# Patient Record
Sex: Male | Born: 1945 | Race: White | Hispanic: No | Marital: Married | Smoking: Former smoker
Health system: Southern US, Community
[De-identification: ages and names within clinical notes are randomized; demographics above are authoritative.]

## PROBLEM LIST (undated history)

## (undated) DIAGNOSIS — C61 Malignant neoplasm of prostate: Secondary | ICD-10-CM

## (undated) DIAGNOSIS — R011 Cardiac murmur, unspecified: Secondary | ICD-10-CM

## (undated) DIAGNOSIS — K589 Irritable bowel syndrome without diarrhea: Secondary | ICD-10-CM

## (undated) DIAGNOSIS — N200 Calculus of kidney: Secondary | ICD-10-CM

## (undated) DIAGNOSIS — R413 Other amnesia: Secondary | ICD-10-CM

## (undated) DIAGNOSIS — G4730 Sleep apnea, unspecified: Secondary | ICD-10-CM

## (undated) DIAGNOSIS — M1990 Unspecified osteoarthritis, unspecified site: Secondary | ICD-10-CM

## (undated) DIAGNOSIS — K219 Gastro-esophageal reflux disease without esophagitis: Secondary | ICD-10-CM

## (undated) DIAGNOSIS — T7840XA Allergy, unspecified, initial encounter: Secondary | ICD-10-CM

## (undated) DIAGNOSIS — F32A Depression, unspecified: Secondary | ICD-10-CM

## (undated) DIAGNOSIS — E785 Hyperlipidemia, unspecified: Secondary | ICD-10-CM

## (undated) DIAGNOSIS — F329 Major depressive disorder, single episode, unspecified: Secondary | ICD-10-CM

## (undated) DIAGNOSIS — K635 Polyp of colon: Secondary | ICD-10-CM

## (undated) DIAGNOSIS — H269 Unspecified cataract: Secondary | ICD-10-CM

## (undated) HISTORY — DX: Malignant neoplasm of prostate: C61

## (undated) HISTORY — DX: Unspecified cataract: H26.9

## (undated) HISTORY — DX: Polyp of colon: K63.5

## (undated) HISTORY — DX: Allergy, unspecified, initial encounter: T78.40XA

## (undated) HISTORY — PX: CARPAL TUNNEL RELEASE: SHX101

## (undated) HISTORY — DX: Gastro-esophageal reflux disease without esophagitis: K21.9

## (undated) HISTORY — PX: CERVICAL FUSION: SHX112

## (undated) HISTORY — DX: Calculus of kidney: N20.0

## (undated) HISTORY — PX: TONSILLECTOMY: SUR1361

## (undated) HISTORY — DX: Sleep apnea, unspecified: G47.30

## (undated) HISTORY — DX: Cardiac murmur, unspecified: R01.1

## (undated) HISTORY — DX: Hyperlipidemia, unspecified: E78.5

## (undated) HISTORY — DX: Depression, unspecified: F32.A

## (undated) HISTORY — PX: COLONOSCOPY: SHX174

## (undated) HISTORY — PX: HERNIA REPAIR: SHX51

## (undated) HISTORY — DX: Irritable bowel syndrome, unspecified: K58.9

## (undated) HISTORY — DX: Other amnesia: R41.3

## (undated) HISTORY — DX: Unspecified osteoarthritis, unspecified site: M19.90

---
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
## 2000-03-13 ENCOUNTER — Encounter (INDEPENDENT_AMBULATORY_CARE_PROVIDER_SITE_OTHER): Payer: Self-pay | Admitting: Specialist

## 2000-03-13 ENCOUNTER — Other Ambulatory Visit: Admission: RE | Admit: 2000-03-13 | Discharge: 2000-03-13 | Payer: Self-pay | Admitting: Urology

## 2000-08-04 HISTORY — PX: COLONOSCOPY W/ POLYPECTOMY: SHX1380

## 2001-07-12 ENCOUNTER — Encounter: Admission: RE | Admit: 2001-07-12 | Discharge: 2001-07-12 | Payer: Self-pay | Admitting: Internal Medicine

## 2001-07-12 ENCOUNTER — Encounter: Payer: Self-pay | Admitting: Internal Medicine

## 2001-07-13 ENCOUNTER — Encounter: Admission: RE | Admit: 2001-07-13 | Discharge: 2001-07-13 | Payer: Self-pay | Admitting: Internal Medicine

## 2001-07-13 ENCOUNTER — Encounter: Payer: Self-pay | Admitting: Internal Medicine

## 2001-08-04 DIAGNOSIS — C61 Malignant neoplasm of prostate: Secondary | ICD-10-CM

## 2001-08-04 HISTORY — DX: Malignant neoplasm of prostate: C61

## 2001-08-04 HISTORY — PX: PROSTATECTOMY: SHX69

## 2001-09-06 ENCOUNTER — Ambulatory Visit (HOSPITAL_BASED_OUTPATIENT_CLINIC_OR_DEPARTMENT_OTHER): Admission: RE | Admit: 2001-09-06 | Discharge: 2001-09-06 | Payer: Self-pay | Admitting: Internal Medicine

## 2001-10-07 ENCOUNTER — Encounter: Payer: Self-pay | Admitting: Neurosurgery

## 2001-10-08 ENCOUNTER — Ambulatory Visit (HOSPITAL_COMMUNITY): Admission: RE | Admit: 2001-10-08 | Discharge: 2001-10-08 | Payer: Self-pay | Admitting: Neurosurgery

## 2001-11-03 ENCOUNTER — Encounter: Payer: Self-pay | Admitting: Neurosurgery

## 2001-11-03 ENCOUNTER — Observation Stay (HOSPITAL_COMMUNITY): Admission: RE | Admit: 2001-11-03 | Discharge: 2001-11-04 | Payer: Self-pay | Admitting: Neurosurgery

## 2001-12-06 ENCOUNTER — Encounter: Admission: RE | Admit: 2001-12-06 | Discharge: 2001-12-06 | Payer: Self-pay | Admitting: Urology

## 2001-12-06 ENCOUNTER — Encounter: Payer: Self-pay | Admitting: Urology

## 2001-12-10 ENCOUNTER — Ambulatory Visit: Admission: RE | Admit: 2001-12-10 | Discharge: 2002-03-10 | Payer: Self-pay | Admitting: Radiation Oncology

## 2002-01-18 ENCOUNTER — Inpatient Hospital Stay (HOSPITAL_COMMUNITY): Admission: RE | Admit: 2002-01-18 | Discharge: 2002-01-21 | Payer: Self-pay | Admitting: Urology

## 2002-01-18 ENCOUNTER — Encounter (INDEPENDENT_AMBULATORY_CARE_PROVIDER_SITE_OTHER): Payer: Self-pay

## 2002-03-10 ENCOUNTER — Encounter: Admission: RE | Admit: 2002-03-10 | Discharge: 2002-03-10 | Payer: Self-pay | Admitting: Urology

## 2002-03-10 ENCOUNTER — Encounter: Payer: Self-pay | Admitting: Urology

## 2002-04-08 ENCOUNTER — Ambulatory Visit: Admission: RE | Admit: 2002-04-08 | Discharge: 2002-06-14 | Payer: Self-pay | Admitting: Radiation Oncology

## 2003-08-15 ENCOUNTER — Ambulatory Visit (HOSPITAL_COMMUNITY): Admission: RE | Admit: 2003-08-15 | Discharge: 2003-08-15 | Payer: Self-pay | Admitting: Urology

## 2003-11-16 ENCOUNTER — Encounter: Admission: RE | Admit: 2003-11-16 | Discharge: 2003-11-16 | Payer: Self-pay | Admitting: Internal Medicine

## 2004-04-16 ENCOUNTER — Ambulatory Visit (HOSPITAL_COMMUNITY): Admission: RE | Admit: 2004-04-16 | Discharge: 2004-04-17 | Payer: Self-pay | Admitting: Orthopedic Surgery

## 2004-06-14 ENCOUNTER — Ambulatory Visit: Payer: Self-pay | Admitting: Internal Medicine

## 2004-10-10 ENCOUNTER — Ambulatory Visit: Payer: Self-pay | Admitting: Internal Medicine

## 2005-02-14 ENCOUNTER — Ambulatory Visit: Payer: Self-pay | Admitting: Internal Medicine

## 2005-05-12 ENCOUNTER — Ambulatory Visit: Payer: Self-pay | Admitting: Internal Medicine

## 2005-06-20 ENCOUNTER — Ambulatory Visit: Payer: Self-pay | Admitting: Internal Medicine

## 2005-07-08 ENCOUNTER — Ambulatory Visit: Payer: Self-pay | Admitting: Gastroenterology

## 2005-07-08 ENCOUNTER — Ambulatory Visit: Payer: Self-pay | Admitting: Internal Medicine

## 2005-11-06 ENCOUNTER — Ambulatory Visit: Payer: Self-pay | Admitting: Internal Medicine

## 2006-01-20 ENCOUNTER — Ambulatory Visit: Payer: Self-pay | Admitting: Internal Medicine

## 2006-01-28 ENCOUNTER — Ambulatory Visit: Payer: Self-pay | Admitting: Internal Medicine

## 2006-02-12 ENCOUNTER — Ambulatory Visit: Payer: Self-pay | Admitting: Internal Medicine

## 2006-03-13 ENCOUNTER — Ambulatory Visit: Payer: Self-pay | Admitting: Gastroenterology

## 2006-03-27 ENCOUNTER — Ambulatory Visit: Payer: Self-pay | Admitting: Gastroenterology

## 2006-04-10 ENCOUNTER — Ambulatory Visit: Payer: Self-pay | Admitting: Internal Medicine

## 2006-08-04 HISTORY — PX: COLONOSCOPY: SHX174

## 2006-09-23 DIAGNOSIS — D649 Anemia, unspecified: Secondary | ICD-10-CM | POA: Insufficient documentation

## 2006-12-04 ENCOUNTER — Ambulatory Visit: Payer: Self-pay | Admitting: Internal Medicine

## 2007-02-10 ENCOUNTER — Telehealth (INDEPENDENT_AMBULATORY_CARE_PROVIDER_SITE_OTHER): Payer: Self-pay | Admitting: *Deleted

## 2007-03-23 ENCOUNTER — Encounter: Payer: Self-pay | Admitting: Internal Medicine

## 2007-03-23 DIAGNOSIS — G43819 Other migraine, intractable, without status migrainosus: Secondary | ICD-10-CM

## 2007-04-06 ENCOUNTER — Telehealth (INDEPENDENT_AMBULATORY_CARE_PROVIDER_SITE_OTHER): Payer: Self-pay | Admitting: *Deleted

## 2007-04-26 ENCOUNTER — Ambulatory Visit: Payer: Self-pay | Admitting: Internal Medicine

## 2007-05-03 ENCOUNTER — Telehealth: Payer: Self-pay | Admitting: Internal Medicine

## 2007-05-10 ENCOUNTER — Telehealth (INDEPENDENT_AMBULATORY_CARE_PROVIDER_SITE_OTHER): Payer: Self-pay | Admitting: *Deleted

## 2007-05-18 ENCOUNTER — Ambulatory Visit: Payer: Self-pay | Admitting: Internal Medicine

## 2007-05-18 DIAGNOSIS — F411 Generalized anxiety disorder: Secondary | ICD-10-CM | POA: Insufficient documentation

## 2007-05-19 ENCOUNTER — Ambulatory Visit: Payer: Self-pay | Admitting: Internal Medicine

## 2007-05-21 ENCOUNTER — Encounter (INDEPENDENT_AMBULATORY_CARE_PROVIDER_SITE_OTHER): Payer: Self-pay | Admitting: *Deleted

## 2007-06-03 ENCOUNTER — Encounter: Payer: Self-pay | Admitting: Internal Medicine

## 2007-08-26 ENCOUNTER — Telehealth (INDEPENDENT_AMBULATORY_CARE_PROVIDER_SITE_OTHER): Payer: Self-pay | Admitting: *Deleted

## 2007-08-30 ENCOUNTER — Ambulatory Visit: Payer: Self-pay | Admitting: Internal Medicine

## 2007-08-31 ENCOUNTER — Encounter (INDEPENDENT_AMBULATORY_CARE_PROVIDER_SITE_OTHER): Payer: Self-pay | Admitting: *Deleted

## 2007-08-31 LAB — CONVERTED CEMR LAB
Albumin: 4.1 g/dL (ref 3.5–5.2)
Alkaline Phosphatase: 44 units/L (ref 39–117)
HDL: 40.7 mg/dL (ref >39.0)
Total Bilirubin: 0.6 mg/dL (ref 0.3–1.2)
Total CHOL/HDL Ratio: 4.2
VLDL: 23 mg/dL (ref 0–40)

## 2007-10-28 ENCOUNTER — Telehealth (INDEPENDENT_AMBULATORY_CARE_PROVIDER_SITE_OTHER): Payer: Self-pay | Admitting: *Deleted

## 2007-11-03 ENCOUNTER — Encounter: Payer: Self-pay | Admitting: Internal Medicine

## 2007-11-04 ENCOUNTER — Telehealth (INDEPENDENT_AMBULATORY_CARE_PROVIDER_SITE_OTHER): Payer: Self-pay | Admitting: *Deleted

## 2007-11-11 ENCOUNTER — Encounter (INDEPENDENT_AMBULATORY_CARE_PROVIDER_SITE_OTHER): Payer: Self-pay | Admitting: *Deleted

## 2007-11-11 ENCOUNTER — Ambulatory Visit: Payer: Self-pay | Admitting: Internal Medicine

## 2007-11-11 DIAGNOSIS — Z8546 Personal history of malignant neoplasm of prostate: Secondary | ICD-10-CM

## 2007-11-11 DIAGNOSIS — G4730 Sleep apnea, unspecified: Secondary | ICD-10-CM | POA: Insufficient documentation

## 2007-11-11 DIAGNOSIS — G5600 Carpal tunnel syndrome, unspecified upper limb: Secondary | ICD-10-CM | POA: Insufficient documentation

## 2007-11-11 DIAGNOSIS — E785 Hyperlipidemia, unspecified: Secondary | ICD-10-CM | POA: Insufficient documentation

## 2007-11-11 DIAGNOSIS — K219 Gastro-esophageal reflux disease without esophagitis: Secondary | ICD-10-CM

## 2007-12-29 ENCOUNTER — Encounter: Payer: Self-pay | Admitting: Internal Medicine

## 2008-01-06 ENCOUNTER — Ambulatory Visit: Payer: Self-pay | Admitting: Family Medicine

## 2008-01-06 ENCOUNTER — Telehealth (INDEPENDENT_AMBULATORY_CARE_PROVIDER_SITE_OTHER): Payer: Self-pay | Admitting: *Deleted

## 2008-01-07 ENCOUNTER — Telehealth (INDEPENDENT_AMBULATORY_CARE_PROVIDER_SITE_OTHER): Payer: Self-pay | Admitting: *Deleted

## 2008-01-31 ENCOUNTER — Telehealth (INDEPENDENT_AMBULATORY_CARE_PROVIDER_SITE_OTHER): Payer: Self-pay | Admitting: *Deleted

## 2008-02-07 ENCOUNTER — Encounter: Payer: Self-pay | Admitting: Internal Medicine

## 2008-02-08 ENCOUNTER — Ambulatory Visit: Payer: Self-pay | Admitting: Internal Medicine

## 2008-04-03 ENCOUNTER — Telehealth (INDEPENDENT_AMBULATORY_CARE_PROVIDER_SITE_OTHER): Payer: Self-pay | Admitting: *Deleted

## 2008-04-12 ENCOUNTER — Ambulatory Visit: Payer: Self-pay | Admitting: Internal Medicine

## 2008-05-18 ENCOUNTER — Telehealth (INDEPENDENT_AMBULATORY_CARE_PROVIDER_SITE_OTHER): Payer: Self-pay | Admitting: *Deleted

## 2008-07-10 ENCOUNTER — Telehealth (INDEPENDENT_AMBULATORY_CARE_PROVIDER_SITE_OTHER): Payer: Self-pay | Admitting: *Deleted

## 2008-08-14 ENCOUNTER — Telehealth (INDEPENDENT_AMBULATORY_CARE_PROVIDER_SITE_OTHER): Payer: Self-pay | Admitting: *Deleted

## 2008-08-18 ENCOUNTER — Telehealth (INDEPENDENT_AMBULATORY_CARE_PROVIDER_SITE_OTHER): Payer: Self-pay | Admitting: *Deleted

## 2008-09-27 ENCOUNTER — Telehealth (INDEPENDENT_AMBULATORY_CARE_PROVIDER_SITE_OTHER): Payer: Self-pay | Admitting: *Deleted

## 2008-11-13 ENCOUNTER — Telehealth (INDEPENDENT_AMBULATORY_CARE_PROVIDER_SITE_OTHER): Payer: Self-pay | Admitting: *Deleted

## 2008-11-23 ENCOUNTER — Ambulatory Visit: Payer: Self-pay | Admitting: Internal Medicine

## 2008-11-23 DIAGNOSIS — Z860100 Personal history of colon polyps, unspecified: Secondary | ICD-10-CM | POA: Insufficient documentation

## 2008-11-23 DIAGNOSIS — K439 Ventral hernia without obstruction or gangrene: Secondary | ICD-10-CM | POA: Insufficient documentation

## 2008-11-25 LAB — CONVERTED CEMR LAB
Basophils Relative: 1 % (ref 0.0–3.0)
Calcium: 9.8 mg/dL (ref 8.4–10.5)
Cholesterol: 169 mg/dL (ref 0–200)
Creatinine, Ser: 1.2 mg/dL (ref 0.4–1.5)
Eosinophils Relative: 3.6 % (ref 0.0–5.0)
HDL: 42.1 mg/dL (ref >39.00)
Hemoglobin: 14.4 g/dL (ref 13.0–17.0)
LDL Cholesterol: 103 mg/dL — ABNORMAL HIGH (ref 0–99)
Lymphs Abs: 1.7 10*3/uL (ref 0.7–4.0)
Monocytes Absolute: 0.4 10*3/uL (ref 0.1–1.0)
Monocytes Relative: 9 % (ref 3.0–12.0)
Neutro Abs: 2.5 10*3/uL (ref 1.4–7.7)
Neutrophils Relative %: 51.6 % (ref 43.0–77.0)
Platelets: 231 10*3/uL (ref 150.0–400.0)
Potassium: 5 meq/L (ref 3.5–5.1)
Sodium: 143 meq/L (ref 135–145)
Total Bilirubin: 0.5 mg/dL (ref 0.3–1.2)
Total CHOL/HDL Ratio: 4
Total Protein: 7.4 g/dL (ref 6.0–8.3)
Triglycerides: 118 mg/dL (ref 0.0–149.0)
VLDL: 23.6 mg/dL (ref 0.0–40.0)

## 2008-11-27 ENCOUNTER — Ambulatory Visit: Payer: Self-pay | Admitting: Internal Medicine

## 2008-11-27 ENCOUNTER — Encounter (INDEPENDENT_AMBULATORY_CARE_PROVIDER_SITE_OTHER): Payer: Self-pay | Admitting: *Deleted

## 2008-11-28 ENCOUNTER — Encounter (INDEPENDENT_AMBULATORY_CARE_PROVIDER_SITE_OTHER): Payer: Self-pay | Admitting: *Deleted

## 2009-05-09 ENCOUNTER — Encounter: Payer: Self-pay | Admitting: Internal Medicine

## 2009-06-04 ENCOUNTER — Telehealth (INDEPENDENT_AMBULATORY_CARE_PROVIDER_SITE_OTHER): Payer: Self-pay | Admitting: *Deleted

## 2009-06-18 ENCOUNTER — Telehealth (INDEPENDENT_AMBULATORY_CARE_PROVIDER_SITE_OTHER): Payer: Self-pay | Admitting: *Deleted

## 2009-06-20 ENCOUNTER — Ambulatory Visit: Payer: Self-pay | Admitting: Internal Medicine

## 2009-06-22 ENCOUNTER — Encounter (INDEPENDENT_AMBULATORY_CARE_PROVIDER_SITE_OTHER): Payer: Self-pay | Admitting: *Deleted

## 2009-09-03 ENCOUNTER — Telehealth (INDEPENDENT_AMBULATORY_CARE_PROVIDER_SITE_OTHER): Payer: Self-pay | Admitting: *Deleted

## 2009-10-29 ENCOUNTER — Telehealth (INDEPENDENT_AMBULATORY_CARE_PROVIDER_SITE_OTHER): Payer: Self-pay | Admitting: *Deleted

## 2010-01-25 ENCOUNTER — Ambulatory Visit: Payer: Self-pay | Admitting: Internal Medicine

## 2010-01-29 ENCOUNTER — Ambulatory Visit: Payer: Self-pay | Admitting: Internal Medicine

## 2010-02-11 ENCOUNTER — Telehealth (INDEPENDENT_AMBULATORY_CARE_PROVIDER_SITE_OTHER): Payer: Self-pay | Admitting: *Deleted

## 2010-03-11 ENCOUNTER — Encounter: Payer: Self-pay | Admitting: Internal Medicine

## 2010-06-10 ENCOUNTER — Encounter: Payer: Self-pay | Admitting: Internal Medicine

## 2010-08-25 ENCOUNTER — Encounter: Payer: Self-pay | Admitting: Neurosurgery

## 2010-09-01 LAB — CONVERTED CEMR LAB
AST: 25 units/L (ref 0–37)
BUN: 21 mg/dL (ref 6–23)
Basophils Relative: 0.8 % (ref 0.0–3.0)
Bilirubin, Direct: 0.1 mg/dL (ref 0.0–0.3)
Calcium: 9.5 mg/dL (ref 8.4–10.5)
Chloride: 107 meq/L (ref 96–112)
Creatinine, Ser: 1.1 mg/dL (ref 0.4–1.5)
Eosinophils Absolute: 0.1 10*3/uL (ref 0.0–0.7)
Eosinophils Relative: 2.2 % (ref 0.0–5.0)
Glucose, Bld: 97 mg/dL (ref 70–99)
HDL goal, serum: 40 mg/dL
Hemoglobin: 13.9 g/dL (ref 13.0–17.0)
Lymphs Abs: 1.8 10*3/uL (ref 0.7–4.0)
Monocytes Absolute: 0.6 10*3/uL (ref 0.1–1.0)
Neutro Abs: 2.6 10*3/uL (ref 1.4–7.7)
Platelets: 210 10*3/uL (ref 150.0–400.0)
Potassium: 4.2 meq/L (ref 3.5–5.1)
RBC: 4.28 M/uL (ref 4.22–5.81)
Sodium: 143 meq/L (ref 135–145)
TSH: 0.73 microintl units/mL (ref 0.35–5.50)
Total Protein: 7.4 g/dL (ref 6.0–8.3)

## 2010-09-05 NOTE — Assessment & Plan Note (Signed)
Summary: cpx & lab/cbs   Vital Signs:  Patient profile:   65 year old male Height:      68 inches Weight:      178.8 pounds BMI:     27.28 Temp:     98.4 degrees F oral Pulse rate:   84 / minute Resp:     14 per minute BP sitting:   110 / 70  (left arm) Cuff size:   large  Vitals Entered By: Shonna Chock (January 25, 2010 2:13 PM)  CC: Lipid Management Comments REVIEWED MED LIST, PATIENT AGREED DOSE AND INSTRUCTION CORRECT    Primary Care Provider:  Laury Villegas  CC:  Lipid Management.  History of Present Illness: Luis Villegas is here for a physical; he is asymptomatic . His PSA was 0.72 last week by Luis Villegas.  Lipid Management History:      Positive NCEP/ATP III risk factors include male age 68 years old or older and family history for ischemic heart disease (females less than 66 years old).  Negative NCEP/ATP III risk factors include non-diabetic, non-tobacco-user status, non-hypertensive, no ASHD (atherosclerotic heart disease), no prior stroke/TIA, no peripheral vascular disease, and no history of aortic aneurysm.     Allergies (verified): No Known Drug Allergies  Past History:  Past Medical History: Anemia-NOS Hyperlipidemia :[place] Study  LDL goal = < 130 GERD Sleep apnea , PMH of , stable off CPAP Prostate cancer, hx of, Luis Villegas Colonic polyps, hx of  Past Surgical History: Colonoscopy & Endo both negative  2008, Luis Villegas Colon polypectomy 2002 Prostatectomy 2003 , Luis Villegas Tonsillectomy Carpal tunnel release, bilaterally , Luis Villegas Cervical fusion  Family History: Father: d bladder cancer  Mother: breast cancer, DM, neuropathy, valvular heart disease, MI pre 13 Siblings: bro: colon polyps,prostate cancer,valvular heart disease; bro: prostate cancer  Social History: Former Smoker: quit  1995 Alcohol use-no Regular exercise-yes: walking 30 min  2-3 X/week Married Retired  Review of Systems General:  Denies chills, fatigue, fever, sleep  disorder, sweats, and weight loss. Eyes:  Denies blurring, double vision, and vision loss-both eyes. ENT:  Denies difficulty swallowing; Intermittent hoasrseness. Tinnitus L ear. CV:  Denies chest pain or discomfort, difficulty breathing at night, difficulty breathing while lying down, leg cramps with exertion, lightheadness, near fainting, palpitations, shortness of breath with exertion, swelling of feet, and swelling of hands. Resp:  Complains of excessive snoring; denies cough, hypersomnolence, morning headaches, shortness of breath, sputum productive, and wheezing. GI:  Complains of indigestion; denies abdominal pain, bloody stools, and dark tarry stools; Meds help, overall improved. GU:  Denies discharge, dysuria, and hematuria; Intermittent incontinence; nocturia X 3. MS:  Complains of joint pain; denies joint redness, joint swelling, low back pain, mid back pain, and thoracic pain; Ankle discomfort  with  walking.  Physical Exam  General:  well-nourished, alert,appropriate and cooperative throughout examination Head:  Normocephalic and atraumatic without obvious abnormalities. No apparent alopecia  Eyes:  No corneal or conjunctival inflammation noted. Perrla. Funduscopic exam benign, without hemorrhages, exudates or papilledema.  Ears:  External ear exam shows no significant lesions or deformities.  Otoscopic examination reveals clear canals, tympanic membranes are intact bilaterally without bulging, retraction, inflammation or discharge. Hearing is grossly normal bilaterally. Nose:  External nasal examination shows no deformity or inflammation. Nasal mucosa are pink and moist without lesions or exudates. Mouth:  Oral mucosa and oropharynx without lesions or exudates.  Teeth in good repair. Neck:  No deformities, masses, or tenderness noted. Lungs:  Normal respiratory effort, chest expands symmetrically. Lungs are clear to auscultation, no crackles or wheezes. Heart:  Normal rate and  regular rhythm. S1 and S2 normal without gallop, murmur, click, rub .S4 Abdomen:  Bowel sounds positive,abdomen soft and non-tender without masses, organomegaly or hernias noted. Genitalia:  Luis Villegas  Msk:  No deformity or scoliosis noted of thoracic or lumbar spine.   Pulses:  R and L carotid,radial,dorsalis pedis and posterior tibial pulses are full and equal bilaterally Extremities:  No clubbing, cyanosis, edema, or deformity noted with normal full range of motion of all joints.   Neurologic:  alert & oriented X3.   Skin:  Intact without suspicious lesions or rashes Cervical Nodes:  No lymphadenopathy noted Axillary Nodes:  No palpable lymphadenopathy Psych:  memory intact for recent and remote, normally interactive, and good eye contact.     Impression & Recommendations:  Problem # 1:  ROUTINE GENERAL MEDICAL EXAM@HEALTH  CARE FACL (ICD-V70.0)  Orders: EKG w/ Interpretation (93000)  Problem # 2:  HYPERLIPIDEMIA (ICD-272.4)  His updated medication list for this problem includes:    Vytorin 10-40 Mg Tabs (Ezetimibe-simvastatin) .Marland Kitchen... 1 by mouth once daily  Problem # 3:  COLONIC POLYPS, HX OF (ICD-V12.72) as per Luis Villegas  Problem # 4:  SLEEP APNEA (ICD-780.57) Stable off CPAP  Problem # 5:  GERD (ICD-530.81)  The following medications were removed from the medication list:    Kapidex 60 Mg Cpdr (Dexlansoprazole) .Marland Kitchen... 1 by mouth once daily His updated medication list for this problem includes:    Omeprazole 20 Mg Cpdr (Omeprazole) .Marland Kitchen... Take 1 pill 30 minutes before breakfast  Problem # 6:  PROSTATE CANCER, HX OF (ICD-V10.46) as per Luis Villegas  Complete Medication List: 1)  Topamax 200 Mg Tabs (Topiramate) .Marland Kitchen.. 1 by mouth qd 2)  Wellbutrin Xl 300 Mg Tb24 (Bupropion hcl) .Marland Kitchen.. 1 by mouth once daily 3)  Acyclovir 400 Mg Tabs (Acyclovir) .Marland Kitchen.. 1 by mouth once daily 4)  Imitrex 100 Mg Tabs (Sumatriptan succinate) .... As needed 5)  Vytorin 10-40 Mg Tabs (Ezetimibe-simvastatin)  .Marland Kitchen.. 1 by mouth once daily 6)  Flexeril 10 Mg Tabs (Cyclobenzaprine hcl) .Marland Kitchen.. 1 by mouth tid 7)  Omeprazole 20 Mg Cpdr (Omeprazole) .... Take 1 pill 30 minutes before breakfast 8)  Fish Oil 1000 Mg Caps (Omega-3 fatty acids) .Marland Kitchen.. 1 by mouth once daily  Other Orders: Zoster (Shingles) Vaccine Live [phone]) Admin 1st Vaccine (60454)  Lipid Assessment/Plan:      Based on NCEP/ATP III, the patient's risk factor category is "2 or more risk factors and a calculated 10 year CAD risk of < 20%".  The patient's lipid goals are as follows: Total cholesterol goal is 200; LDL cholesterol goal is 130; HDL cholesterol goal is 40; Triglyceride goal is 150.  His LDL cholesterol goal has been met.    Patient Instructions: 1)  Schedule fasting labs: 2)  BMP ; 3)  Hepatic Panel ; 4)  NMR Lipoprofile Lipid Panel ; 5)  TSH ; 6)  CBC w/ Diff. 7)  Take an  81 mg COATED Aspirin every day with b'fast. Prescriptions: VYTORIN 10-40 MG TABS (EZETIMIBE-SIMVASTATIN) 1 by mouth once daily  #90 x 3   Entered and Authorized by:   Marga Melnick MD   Signed by:   Marga Melnick MD on 01/25/2010   Method used:   Printed then faxed to ...       [facility] [phone]* (retail)  [address]       [place], [place]  [address]       Ph: [phone] or [phone]       Fax: [phone]   RxID:   [id number]    Immunizations Administered:  Zostavax # 1:    Vaccine Type: Zostavax    Site: Right Arm    Mfr: Merck    Dose: 0.9mL    Route: Normal    Given by: Shonna Chock    Exp. Date: 02/27/2011    Lot #: [id number]

## 2010-09-05 NOTE — Progress Notes (Signed)
Summary: vomiting, bodyaches  Phone Note Call from Patient   Caller: Patient Summary of Call: pt c/o vomiting, and bodyaches x1day. pt denies any fever, or chills.  pt states that he has not had anything to eat since noon yesterday but has been drinking plenty of fluid. pt advise to push fluid, keep clear liquid diet, try broth and avoid dairy product, greasy/fatty foods. OVINB, pt advise if unable to keep anything down needs to be seen in ED for possible dehydration...................Marland KitchenFelecia Deloach CMA  September 03, 2009 8:38 AM

## 2010-09-05 NOTE — Progress Notes (Signed)
Summary: Refill Request  Phone Note Refill Request Message from:  Fax from Pharmacy on February 11, 2010 3:54 PM  Refills Requested: Medication #1:  citalopram 20 mg take one tab by mouth one time daily [facility] [address] - fax [phone]   Initial call taken by: Okey Regal Spring,  February 11, 2010 3:56 PM  Follow-up for Phone Call        Spoke with patient's wife and she is not sure of the situation with the Citalopram, patient was out getting a hair cut and she will have him call when he gets in  We have Citalopram removed from list and its indicated that patient was weaning off med (need to clarify) Follow-up by: Shonna Chock,  February 12, 2010 9:20 AM  Additional Follow-up for Phone Call Additional follow up Details #1::        patient called back - he said he is still taking med every night - if dr hopper wants him to wean off he needs to know how .Marland KitchenOkey Regal Spring  February 12, 2010 10:33 AM     Additional Follow-up for Phone Call Additional follow up Details #2::    Dr.Hopper please advise, patient is takin Citalopram and Wellbutrin, should patient continue both???   Shonna Chock CMA  February 12, 2010 11:42 AM   Additional Follow-up for Phone Call Additional follow up Details #3:: Details for Additional Follow-up Action Taken: Citalopram 20 mg once daily can be continued if he feels it has been of benefit #90  I spoke with patient and he feels it is of benefit and would like rx sent it, done./Chrae [facility] CMA  February 13, 2010 8:30 AM     New/Updated Medications: CITALOPRAM HYDROBROMIDE 20 MG TABS (CITALOPRAM HYDROBROMIDE) 1 by mouth once daily Prescriptions: CITALOPRAM HYDROBROMIDE 20 MG TABS (CITALOPRAM HYDROBROMIDE) 1 by mouth once daily  #90 x 1   Entered by:   Shonna Chock CMA   Authorized by:   Marga Melnick MD   Signed by:   Shonna Chock CMA on 02/13/2010   Method used:   Electronically to        [facility] [address] (retail)       [address]  [place], [place]  [address]       Ph: [phone]       Fax: [phone]   RxID:   [id number]

## 2010-09-05 NOTE — Letter (Signed)
Summary: [facility]  [facility]   Imported By: Lanelle Bal 03/18/2010 12:44:13  _____________________________________________________________________  External Attachment:    Type:   Image     Comment:   External Document

## 2010-09-05 NOTE — Progress Notes (Signed)
Summary: Refill Request  Phone Note Refill Request Message from:  Pharmacy on [facility] Fax #: [phone]  Refills Requested: Medication #1:  VYTORIN 10-40 MG TABS 1 by mouth once daily   Dosage confirmed as above?Dosage Confirmed   Supply Requested: 3 months   Notes: 2 refills Next Appointment Scheduled: none Initial call taken by: Harold Barban,  October 29, 2009 1:32 PM    New/Updated Medications: VYTORIN 10-40 MG TABS (EZETIMIBE-SIMVASTATIN) 1 by mouth once daily, ADDITIONAL REFILL REQUIRE AN APPOINTMENT Prescriptions: VYTORIN 10-40 MG TABS (EZETIMIBE-SIMVASTATIN) 1 by mouth once daily, ADDITIONAL REFILL REQUIRE AN APPOINTMENT  #90 x 0   Entered by:   Shonna Chock   Authorized by:   Marga Melnick MD   Signed by:   Shonna Chock on 10/29/2009   Method used:   Faxed to ...       [facility]* (mail-order)             ,          Ph: [phone]       Fax: [phone]   RxID:   [id number]

## 2010-09-05 NOTE — Letter (Signed)
Summary: [facility]  [facility]   Imported By: Lanelle Bal 03/29/2010 09:04:25  _____________________________________________________________________  External Attachment:    Type:   Image     Comment:   External Document

## 2010-09-09 ENCOUNTER — Encounter: Payer: Self-pay | Admitting: Internal Medicine

## 2010-09-19 NOTE — Letter (Signed)
Summary: [facility]  [facility]   Imported By: Maryln Gottron 09/13/2010 09:54:07  _____________________________________________________________________  External Attachment:    Type:   Image     Comment:   External Document

## 2010-09-25 NOTE — Letter (Signed)
Summary: [facility]  [facility]   Imported By: Maryln Gottron 09/18/2010 15:16:43  _____________________________________________________________________  External Attachment:    Type:   Image     Comment:   External Document

## 2010-11-08 ENCOUNTER — Encounter: Payer: Self-pay | Admitting: Internal Medicine

## 2010-11-13 ENCOUNTER — Encounter: Payer: Self-pay | Admitting: Internal Medicine

## 2010-11-13 ENCOUNTER — Ambulatory Visit (INDEPENDENT_AMBULATORY_CARE_PROVIDER_SITE_OTHER): Payer: 59 | Admitting: Internal Medicine

## 2010-11-13 DIAGNOSIS — E785 Hyperlipidemia, unspecified: Secondary | ICD-10-CM

## 2010-11-13 DIAGNOSIS — R1013 Epigastric pain: Secondary | ICD-10-CM

## 2010-11-13 DIAGNOSIS — K219 Gastro-esophageal reflux disease without esophagitis: Secondary | ICD-10-CM

## 2010-11-13 DIAGNOSIS — K3189 Other diseases of stomach and duodenum: Secondary | ICD-10-CM

## 2010-11-13 DIAGNOSIS — R195 Other fecal abnormalities: Secondary | ICD-10-CM

## 2010-11-13 DIAGNOSIS — Z860100 Personal history of colon polyps, unspecified: Secondary | ICD-10-CM

## 2010-11-13 DIAGNOSIS — K921 Melena: Secondary | ICD-10-CM

## 2010-11-13 DIAGNOSIS — Z8546 Personal history of malignant neoplasm of prostate: Secondary | ICD-10-CM

## 2010-11-13 LAB — HEPATIC FUNCTION PANEL: Total Bilirubin: 0.9 mg/dL (ref 0.3–1.2)

## 2010-11-13 LAB — CBC WITH DIFFERENTIAL/PLATELET
Basophils Relative: 0.9 % (ref 0.0–3.0)
Eosinophils Absolute: 0.1 10*3/uL (ref 0.0–0.7)
Eosinophils Relative: 2.6 % (ref 0.0–5.0)
Lymphocytes Relative: 31.7 % (ref 12.0–46.0)
Lymphs Abs: 1.7 10*3/uL (ref 0.7–4.0)
Monocytes Absolute: 0.5 10*3/uL (ref 0.1–1.0)
Monocytes Relative: 10.1 % (ref 3.0–12.0)
Neutro Abs: 2.9 10*3/uL (ref 1.4–7.7)
Platelets: 244 10*3/uL (ref 150.0–400.0)
RDW: 14.4 % (ref 11.5–14.6)

## 2010-11-13 LAB — LIPID PANEL: VLDL: 19.2 mg/dL (ref 0.0–40.0)

## 2010-11-13 NOTE — Progress Notes (Signed)
  Subjective:    Patient ID: Luis Villegas, male    DOB: 1945-12-03, 65 y.o.   MRN: [id number]  HPI  Indigestion: Onset: 3 weeks ago   Severity: 5 or 6 on 10 scale Quality: cramping Duration: lasts hours @ times Better with: Pepto Bismol Worse with: greasy food Note : PPI taken daily  Symptoms: Nausea/Vomiting: yes, nausea w/o vomiting ; increased burping. No dysphagia Diarrhea: no  Constipation: no  Melena/BRBPR: yes, from Pepto Bismol  Hematemesis: no  Anorexia: no  Fever/Chills: no  Dysuria/hematuria/ pyuria: no  Wt loss: no  EtOH use: no  NSAIDs/ASA: yes, Aleve 5-6 in past 3 weeks    Past Surgeries: Endoscopy neg 2008; PMH of polypectomy X 1, neg last colonoscopy       Review of Systems: no exertional chest pain . He is worried about recurrent cancer. He is seeing Dr Earlene Plater @ [facility]     Objective:   Physical Exam General appearance is one of good health and nourishment. No lymphadenopathy about the head, neck, or axilla. See current vital signs Eye - Pupils Equal Round Reactive to light, Conjunctiva without redness or discharge. No icterus Oral exam: Dental hygiene is good; lips and gums are healthy appearing.There is no oropharyngeal erythema or exudate noted. Heart:  Normal rate and regular rhythm. S1 and S2 normal without gallop,  click, rub .Grade 1 basilar systolic murmur                                                                                                      Lungs:Chest clear to auscultation; no wheezes, rhonchi,rales ,or rubs present.No increased work of breathing. Bowel sounds are normal. Abdomen is soft and nontender with no organomegaly, hernias  or masses. Skin:  Intact without suspicious lesions or rashes . No jaundice.       Assessment & Plan:  #1 he describes dyspepsia despite a protein pump inhibitor daily; a major factor here appears to be stress. He is worried about recurrent cancer. Historically the symptoms did not suggest malignancy  but uncontrolled dyspepsia.  #2 he describes dark stool but he is on Pepto-Bismol.  #3 dyslipidemia; he is asking for monitor of early lipid status today.  Plan #1 he'll be asked to take the omeprazole 20 minutes before breakfast and evening meal. He'll also be asked to limit the triggers for dyspepsia. Stool cards will be collected along with a CBC and differential.  #2 lipids and hepatic panel will be collected.

## 2010-11-13 NOTE — Patient Instructions (Signed)
The triggers for dyspepsia include stress; the aspirin "family"; alcohol; peppermint; and caffeine (coffee, tea, cola, and chocolate). The aspirin family would include aspirin and the nonsteroidal agents such as ibuprofen &  Naproxen. Tylenol would not cause reflux.   Please complete the stool cards. Increase the omeprazole to one pill 20-30 minutes before breakfast and also before the evening meal.

## 2010-12-04 ENCOUNTER — Other Ambulatory Visit (INDEPENDENT_AMBULATORY_CARE_PROVIDER_SITE_OTHER): Payer: 59

## 2010-12-04 DIAGNOSIS — Z1211 Encounter for screening for malignant neoplasm of colon: Secondary | ICD-10-CM

## 2010-12-04 LAB — HEMOCCULT GUIAC POC 1CARD (OFFICE)
Card #2 Fecal Occult Blod, POC: NEGATIVE
Fecal Occult Blood, POC: NEGATIVE

## 2010-12-20 NOTE — Op Note (Signed)
[facility]  Patient:    Luis Villegas, Luis Villegas Visit Number: [id number] MRN: [id number]          Service Type: SUR Location: 3W [id number] 01 Attending Physician:  Tania Ade Dictated by:   Lucrezia Starch. Ovidio Hanger, M.D. Proc. Date: 01/18/02 Admit Date:  01/18/2002                             Operative Report  PREOPERATIVE DIAGNOSIS:  Adenocarcinoma of the prostate.  POSTOPERATIVE DIAGNOSIS:  Adenocarcinoma of the prostate.  OPERATION PERFORMED:  Radical retropubic prostatectomy.  SURGEON:  Lucrezia Starch. Ovidio Hanger, M.D.  ASSISTANT:  Excell Seltzer. Annabell Howells, M.D.  ANESTHESIA:  General endotracheal.  ESTIMATED BLOOD LOSS:  350 cc.  TUBES:  22 [place] Foley, large flat Blake drain.  COMPLICATIONS:  None.  INDICATIONS FOR PROCEDURE:  Luis Villegas is a very nice 65 year old white male who was found to have an elevated PSA of 7.29. He subsequently underwent transrectal ultrasound and biopsy of the prostate which revealed a Gleason score of 7 which was 3+4 adenocarcinoma in 20% of the biopsies on the right side of the prostate. He has considered all options carefully and after understanding the risks, benefits, and alternatives has elected to proceed with radical retropubic prostatectomy.  DESCRIPTION OF PROCEDURE:  The patient was placed in supine position, after appropriate general endotracheal anesthesia was performed was prepped and draped with Betadine in a sterile fashion. A lower midline vertical abdominal incision was made, sharp dissection was carried down through the subcutaneous tissue. The anterior rectus sheath was incised in the linear alba. The rectus muscle bellies were retracted laterally and the space of Retzius was entered. He was noted to be somewhat obese but bilateral pelvic lymphadenectomy was performed and no significant lymphadenopathy was noted. A Bookwalter retractor was placed and the external iliac hypogastric and obturator lymph  nodes were dissected, lymphatics were clipped with hemlock clips and the specimen submitted for permanent section. The obturator nerve was identified and protected bilaterally. Following this, the endopelvic fascia was perforated bilaterally, there was a large superficial dorsal vein in the penis that traversed the upper portion on the actual posterior pubic bone and this was avoided. No other superficial dorsal vein of the penis was noted. The puboprostatic ligaments were then sharply incised and the deep dorsal vein of the penis was circled with a Hoenfeltner retractor and ligated with #1 Vicryl suture. One ampule of indigo carmine was given IV. The back bleeders were oversewn with 2-0 chromic catgut figure-of-eight stitches and the dorsal vein complex was incised sharply with Bovie coagulation cautery approaching the pillars of the prostate. The apex of the prostate was noted to be densely adherent to Denonvilliers fascia in the rectum posteriorly and apically. Careful dissection was performed and no rectal injuries were noted although again there was dense tissue left and right side apically that had to be carefully teased away and there was thoughts of significant scarring versus tumor in these region. The apex of the prostate was then lifted up. The transversalis fascia was resected out posteriorly. The pedicles of the prostate were taken in serial packets with right angles and hemlock clips. The transversalis fascia was incised posteriorly, the seminal vesicles and the vas deferens were dissected out, clipped and cut and the bladder neck was approached. It was sharply incised with Bovie coagulation cautery. The ureteral orifices were identified and protected and the posterior portion of the  prostate was dissected free and submitted to pathology. Thorough irrigation was performed, good hemostasis was noted to be present. The mucosa was plicated to the serosa of the bladder with  interrupted 5-0 chromic catgut and a tennis racquet-type closure was performed with running 3-0 chromic catgut to maintain approximately a 5 mm bladder neck. A 22 French 10 cc balloon catheter was placed and the urethrovesical anastomosis was performed with 2-0 Vicryl sutures and UR-5 needles. Stitches were placed in the 5 oclock, 7 oclock, 3 oclock, 9 oclock, and 12 oclock positions and were mucosa to mucosa. A good urethra was noted to be present. A #1 Prolene was placed through the eyelet of the catheter exiting the bladder and sutured over a button and 15 cc were placed in the balloon and the anastomosis was completed. It was irrigated and noted to be water tight. A large flat Blake drain was placed through a left stab incision and sutured in place with a silk suture. Thorough irrigation was performed, good hemostasis noted to be present. The rectus muscle bellies were approximated in the midline with interrupted #0 chromic catgut and the fascia was closed with running #1 PDS suture. Thorough irrigation was performed in the subcutaneous tissue with antibiotic solution and the skin was closed with skin staples and the patient was taken to the recovery room stable. Dictated by:   Lucrezia Starch. Ovidio Hanger, M.D. Attending Physician:  Tania Ade DD:  01/18/02 TD:  01/19/02 Job: [id number]

## 2010-12-20 NOTE — Discharge Summary (Signed)
[facility]  Patient:    Luis Villegas, Luis Villegas Visit Number: [id number] MRN: [id number]          Service Type: SUR Location: 3W [id number] 01 Attending Physician:  Tania Ade Dictated by:   Lucrezia Starch. Ovidio Hanger, M.D. Admit Date:  01/18/2002 Discharge Date: 01/21/2002                             Discharge Summary  DIAGNOSIS:  Adenocarcinoma of the prostate.  OPERATIVE PROCEDURE:  Radical retropubic prostatectomy on 01/18/02.  HISTORY OF PRESENT ILLNESS:  The patient is a very nice 65 year old white male who presented with an elevated PSA of 7.29, and subsequently underwent transductal ultrasound and biopsy of the prostate which revealed a Gleason score 7 which was 3+4 adenocarcinoma 20% of the biopsy from the right side of the prostate.  His metastatic workup was negative.  He understood risks, methods, and alternatives, he elected to proceed with radical retropubic prostatectomy.  Past medical history, social history, family history, review of systems, please see history and physical for full details.  PHYSICAL EXAMINATION:  VITAL SIGNS:  Afebrile, vital signs stable.  GENERAL:  Well-developed, well-nourished, well-groomed.  HEENT:  Pupils are equal, round and reactive to light and accommodation.  Nose and throat are clear.  NECK:  Without masses, thyromegaly, or bruits.  CHEST:  Clear anterior and posterior without rales or rhonchi.  ABDOMEN:  Soft, nontender, no masses, organomegaly, or hernia.  EXTREMITIES:  Normal.  NEUROLOGIC:  Intact.  SKIN:  Normal.  GENITOURINARY:  Prostate is 20 g and slightly firm.  HOSPITAL COURSE:  The patient was admitted after undergoing proper preoperative evaluation, was subsequently taken to surgery on 01/18/02, and underwent a radical retropubic prostatectomy uneventfully.  Postoperatively, initially he did well.  His postoperative labs were stable.  By 01/19/02, he was tolerating liquids without  flatus, abdomen was soft, Blake output had decreased, hemoglobin was 10.5, hematocrit 30.6, white blood cell count was 7.5, and BMET was essentially normal.  He was ambulated.  By postoperative day #2, 01/20/02, he continued to do well.  Temperature max was 100.1 degrees Fahrenheit, abdomen was soft, he was tolerating clear liquids.  His pathology which was received at that time was discussed in detail.  It was noted to be a PT2 X PN0 PMX Gleason score 7, which was 3+4, and we discussed the margin status in detail.  His diet was advanced.  By 01/21/02, he continued to do well.  The drainage was slightly increased, so the drain was converted to a passive draining bag.  He was discharged home.  DISCHARGE MEDICATIONS: 1. Cipro. 2. Ditropan. 3. Tylox.  CONDITION ON DISCHARGE:  Improved.  FOLLOWUP:  The following week for staple removal and drain removal, and specific instructions were given. Dictated by:   Lucrezia Starch. Ovidio Hanger, M.D. Attending Physician:  Tania Ade DD:  02/08/02 TD:  02/10/02 Job: [id number]

## 2010-12-20 NOTE — H&P (Signed)
[facility]  Patient:    Luis Villegas, Luis Villegas Visit Number: [id number] MRN: [id number]          Service Type: SUR Location: 3W 0372 01 Attending Physician:  Tania Ade Dictated by:   Lucrezia Starch. Ovidio Hanger, M.D. Admit Date:  01/18/2002                           History and Physical  CHIEF COMPLAINT:  "I have prostate cancer."  HISTORY OF PRESENT ILLNESS:  The patient is a very nice 65 year old white male who presented with an elevated PSA of 7.29 and subsequently underwent transrectal ultrasound and biopsy of the prostate which revealed a Gleason score 7 which was 3 + 4 adenocarcinoma on 20% of the biopsy from the right side of the prostate.  His metastatic workup has been essentially negative. After understanding the risks, benefits, and alternatives, he has elected to proceed with radical retropubic prostatectomy.  ALLERGIES:  MORPHINE intolerance.  MEDICATIONS:  Zocor, Nexium, minocycline, tramadol, acyclovir for herpes, and Imitrex as needed.  PAST MEDICAL HISTORY: 1. Some gastroesophageal reflux. 2. Right shoulder and hip pain due to arthritic changes. 3. Of note, he does have sleep apnea that has not been terribly severe. 4. Migraine headaches.  PAST SURGICAL HISTORY: 1. Neck surgery in April 2003. 2. Right thumb surgery. 3. Hernia repair. 4. Surgery on both feet.  SOCIAL HISTORY:  Negative drinker.  He smoked two packs per day of cigarettes for 25 years and quit eight years ago.  FAMILY HISTORY:  He has two brothers with prostate cancer.  Otherwise, not pertinent.  REVIEW OF SYSTEMS:  No shortness of breath, dyspnea on exertion, chest pain, or GI complaints.  PHYSICAL EXAMINATION:  VITAL SIGNS:  Blood pressure 110/60, pulse 80, respirations 20, temperature 98.5 degrees Fahrenheit.  GENERAL:  Well-nourished, well-developed, well-groomed.  He is in no acute distress.  HEENT:  PERRLA.  Ears, nose, throat  clear.  NECK:  Without masses, thyromegaly, or bruits.  CHEST:  Clear anteriorly and posteriorly, without rales or rhonchi.  ABDOMEN:  Soft and nontender.  Without masses, organomegaly, or hernias.  EXTREMITIES:  Normal.  NEUROLOGIC:  Intact.  SKIN:  Normal.  GENITOURINARY:  Penis:  No ______ , axilla, or groin adenopathy.  Penis, meatus, scrotum, testicles, adnexa, anus, perineum are normal.  RECTAL:  ______ prostate is 25 g, slightly firm.  IMPRESSION:  Clinical stage T1C adenocarcinoma of the prostate.  PLAN:  Radical retropubic prostatectomy. Dictated by:   Lucrezia Starch. Ovidio Hanger, M.D. Attending Physician:  Tania Ade DD:  01/18/02 TD:  01/19/02 Job: [id number]

## 2010-12-20 NOTE — Op Note (Signed)
[place]  Patient:    Luis Villegas, Luis Villegas Visit Number: [id number] MRN: [id number]          Service Type: SUR Location: 3000 3018 01 Attending Physician:  Danella Penton Dictated by:   Tanya Nones. Jeral Fruit, M.D. Proc. Date: 11/03/01 Admit Date:  11/03/2001 Discharge Date: 11/04/2001                             Operative Report  PREOPERATIVE DIAGNOSIS:  C5-6, C6-7 spondylosis, herniated disk.  POSTOPERATIVE DIAGNOSIS:  C5-6, C6-7 spondylosis, herniated disk.  PROCEDURES:  Anterior C5-6, C6-7 diskectomy, microscope, decompression of spinal cord, foraminotomy, interbody fusion, plate, microscope.  SURGEON:  Tanya Nones. Jeral Fruit, M.D.  ASSISTANTMena Goes. Franky Macho, M.D.  CLINICAL HISTORY:  Mr. Careaga is a a gentleman who has been complaining of neck pain with radiation to the upper extremities, associated with weakness. The patient was scheduled for surgery several weeks ago, but he had an upper respiratory infection.  Today he is feeling better, and he is ready to go ahead with surgery.  The risks were explained in the history and physical.  DESCRIPTION OF PROCEDURE:  The patient was taken to the OR and after intubation, the left side of the neck was prepped with Betadine.  A transverse incision was made through the skin and platysma.  Dissection was carried out until we found the cervical spine.  X-ray showed that we were at the level of 5-6.  There were two anterior osteophytes at the level of 5-6, 6-7, which were removed with the Leksell.  Then the anterior ligament was opened at those two levels.  With the microscope we did a total gross diskectomy.  We brought the drill into the area to drill the foramina, which were quite narrow secondary to spondylosis.  This procedure was done bilaterally at the level of 5-6, 6-7. Then we opened the posterior ligament and a 5-6 we found herniated disk going bilaterally, mostly going to the right  side.  Incision was made, and foraminotomy was accomplished.  The same procedure was done at the level of 6-7, where we found quite a bit of spondylosis.  Then the end plates were drilled and a piece of bone graft, one of 7 mm at the level of 5-6 and the other one of 8 mm was inserted at the level of C6-7.  This was followed by a a plate using five screws.  A lateral C-spine showed good position of the graft and the plate.  From then on the area was irrigated.  Hemostasis was done with bipolar.  The wound was closed with Vicryl and a Steri-Strip. Dictated by:   Tanya Nones. Jeral Fruit, M.D. Attending Physician:  Danella Penton DD:  11/03/01 TD:  11/04/01 Job: [id number]

## 2010-12-20 NOTE — Op Note (Signed)
NAME:  Luis Villegas, Luis Villegas                      ACCOUNT NO.:  [id number]   MEDICAL RECORD NO.:  [id number]                   PATIENT TYPE:  OIB   LOCATION:  5015                                 FACILITY:  [facility]   PHYSICIAN:  Burnard Bunting, M.D.                 DATE OF BIRTH:  [birth year]   DATE OF PROCEDURE:  04/16/2004  DATE OF DISCHARGE:                                 OPERATIVE REPORT   PREOPERATIVE DIAGNOSIS:  Right shoulder superior labrum anterior and  posterior tear, biceps tendinitis and impingement.   POSTOPERATIVE DIAGNOSIS:  Right shoulder superior labrum anterior and  posterior tear, biceps tendinitis and impingement.   OPERATION PERFORMED:  Right shoulder diagnostic and operative arthroscopy  with biceps tendon release and debridement with subsequent subacromial  decompression and open biceps tenodesis.   SURGEON:  Burnard Bunting, M.D.   ASSISTANT:  Jerolyn Shin. Lavender, M.D.   ESTIMATED BLOOD LOSS:  50 mL.   DRAINS:  None.   OPERATIVE FINDINGS:  1.  Examination under anesthesia, range of motion external rotation 15      degrees abduction, 70 degrees forward flexion, 180, external rotation 90      degrees of abduction, 90 internal rotation 70.  2.  Diagnostic arthroscopy (1) Type 2 shoulder superior labrum anterior and      posterior tear with attrition and greater than 50% tearing of the biceps      tendon.  (2) Intact rotator cuff.  (3) Intact glenohumeral articular      surface.  (4)  Intact anterior and posterior inferior glenohumeral      ligaments.  (5) Subacromial bursitis.   ANESTHESIA:  General endotracheal.   DESCRIPTION OF PROCEDURE:  The patient was brought to the operating room  where general endotracheal anesthesia was induced.  Preoperative intravenous  antibiotics were administered.  The right arm and hand were prepped with  DuraPrep solution and draped in a sterile manner.  Topographic anatomy  shoulder was identified including the posterolateral  and anterior margin of  the acromion as well as coracoid process, 20 mL of saline and epinephrine  were injected into the subacromial space. Saline injection was then  performed to the joint.  The scope was then placed into the joint from the  posterior portal which was 2 cm medial and inferior to the posterolateral  margin of the acromion.  Diagnostic arthroscopy was performed, anterior  portal was created under direct visualization with spinal needle.  The  patient had a type 2 SLAP tear and attritional tearing of the biceps tendon  greater than 50%.  Biceps tendon was released with the ArthroCare wand.  The  superior labrum was extensively debrided.  The patient also had what  appeared to be some early synovitis in the rotator interval which was also  debrided.  Rotator cuff had partial tearing, less than 10% off the foot  print which was also debrided.  Following debridement assessment of the  articular surface was made which were intact.  The anterior inferior and  posterior inferior labrum was also intact.  At this time the scope was  placed into the subacromial space.  Lateral portal was created off the  anterolateral aspect of the acromion.  Subacromial bursectomy and  decompression was performed.  Extensive bursitis was visualized.  At this  time no rotator cuff tear was seen on the bursal surface.  At this time  instruments were removed from the portals, anterior and posterior portal  were closed using 2-0 nylon suture.  Collier Flowers was used to cover the operative  field.  A 3 cm incision was made off the anterolateral margin of the  acromion.  Skin was split.  Deltoid was split down a measured distance of 3  cm which was then marked with #1 Vicryl suture.  The transverse humeral  ligament was then incised with the Arthrex retractor in place.  Biceps  tendon was identified and was tagged in modified [place] fashion with a #2  FiberWire suture. Then using a 7 x 23 mm bioabsorbable  interference screw,  biceps tenodesis was performed.  Transverse humeral ligament was then closed  over the bicipital groove using 2-0 Vicryl suture.  Deltoid split was then  reapproximated using #1 Vicryl suture followed by interrupted inverted 2-0  Vicryl sutures and a running 3-0 pullout Prolene.  Copious irrigation was  performed prior to closure.  Steri-Strips and Xeroform were applied.  The  patient tolerated the procedure well.  Bulky dressing was applied with a  sling.  The patient tolerated the procedure well without immediate  complications.                                               Burnard Bunting, M.D.    GSD/MEDQ  D:  04/17/2004  T:  04/17/2004  Job:  [id number]

## 2010-12-20 NOTE — H&P (Signed)
[place]  Patient:    Luis Villegas, Luis Villegas Visit Number: [id number] MRN: [id number]          Service Type: SUR Location: [facility] 2899 26 Attending Physician:  Danella Penton Dictated by:   Tanya Nones. Jeral Fruit, M.D. Admit Date:  10/08/2001 Discharge Date: 10/08/2001                           History and Physical  HISTORY OF PRESENT ILLNESS:  Mr. Vogelsang is a gentleman who was seen by me initially at the end of last year complaining of neck and right upper extremity which was getting worse.  The patient had infiltration of the shoulder which did not improve, and because of continuation of the pain associated with numbness and tingling sensation in the hand, he had an MRI was was sent to [place] for evaluation.  He was scheduled for surgery about two weeks ago but he developed an upper respiratory disease and we had to cancel.  Now, he is afebrile and he is ready.  PAST MEDICAL HISTORY:  Inguinal hernia repair and skin cancer.  ALLERGIES:  He is not allergic to any medications.  SOCIAL HISTORY:  Negative.  FAMILY HISTORY:  His mother is 4 with diabetes and cardiovascular disease.  REVIEW OF SYSTEMS:  Positive for headaches, kidney stone, high cholesterol, and neck pain; also, a skin cancer.  MEDICATIONS:  He is taking Nexium, Zocor, Imitrex, and imipramine.  PHYSICAL EXAMINATION:  HEENT:  Normal.  NECK:  He is able to flex, but extension and lateralization produces pain downgoing to the shoulders.  LUNGS:  Clear.  HEART:  Heart sounds normal.  ABDOMEN:  Normal.  EXTREMITIES:  Normal pulses.  NEUROLOGIC:  Mental status normal.  Cranial nerves normal.  Strength is 5/5 except that I can break both biceps and both wrist extensors, as well as the triceps.  Deltoid is normal.  Lower extremities normal.  Reflexes 2+, no Babinski.  Coordination normal.  Sensory is normal though he complains of tingling sensation in the  hand.  LABORATORY DATA:  The MRI showed that indeed he has a herniated disk and spondylosis at the level 5-6 and 6-7 with foraminal compromise bilaterally.  CLINICAL IMPRESSION: 1. 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 2. Bursitis of the shoulder. 3. Migraine.  RECOMMENDATIONS:  The patient is being admitted for an anterior cervical diskectomy at the level 5-6 and 6-7 with bone graft and plate.  He knows about the risks, such as infection, CSF leak, worsening of the pain, paralysis, need of further surgery.  The patient declined more opinions. Dictated by:   Tanya Nones. Jeral Fruit, M.D. Attending Physician:  Danella Penton DD:  11/03/01 TD:  11/03/01 Job: [id number]

## 2011-01-08 ENCOUNTER — Other Ambulatory Visit: Payer: Self-pay | Admitting: Internal Medicine

## 2011-01-23 ENCOUNTER — Other Ambulatory Visit: Payer: Self-pay | Admitting: Internal Medicine

## 2011-02-03 ENCOUNTER — Other Ambulatory Visit: Payer: Self-pay | Admitting: Internal Medicine

## 2011-02-25 ENCOUNTER — Other Ambulatory Visit: Payer: Self-pay | Admitting: Internal Medicine

## 2011-02-25 MED ORDER — ACYCLOVIR 200 MG PO CAPS: ORAL_CAPSULE | ORAL | Status: DC

## 2011-02-25 NOTE — Telephone Encounter (Signed)
RX sent to pharmacy for 200mg  cap

## 2011-02-25 NOTE — Telephone Encounter (Signed)
Patient wants new rx sent to local pharmacy - [facility] [address] - he is now getting zovirax 400 mg & he cuts it in half - patient wants a 200 mg (capsule form not pill)

## 2011-04-09 ENCOUNTER — Other Ambulatory Visit: Payer: Self-pay | Admitting: Internal Medicine

## 2011-04-09 MED ORDER — EZETIMIBE-SIMVASTATIN 10-40 MG PO TABS: 1.0000 | ORAL_TABLET | Freq: Every day | ORAL | Status: DC

## 2011-04-09 NOTE — Telephone Encounter (Signed)
RX sent to pharmacy  

## 2011-07-21 ENCOUNTER — Other Ambulatory Visit: Payer: Self-pay | Admitting: Internal Medicine

## 2011-07-22 MED ORDER — OMEPRAZOLE 20 MG PO CPDR: DELAYED_RELEASE_CAPSULE | ORAL | Status: DC

## 2011-07-22 NOTE — Telephone Encounter (Signed)
Rx sent 

## 2011-08-18 ENCOUNTER — Other Ambulatory Visit: Payer: Self-pay | Admitting: Internal Medicine

## 2011-08-26 ENCOUNTER — Other Ambulatory Visit: Payer: Self-pay

## 2011-08-26 MED ORDER — BUPROPION HCL ER (XL) 300 MG PO TB24: ORAL_TABLET | ORAL | Status: DC

## 2011-09-04 ENCOUNTER — Telehealth: Payer: Self-pay | Admitting: Internal Medicine

## 2011-09-04 NOTE — Telephone Encounter (Signed)
I left message informing patient that he needs fasting labs in April with an OV with Dr. Alwyn Ren 2-3 later  BMET, Lipids, hepatic panel, CBC & dif, TSH (242.4, 995.20)

## 2011-09-10 ENCOUNTER — Telehealth: Payer: Self-pay | Admitting: *Deleted

## 2011-09-10 NOTE — Telephone Encounter (Signed)
Per Dr Alwyn Ren Pt given 6 weeks of med samples will f/u with labs then discuss at OV other med options. No need to go forth with PA now.

## 2011-09-10 NOTE — Telephone Encounter (Signed)
Pt insurance is requiring PA. Pt has not tried any other med beside vytorin. Please advise how you would like to proceed with matter.

## 2011-10-25 ENCOUNTER — Other Ambulatory Visit: Payer: Self-pay | Admitting: Internal Medicine

## 2011-10-27 NOTE — Telephone Encounter (Signed)
Med was filled in Jan #90/1 refill (which would indicated refills not due until June 2013). I called the pharmacy and they were confused (pharmacist I spoke with indicated she was filling in and could not figure out why more refills requested at this time) I instructed pharmacist i will f/u with patient    Spoke with patient and he states at time he takes 2 pills. Patient with pending appointment on 11/20/2011. Patient will futher discuss at pending appointment.

## 2011-10-31 ENCOUNTER — Other Ambulatory Visit: Payer: Self-pay | Admitting: Internal Medicine

## 2011-11-05 ENCOUNTER — Other Ambulatory Visit: Payer: Self-pay | Admitting: Internal Medicine

## 2011-11-05 NOTE — Telephone Encounter (Signed)
Refill for Omeprazole DR 20 MG Capsule Qty 30 Take 1-capsule 30 minutes before breakfast Last [facility]13[facility]2012  *CHART shows we sent prescription 12.18.12 with 5-refills Patient has lab appt 4.15.13 & follow up wt/Hopper 4.18

## 2011-11-06 MED ORDER — OMEPRAZOLE 20 MG PO CPDR: DELAYED_RELEASE_CAPSULE | ORAL | Status: DC

## 2011-11-06 NOTE — Telephone Encounter (Signed)
Rx sent 

## 2011-11-17 ENCOUNTER — Other Ambulatory Visit (INDEPENDENT_AMBULATORY_CARE_PROVIDER_SITE_OTHER): Payer: BC Managed Care – PPO

## 2011-11-17 DIAGNOSIS — T887XXA Unspecified adverse effect of drug or medicament, initial encounter: Secondary | ICD-10-CM

## 2011-11-17 DIAGNOSIS — E785 Hyperlipidemia, unspecified: Secondary | ICD-10-CM

## 2011-11-17 LAB — LIPID PANEL
Total CHOL/HDL Ratio: 4
VLDL: 33.2 mg/dL (ref 0.0–40.0)

## 2011-11-17 LAB — HEPATIC FUNCTION PANEL
ALT: 23 U/L (ref 0–53)
Bilirubin, Direct: 0 mg/dL (ref 0.0–0.3)
Total Bilirubin: 0.2 mg/dL — ABNORMAL LOW (ref 0.3–1.2)

## 2011-11-17 LAB — BASIC METABOLIC PANEL
BUN: 20 mg/dL (ref 6–23)
Calcium: 7.7 mg/dL — ABNORMAL LOW (ref 8.4–10.5)
GFR: 64.04 mL/min (ref >60.00)
Glucose, Bld: 99 mg/dL (ref 70–99)
Potassium: 3.5 mEq/L (ref 3.5–5.1)

## 2011-11-17 LAB — CBC WITH DIFFERENTIAL/PLATELET
Basophils Relative: 0.9 % (ref 0.0–3.0)
Eosinophils Absolute: 0.1 10*3/uL (ref 0.0–0.7)
HCT: 40 % (ref 39.0–52.0)
Lymphs Abs: 1.4 10*3/uL (ref 0.7–4.0)
MCHC: 33.2 g/dL (ref 30.0–36.0)
MCV: 94.7 fl (ref 78.0–100.0)
Monocytes Absolute: 0.4 10*3/uL (ref 0.1–1.0)
Neutro Abs: 2.8 10*3/uL (ref 1.4–7.7)
Neutrophils Relative %: 59 % (ref 43.0–77.0)
RBC: 4.23 Mil/uL (ref 4.22–5.81)

## 2011-11-20 ENCOUNTER — Encounter: Payer: Self-pay | Admitting: Internal Medicine

## 2011-11-20 ENCOUNTER — Ambulatory Visit (INDEPENDENT_AMBULATORY_CARE_PROVIDER_SITE_OTHER): Payer: BC Managed Care – PPO | Admitting: Internal Medicine

## 2011-11-20 VITALS — BP 130/78 | HR 90 | Wt 176.0 lb

## 2011-11-20 DIAGNOSIS — J309 Allergic rhinitis, unspecified: Secondary | ICD-10-CM

## 2011-11-20 DIAGNOSIS — E785 Hyperlipidemia, unspecified: Secondary | ICD-10-CM

## 2011-11-20 MED ORDER — FLUTICASONE PROPIONATE 50 MCG/ACT NA SUSP: 1.0000 | Freq: Two times a day (BID) | NASAL | Status: DC | PRN

## 2011-11-20 NOTE — Patient Instructions (Addendum)
.  Share results with all  MDs seen . The most common cause of elevated triglycerides (TG) is the ingestion of sugar from high fructose corn syrup sources. You should consume less than 40 grams  (preferably ZERO)  of sugar per day from foods and drinks with high fructose corn syrup as number 1, 2, 3, or #4 on the label. As TG go up, HDL or good cholesterol goes down. Also uric acid which causes gout will go up.

## 2011-11-20 NOTE — Progress Notes (Signed)
Subjective:    Patient ID: Luis Villegas, male    DOB: [birth year], 66 y.o.   MRN: [id number]  HPI  Family history and advanced cholesterol testing demonstrate significant increased risk of coronary artery event. Specifically his brother had a heart attack at 22 and his mother at 42. His father had a stroke at 93. With an LDL of 75 he still had elevated particle number of 1651 indicating over 16% increased risk of cardiovascular event. Based on the advanced cholesterol testing his minimal LDL goal is as far below 70 as possible.  Vytorin 10/40 LDLs have been 75 and 80 serially.  Options on his plan include generic Lipitor, lovastatin, pravastatin, and simvastatin. He has taken Lipitor in the past but this caused myalgias.  Triglycerides 166; dietary interventions will be recommended. The remainder of his extensive labs were normal. His TSH was 0.4 which is in his range of normal    Review of Systems   His calcium is low at 7.7; he states this is due to the trial drug he is taking through [facility].  He denies chest pain, palpitations, dyspnea, or claudication.  He does have occasional hip pain but denies myalgias.  He's had no change in color of his stool or urine.  With pollen exposure he has had significant nasal congestion despite using a Neti Pot and requests medication     Objective:   Physical Exam Gen.: Healthy and well-nourished in appearance. Alert, appropriate and cooperative throughout exam.  Eyes: No corneal or conjunctival inflammation noted.  Nose: External nasal exam reveals no deformity or inflammation. Nasal mucosa are pink and moist. No lesions or exudates noted.  Mouth: Oral mucosa and oropharynx reveal no lesions or exudates. Teeth in good repair. Neck: No deformities, masses, or tenderness noted.  Thyroid normal Lungs: Normal respiratory effort; chest expands symmetrically. Lungs are clear to auscultation without rales, wheezes, or increased work of  breathing. Heart: Normal rate and rhythm. Normal S1 and S2. No gallop, click, or rub. S 4 w/o  murmur. Abdomen: Bowel sounds normal; abdomen soft and nontender. No masses, organomegaly or hernias noted.                            Musculoskeletal/extremities:  No clubbing, cyanosis, edema, or deformity noted. Range of motion  normal .Tone & strength  normal.Joints normal. Nail health  good. Vascular: Carotid, radial artery, dorsalis pedis and  posterior tibial pulses are full and equal. No bruits present. Neurologic: Alert and oriented x3. Deep tendon reflexes symmetrical and normal.          Skin: Intact without suspicious lesions or rashes. Lymph: No cervical, axillary lymphadenopathy present. Psych: Mood and affect are normal. Normally interactive                                                                                         Assessment & Plan:  #1 strong family history of premature coronary artery disease/myocardial infarction  #2 advanced cholesterol testing indicating LDL goal is at least 70 or below  #3 history of myalgias while on Lipitor  #4  good response to the combined simvastatin and ezetimibe (Vytorin). The simvastatin dose would have to be increased to 80 mg which could be associated with adverse drug to drug interactions. Lovastatin pravastatin do not offer this strength required to get LDL to goal.  #5 hypocalcemia due to Sensipar  Plan: A copy of this will be sent to his insurance company requesting coverage for Vytorin 10/40 or simvastatin 40+ ezetimibe 10. If declined; I would request to speak to an preauthorizing  physician who is familiar with advanced cholesterol testing.  Final option would be simvastatin 40 with repeat fasting lipids after 8-10 weeks. If LDL is not at goal then Zetia 10 mg to be added to simvastatin 40 mgD

## 2011-12-08 ENCOUNTER — Other Ambulatory Visit: Payer: Self-pay | Admitting: *Deleted

## 2011-12-08 MED ORDER — BUPROPION HCL ER (XL) 300 MG PO TB24: ORAL_TABLET | ORAL | Status: DC

## 2011-12-08 MED ORDER — CITALOPRAM HYDROBROMIDE 20 MG PO TABS: ORAL_TABLET | ORAL | Status: DC

## 2011-12-22 ENCOUNTER — Other Ambulatory Visit: Payer: Self-pay | Admitting: Internal Medicine

## 2011-12-22 MED ORDER — ACYCLOVIR 200 MG PO CAPS: ORAL_CAPSULE | ORAL | Status: DC

## 2011-12-22 MED ORDER — BUPROPION HCL ER (XL) 300 MG PO TB24: ORAL_TABLET | ORAL | Status: DC

## 2011-12-22 NOTE — Telephone Encounter (Signed)
RX sent

## 2011-12-22 NOTE — Telephone Encounter (Signed)
Refills x 2  Last ov 4.18.13  1-cyclovir 200mg  capsule qty 30, last fill 2.16.13 Take one capsule by mouth every day   2-bupropion hcl xl 300mg  tablet, qty 30, last fill 1.2213 Take one tablet by mouth every day

## 2012-01-25 ENCOUNTER — Other Ambulatory Visit: Payer: Self-pay | Admitting: Internal Medicine

## 2012-01-26 NOTE — Telephone Encounter (Signed)
Dr.Hopper please advise based on provided information:  Co-administration of citalopram and SUMAtriptan may cause central nervous system toxicity, and rarely, serotonin syndrome. The FDA recommends that patients should be informed of the possibility of serotonin syndrome.

## 2012-01-26 NOTE — Telephone Encounter (Signed)
OK # 90 , BUT The serotonin reuptake inhibitor (citalopram) should be held if the tryptan  is taken for migraines . The tryptan  could increase the relative dose of the serotonin reuptake inhibitor as they would both be metabolized through the liver.

## 2012-01-27 NOTE — Telephone Encounter (Signed)
If patient takes Citalopram at his scheduled time ie 5 pm and develops a migraine at 7 pm what should patient do in ref to the Migraine?

## 2012-01-27 NOTE — Telephone Encounter (Signed)
Do not take these medicines within @ least 6 hours of each other

## 2012-01-28 NOTE — Telephone Encounter (Signed)
I called patient on work phone, number invalid x 3-5 years (patient not working at this number). Home phone not a working number. I will mail patient a letter

## 2012-02-09 ENCOUNTER — Telehealth: Payer: Self-pay | Admitting: *Deleted

## 2012-02-09 MED ORDER — EZETIMIBE-SIMVASTATIN 10-40 MG PO TABS: 1.0000 | ORAL_TABLET | Freq: Every day | ORAL | Status: DC

## 2012-02-09 NOTE — Telephone Encounter (Signed)
Pt reports that he is almost out of Vytorin 10-40 samples given in April 2013 [Rx denied by [employer]]; states that he will begin Medicare on 08.01.13. [facility] [phone]; done as requested/SLS

## 2012-06-17 ENCOUNTER — Other Ambulatory Visit: Payer: Self-pay | Admitting: Internal Medicine

## 2012-06-17 NOTE — Telephone Encounter (Signed)
Rx sent.    MW 

## 2012-11-16 ENCOUNTER — Other Ambulatory Visit: Payer: Self-pay | Admitting: Internal Medicine

## 2012-11-16 NOTE — Telephone Encounter (Signed)
Medication management due

## 2012-12-27 ENCOUNTER — Other Ambulatory Visit: Payer: Self-pay | Admitting: Internal Medicine

## 2012-12-29 NOTE — Telephone Encounter (Signed)
Left message on voicemail informing patient that he is due for a CPX with Hopp, patient instructed to call and schedule

## 2013-02-19 ENCOUNTER — Other Ambulatory Visit: Payer: Self-pay | Admitting: Internal Medicine

## 2013-02-21 NOTE — Telephone Encounter (Signed)
Patient needs to schedule medication management appointment

## 2014-08-04 DIAGNOSIS — K635 Polyp of colon: Secondary | ICD-10-CM

## 2014-08-04 HISTORY — PX: POLYPECTOMY: SHX149

## 2014-08-04 HISTORY — DX: Polyp of colon: K63.5

## 2015-11-07 DIAGNOSIS — N200 Calculus of kidney: Secondary | ICD-10-CM | POA: Diagnosis not present

## 2015-11-07 DIAGNOSIS — R310 Gross hematuria: Secondary | ICD-10-CM | POA: Diagnosis not present

## 2015-11-07 DIAGNOSIS — N320 Bladder-neck obstruction: Secondary | ICD-10-CM | POA: Diagnosis not present

## 2015-11-07 DIAGNOSIS — C61 Malignant neoplasm of prostate: Secondary | ICD-10-CM | POA: Diagnosis not present

## 2015-11-07 DIAGNOSIS — N3040 Irradiation cystitis without hematuria: Secondary | ICD-10-CM | POA: Diagnosis not present

## 2015-11-08 DIAGNOSIS — G4733 Obstructive sleep apnea (adult) (pediatric): Secondary | ICD-10-CM | POA: Diagnosis not present

## 2015-11-29 ENCOUNTER — Encounter: Payer: Self-pay | Admitting: Family Medicine

## 2015-11-29 DIAGNOSIS — R739 Hyperglycemia, unspecified: Secondary | ICD-10-CM | POA: Diagnosis not present

## 2015-11-29 DIAGNOSIS — E785 Hyperlipidemia, unspecified: Secondary | ICD-10-CM | POA: Diagnosis not present

## 2015-11-29 DIAGNOSIS — K219 Gastro-esophageal reflux disease without esophagitis: Secondary | ICD-10-CM | POA: Diagnosis not present

## 2015-12-04 DIAGNOSIS — F4321 Adjustment disorder with depressed mood: Secondary | ICD-10-CM | POA: Diagnosis not present

## 2015-12-04 DIAGNOSIS — E7800 Pure hypercholesterolemia, unspecified: Secondary | ICD-10-CM | POA: Diagnosis not present

## 2015-12-04 DIAGNOSIS — I10 Essential (primary) hypertension: Secondary | ICD-10-CM | POA: Diagnosis not present

## 2016-01-15 DIAGNOSIS — D485 Neoplasm of uncertain behavior of skin: Secondary | ICD-10-CM | POA: Diagnosis not present

## 2016-01-15 DIAGNOSIS — L298 Other pruritus: Secondary | ICD-10-CM | POA: Diagnosis not present

## 2016-01-15 DIAGNOSIS — L905 Scar conditions and fibrosis of skin: Secondary | ICD-10-CM | POA: Diagnosis not present

## 2016-01-15 DIAGNOSIS — L538 Other specified erythematous conditions: Secondary | ICD-10-CM | POA: Diagnosis not present

## 2016-01-15 DIAGNOSIS — L820 Inflamed seborrheic keratosis: Secondary | ICD-10-CM | POA: Diagnosis not present

## 2016-05-01 DIAGNOSIS — E7800 Pure hypercholesterolemia, unspecified: Secondary | ICD-10-CM | POA: Diagnosis not present

## 2016-05-01 DIAGNOSIS — Z23 Encounter for immunization: Secondary | ICD-10-CM | POA: Diagnosis not present

## 2016-05-01 DIAGNOSIS — M25511 Pain in right shoulder: Secondary | ICD-10-CM | POA: Diagnosis not present

## 2016-05-28 DIAGNOSIS — Z23 Encounter for immunization: Secondary | ICD-10-CM | POA: Diagnosis not present

## 2016-06-03 DIAGNOSIS — C61 Malignant neoplasm of prostate: Secondary | ICD-10-CM | POA: Diagnosis not present

## 2016-06-03 DIAGNOSIS — D649 Anemia, unspecified: Secondary | ICD-10-CM | POA: Diagnosis not present

## 2016-06-03 DIAGNOSIS — E785 Hyperlipidemia, unspecified: Secondary | ICD-10-CM | POA: Diagnosis not present

## 2016-06-03 DIAGNOSIS — K219 Gastro-esophageal reflux disease without esophagitis: Secondary | ICD-10-CM | POA: Diagnosis not present

## 2016-06-04 ENCOUNTER — Encounter: Payer: Self-pay | Admitting: Family Medicine

## 2016-06-06 ENCOUNTER — Encounter: Payer: Self-pay | Admitting: Family Medicine

## 2016-06-06 DIAGNOSIS — E7800 Pure hypercholesterolemia, unspecified: Secondary | ICD-10-CM | POA: Diagnosis not present

## 2016-06-06 DIAGNOSIS — Z0000 Encounter for general adult medical examination without abnormal findings: Secondary | ICD-10-CM | POA: Diagnosis not present

## 2016-07-03 DIAGNOSIS — Z961 Presence of intraocular lens: Secondary | ICD-10-CM | POA: Diagnosis not present

## 2016-07-03 DIAGNOSIS — H3562 Retinal hemorrhage, left eye: Secondary | ICD-10-CM | POA: Diagnosis not present

## 2016-07-08 DIAGNOSIS — H35441 Age-related reticular degeneration of retina, right eye: Secondary | ICD-10-CM | POA: Diagnosis not present

## 2016-07-08 DIAGNOSIS — H35072 Retinal telangiectasis, left eye: Secondary | ICD-10-CM | POA: Diagnosis not present

## 2016-07-08 DIAGNOSIS — H35442 Age-related reticular degeneration of retina, left eye: Secondary | ICD-10-CM | POA: Diagnosis not present

## 2016-09-17 DIAGNOSIS — H35072 Retinal telangiectasis, left eye: Secondary | ICD-10-CM | POA: Diagnosis not present

## 2016-10-07 DIAGNOSIS — C61 Malignant neoplasm of prostate: Secondary | ICD-10-CM | POA: Diagnosis not present

## 2016-10-07 DIAGNOSIS — N320 Bladder-neck obstruction: Secondary | ICD-10-CM | POA: Diagnosis not present

## 2016-10-07 DIAGNOSIS — N200 Calculus of kidney: Secondary | ICD-10-CM | POA: Diagnosis not present

## 2016-12-02 DIAGNOSIS — C61 Malignant neoplasm of prostate: Secondary | ICD-10-CM | POA: Diagnosis not present

## 2016-12-02 DIAGNOSIS — E785 Hyperlipidemia, unspecified: Secondary | ICD-10-CM | POA: Diagnosis not present

## 2016-12-02 DIAGNOSIS — G43909 Migraine, unspecified, not intractable, without status migrainosus: Secondary | ICD-10-CM | POA: Diagnosis not present

## 2016-12-03 LAB — HEPATIC FUNCTION PANEL
ALT: 23 (ref 10–40)
AST: 24 (ref 14–40)
Alkaline Phosphatase: 48 (ref 25–125)

## 2016-12-03 LAB — BASIC METABOLIC PANEL
BUN: 20 (ref 4–21)
CREATININE: 1.4 — AB (ref <=1.3)
Glucose: 97
Potassium: 4.7 (ref 3.4–5.3)
SODIUM: 143 (ref 137–147)

## 2016-12-03 LAB — CBC AND DIFFERENTIAL
HEMATOCRIT: 44 (ref 41–53)
Hemoglobin: 13.5 (ref 13.5–17.5)
Platelets: 237 (ref 150–399)
WBC: 5.2

## 2016-12-03 LAB — LIPID PANEL
Cholesterol: 136 (ref 0–200)
HDL: 45 (ref 35–70)
LDL Cholesterol: 70
TRIGLYCERIDES: 177 — AB (ref 40–160)

## 2016-12-03 LAB — TSH: TSH: 0.56 (ref <=5.90)

## 2016-12-03 LAB — PSA: PSA: 2.19

## 2016-12-05 DIAGNOSIS — E7800 Pure hypercholesterolemia, unspecified: Secondary | ICD-10-CM | POA: Diagnosis not present

## 2016-12-05 DIAGNOSIS — M169 Osteoarthritis of hip, unspecified: Secondary | ICD-10-CM | POA: Diagnosis not present

## 2016-12-05 DIAGNOSIS — K219 Gastro-esophageal reflux disease without esophagitis: Secondary | ICD-10-CM | POA: Diagnosis not present

## 2016-12-16 DIAGNOSIS — E785 Hyperlipidemia, unspecified: Secondary | ICD-10-CM | POA: Diagnosis not present

## 2016-12-16 DIAGNOSIS — G459 Transient cerebral ischemic attack, unspecified: Secondary | ICD-10-CM | POA: Diagnosis not present

## 2016-12-16 DIAGNOSIS — I6523 Occlusion and stenosis of bilateral carotid arteries: Secondary | ICD-10-CM | POA: Diagnosis not present

## 2016-12-24 DIAGNOSIS — H35072 Retinal telangiectasis, left eye: Secondary | ICD-10-CM | POA: Diagnosis not present

## 2017-01-19 DIAGNOSIS — L814 Other melanin hyperpigmentation: Secondary | ICD-10-CM | POA: Diagnosis not present

## 2017-01-19 DIAGNOSIS — Z85828 Personal history of other malignant neoplasm of skin: Secondary | ICD-10-CM | POA: Diagnosis not present

## 2017-01-19 DIAGNOSIS — D225 Melanocytic nevi of trunk: Secondary | ICD-10-CM | POA: Diagnosis not present

## 2017-01-19 DIAGNOSIS — L570 Actinic keratosis: Secondary | ICD-10-CM | POA: Diagnosis not present

## 2017-01-19 DIAGNOSIS — Z08 Encounter for follow-up examination after completed treatment for malignant neoplasm: Secondary | ICD-10-CM | POA: Diagnosis not present

## 2017-01-19 DIAGNOSIS — L821 Other seborrheic keratosis: Secondary | ICD-10-CM | POA: Diagnosis not present

## 2017-02-26 DIAGNOSIS — R040 Epistaxis: Secondary | ICD-10-CM | POA: Diagnosis not present

## 2017-02-26 DIAGNOSIS — J310 Chronic rhinitis: Secondary | ICD-10-CM | POA: Diagnosis not present

## 2017-05-06 DIAGNOSIS — Z23 Encounter for immunization: Secondary | ICD-10-CM | POA: Diagnosis not present

## 2017-05-28 DIAGNOSIS — Z23 Encounter for immunization: Secondary | ICD-10-CM | POA: Diagnosis not present

## 2017-07-08 ENCOUNTER — Encounter: Payer: Self-pay | Admitting: Family Medicine

## 2017-07-08 ENCOUNTER — Ambulatory Visit (INDEPENDENT_AMBULATORY_CARE_PROVIDER_SITE_OTHER): Payer: Medicare Other | Admitting: Family Medicine

## 2017-07-08 VITALS — BP 130/78 | HR 75 | Temp 98.3°F | Ht 67.5 in | Wt 173.6 lb

## 2017-07-08 DIAGNOSIS — E782 Mixed hyperlipidemia: Secondary | ICD-10-CM

## 2017-07-08 DIAGNOSIS — F419 Anxiety disorder, unspecified: Secondary | ICD-10-CM | POA: Diagnosis not present

## 2017-07-08 DIAGNOSIS — B009 Herpesviral infection, unspecified: Secondary | ICD-10-CM | POA: Diagnosis not present

## 2017-07-08 DIAGNOSIS — Z8546 Personal history of malignant neoplasm of prostate: Secondary | ICD-10-CM | POA: Diagnosis not present

## 2017-07-08 DIAGNOSIS — G43909 Migraine, unspecified, not intractable, without status migrainosus: Secondary | ICD-10-CM

## 2017-07-08 DIAGNOSIS — K219 Gastro-esophageal reflux disease without esophagitis: Secondary | ICD-10-CM

## 2017-07-08 MED ORDER — ACYCLOVIR 200 MG PO CAPS: ORAL_CAPSULE | ORAL | 3 refills | Status: DC

## 2017-07-08 NOTE — Patient Instructions (Addendum)
Food Choices for Gastroesophageal Reflux Disease, Adult When you have gastroesophageal reflux disease (GERD), the foods you eat and your eating habits are very important. Choosing the right foods can help ease your discomfort. What guidelines do I need to follow?  Choose fruits, vegetables, whole grains, and low-fat dairy products.  Choose low-fat meat, fish, and poultry.  Limit fats such as oils, salad dressings, butter, nuts, and avocado.  Keep a food diary. This helps you identify foods that cause symptoms.  Avoid foods that cause symptoms. These may be different for everyone.  Eat small meals often instead of 3 large meals a day.  Eat your meals slowly, in a place where you are relaxed.  Limit fried foods.  Cook foods using methods other than frying.  Avoid drinking alcohol.  Avoid drinking large amounts of liquids with your meals.  Avoid bending over or lying down until 2-3 hours after eating. What foods are not recommended? These are some foods and drinks that may make your symptoms worse: Vegetables Tomatoes. Tomato juice. Tomato and spaghetti sauce. Chili peppers. Onion and garlic. Horseradish. Fruits Oranges, grapefruit, and lemon (fruit and juice). Meats High-fat meats, fish, and poultry. This includes hot dogs, ribs, ham, sausage, salami, and bacon. Dairy Whole milk and chocolate milk. Sour cream. Cream. Butter. Ice cream. Cream cheese. Drinks Coffee and tea. Bubbly (carbonated) drinks or energy drinks. Condiments Hot sauce. Barbecue sauce. Sweets/Desserts Chocolate and cocoa. Donuts. Peppermint and spearmint. Fats and Oils High-fat foods. This includes [place] fries and potato chips. Other Vinegar. Strong spices. This includes black pepper, white pepper, red pepper, cayenne, curry powder, cloves, ginger, and chili powder. The items listed above may not be a complete list of foods and drinks to avoid. Contact your dietitian for more information. This  information is not intended to replace advice given to you by your health care provider. Make sure you discuss any questions you have with your health care provider. Document Released: 01/20/2012 Document Revised: 12/27/2015 Document Reviewed: 05/25/2013 Elsevier Interactive Patient Education  2017 [place].  Migraine Headache A migraine headache is a very strong throbbing pain on one side or both sides of your head. Migraines can also cause other symptoms. Talk with your doctor about what things may bring on (trigger) your migraine headaches. Follow these instructions at home: Medicines  Take over-the-counter and prescription medicines only as told by your doctor.  Do not drive or use heavy machinery while taking prescription pain medicine.  To prevent or treat constipation while you are taking prescription pain medicine, your doctor may recommend that you: ? Drink enough fluid to keep your pee (urine) clear or pale yellow. ? Take over-the-counter or prescription medicines. ? Eat foods that are high in fiber. These include fresh fruits and vegetables, whole grains, and beans. ? Limit foods that are high in fat and processed sugars. These include fried and sweet foods. Lifestyle  Avoid alcohol.  Do not use any products that contain nicotine or tobacco, such as cigarettes and e-cigarettes. If you need help quitting, ask your doctor.  Get at least 8 hours of sleep every night.  Limit your stress. General instructions   Keep a journal to find out what may bring on your migraines. For example, write down: ? What you eat and drink. ? How much sleep you get. ? Any change in what you eat or drink. ? Any change in your medicines.  If you have a migraine: ? Avoid things that make your symptoms worse, such as  bright lights. ? It may help to lie down in a dark, quiet room. ? Do not drive or use heavy machinery. ? Ask your doctor what activities are safe for you.  Keep all  follow-up visits as told by your doctor. This is important. Contact a doctor if:  You get a migraine that is different or worse than your usual migraines. Get help right away if:  Your migraine gets very bad.  You have a fever.  You have a stiff neck.  You have trouble seeing.  Your muscles feel weak or like you cannot control them.  You start to lose your balance a lot.  You start to have trouble walking.  You pass out (faint). This information is not intended to replace advice given to you by your health care provider. Make sure you discuss any questions you have with your health care provider. Document Released: 04/29/2008 Document Revised: 02/08/2016 Document Reviewed: 01/07/2016 Elsevier Interactive Patient Education  2017 [employer].

## 2017-07-08 NOTE — Progress Notes (Signed)
Patient presents to clinic today to f/u on chronic issues.  SUBJECTIVE: PMH:Pt is a 71 yo with pmh sig for HLD, h/o prostate ca s/p radiation, GERD, anxiety, and migraines.  [place], [place]  Pt also had a Urologist in that area.  Pt asked about previous h/o TIA as mentioned in chart as reason for Carotid doppler.  Pt unaware of this dx.  States he never had a TIA nor was ever told he had one.  Thinks had a CPE in March 2018.  H/o prostate Ca: -dx'd by elevated PSA -was followed by Urology -underwent radiation (35 treatments) -Was having PSA checked every 6 months by urology -having some radiation prostatitis -family h/o prostate ca (dad, and 2 brothers)  GERD: -taking prilosec -symptoms improved since being on med.  Migraine HAs: -has imitrex 100 mg tabs.  Will take 1/2 at start of HA -Also taking Topamax 200 mg at bedtime. -may have a few HAs per wk. -endorses needing to drink more water.  Anxiety/depression?: -taking wellbutrin 300 mg daily -endorses good mood and sleep.  So so concentration, and decreased energy. -pt used to walk a few miles 3x[place]wk [place] the beach, but hasn't been doing much physical activity since moving back to the area.  HSV: -taking Acyclovir 200 mg daily -has been taking over 40 yrs -no current issues  HLD: -taking ezetimibe-simvastatin 10-40 mg -denies myalgias or GI issues   Allergies:  lipitor  P Surg Hx: -neck and shoulder in 2003 -Prostate surgery and radiation 2003 -lithotripsy -tonsilectomy  Social hx: Pt is married.  He is retired from [employer].  Pt is a former smoker.  Pt denies EtOH or drug use.   Family medical history: Mom-deceased, breast cancer, DM, MI Dad-deceased, likely prostate cancer that spread Brother-Jim, alive, alcohol abuse, prostate cancer, COPD, MI Brother-Dean, alive, prostate cancer  Health Maintenance: Dental --Dr. Arita Miss Immunizations --tetanus 2012, Pneumovax 2018, flu shot 2018, shingles (had the 1 dose vaccine) Colonoscopy --2014 or 2015  Past Medical History:  Diagnosis Date  . Cancer [facility])    prostate cancer  . Colon polyps   . GERD (gastroesophageal reflux disease)   . Heart murmur   . Hyperlipidemia     Past Surgical History:  Procedure Laterality Date  . CARPAL TUNNEL RELEASE    . CERVICAL FUSION    . COLONOSCOPY  2008  . COLONOSCOPY W/ POLYPECTOMY  2002  . PROSTATECTOMY  2003   Dr.Davis  . TONSILLECTOMY      Current Outpatient Medications on File Prior to Visit  Medication Sig Dispense Refill  . acyclovir (ZOVIRAX) 200 MG capsule TAKE ONE CAPSULE BY MOUTH EVERY DAY 90 capsule 1  . buPROPion (WELLBUTRIN XL) 300 MG 24 hr tablet TAKE 1 TABLET DAILY 90 tablet 2  . citalopram (CELEXA) 20 MG tablet TAKE ONE TABLET BY MOUTH ONE TIME DAILY 90 tablet 0  . ezetimibe-simvastatin (VYTORIN) 10-40 MG per tablet Take 1 tablet by mouth at bedtime. 30 tablet 6  . omeprazole (PRILOSEC) 20 MG capsule TAKE 1 CAPSULE 30 MINUTES BEFORE BREAKFAST 30 capsule 5  . SUMAtriptan (IMITREX) 100 MG tablet Take 100 mg by mouth as needed.      . topiramate (TOPAMAX) 200 MG tablet Take 200 mg by mouth at bedtime.    . fluticasone (FLONASE) 50 MCG/ACT nasal spray Place 1 spray into the nose 2 (two) times daily as needed for rhinitis. 16 g  2   No current facility-administered medications on file prior to visit.     Allergies  Allergen Reactions  . Lipitor [Atorvastatin Calcium]     myalgias    Family History  Problem Relation Age of Onset  . Cancer Father        Bladder Cancer  . Stroke Father 61  . Breast cancer Mother   . Diabetes Mother   . Neuropathy Mother   . Valvular heart disease Mother   . Heart attack Mother 8  . Colon polyps Brother   . Prostate cancer Brother   . Valvular heart disease Brother   . Heart attack Brother 33    Social History   Socioeconomic History  . Marital  status: Married    Spouse name: Not on file  . Number of children: Not on file  . Years of education: Not on file  . Highest education level: Not on file  Social Needs  . Financial resource strain: Not on file  . Food insecurity - worry: Not on file  . Food insecurity - inability: Not on file  . Transportation needs - medical: Not on file  . Transportation needs - non-medical: Not on file  Occupational History  . Not on file  Tobacco Use  . Smoking status: Former Smoker    Last attempt to quit: 08/04/1993    Years since quitting: 23.9  . Smokeless tobacco: Never Used  Substance and Sexual Activity  . Alcohol use: No  . Drug use: No  . Sexual activity: Not on file  Other Topics Concern  . Not on file  Social History Narrative  . Not on file    ROS General: Denies fever, chills, night sweats, changes in weight, changes in appetite HEENT: Denies ear pain, changes in vision, rhinorrhea, sore throat  +HA CV: Denies CP, palpitations, SOB, orthopnea Pulm: Denies SOB, cough, wheezing GI: Denies abdominal pain, nausea, vomiting, diarrhea, constipation GU: Denies dysuria, hematuria, frequency, vaginal discharge  + radiation prostatitis Msk: Denies muscle cramps, joint pains Neuro: Denies weakness, numbness, tingling Skin: Denies rashes, bruising Psych: Denies hallucinations  +anxiety/depression  BP 130/78 (BP Location: Right Arm, Patient Position: Sitting, Cuff Size: Normal)   Pulse 75   Temp 98.3 F (36.8 C) (Oral)   Ht 5' 7.5" (1.715 m)   Wt 173 lb 9.6 oz (78.7 kg)   BMI 26.79 kg/m   Physical Exam Gen. Pleasant, well developed, well-nourished, in NAD HEENT - [place]/AT, PERRL, no scleral icterus, no nasal drainage, pharynx without erythema or exudate. Neck: No JVD, no thyromegaly, no carotid bruits Lungs: no use of accessory muscles, CTAB, no wheezes, rales or rhonchi Cardiovascular: RRR, No r/g/m, no peripheral edema Abdomen: BS present, soft, nontender,nondistended, no  hepatosplenomegaly Musculoskeletal: No deformities, moves all four extremities, no cyanosis or clubbing, normal tone Neuro:  A&Ox3, CN II-XII intact, normal gait Skin:  Warm, dry, intact, no lesions Psych: normal affect, mood appropriate  No results found for this or any previous visit (from the past 2160 hour(s)).  Assessment/Plan: Mixed hyperlipidemia -Continue ezetimibe-simvastatin 10-40 mg -Continue lifestyle modifications -We will recheck cholesterol in March 2019  History of prostate cancer  - Plan: Ambulatory referral to Urology  Gastroesophageal reflux disease, esophagitis presence not specified -Continue omeprazole 20 mg daily -Given a list of foods to avoid that may trigger reflux symptoms  Anxiety -Controlled -Continue Wellbutrin 300 mg daily  Migraine without status migrainosus, not intractable, unspecified migraine type -Continue Imitrex 100 mg take one half tab as  needed -Continue Topamax 200 mg daily  HSV infection -Stable -Continue acyclovir 200 mg daily.  Rx sent to pharmacy  Establish care -We reviewed the PMH, PSH, FH, SH, Meds and Allergies. -We provided refills for any medications we will prescribe as needed. -We addressed current concerns per orders and patient instructions. -We have asked for records for pertinent exams, studies, vaccines and notes from previous providers. -We have advised patient to follow up per instructions below.   Follow-up PRN.  Can schedule next physical from March 2019.

## 2017-08-13 DIAGNOSIS — R3915 Urgency of urination: Secondary | ICD-10-CM | POA: Diagnosis not present

## 2017-08-13 DIAGNOSIS — C61 Malignant neoplasm of prostate: Secondary | ICD-10-CM | POA: Diagnosis not present

## 2017-08-13 DIAGNOSIS — N200 Calculus of kidney: Secondary | ICD-10-CM | POA: Diagnosis not present

## 2017-08-13 DIAGNOSIS — R9721 Rising PSA following treatment for malignant neoplasm of prostate: Secondary | ICD-10-CM | POA: Diagnosis not present

## 2017-09-01 ENCOUNTER — Other Ambulatory Visit: Payer: Self-pay | Admitting: Family Medicine

## 2017-09-01 NOTE — Telephone Encounter (Signed)
Patient would like a refill on medication. Saw in your note that patient is taking this medication and you would like for him to start on a 1/2 of tablet. Would you like the 100 mg sent in or 50 mg sent to patient pharmacy? Please advise. Thank you.

## 2017-09-10 DIAGNOSIS — R350 Frequency of micturition: Secondary | ICD-10-CM | POA: Diagnosis not present

## 2017-09-10 DIAGNOSIS — C61 Malignant neoplasm of prostate: Secondary | ICD-10-CM | POA: Diagnosis not present

## 2017-09-10 DIAGNOSIS — R3915 Urgency of urination: Secondary | ICD-10-CM | POA: Diagnosis not present

## 2017-09-18 ENCOUNTER — Encounter: Payer: Self-pay | Admitting: Family Medicine

## 2017-10-08 DIAGNOSIS — C61 Malignant neoplasm of prostate: Secondary | ICD-10-CM | POA: Diagnosis not present

## 2017-10-08 DIAGNOSIS — R350 Frequency of micturition: Secondary | ICD-10-CM | POA: Diagnosis not present

## 2017-10-08 DIAGNOSIS — R351 Nocturia: Secondary | ICD-10-CM | POA: Diagnosis not present

## 2017-10-28 ENCOUNTER — Ambulatory Visit (INDEPENDENT_AMBULATORY_CARE_PROVIDER_SITE_OTHER): Payer: Medicare Other

## 2017-10-28 ENCOUNTER — Ambulatory Visit (INDEPENDENT_AMBULATORY_CARE_PROVIDER_SITE_OTHER): Payer: Medicare Other | Admitting: Orthopedic Surgery

## 2017-10-28 ENCOUNTER — Encounter (INDEPENDENT_AMBULATORY_CARE_PROVIDER_SITE_OTHER): Payer: Self-pay | Admitting: Orthopedic Surgery

## 2017-10-28 DIAGNOSIS — M654 Radial styloid tenosynovitis [de Quervain]: Secondary | ICD-10-CM

## 2017-10-28 DIAGNOSIS — M25532 Pain in left wrist: Secondary | ICD-10-CM

## 2017-10-28 MED ORDER — METHYLPREDNISOLONE ACETATE 40 MG/ML IJ SUSP
13.3300 mg | INTRAMUSCULAR | Status: AC | PRN
  Administered 2017-10-28: 13.33 mg

## 2017-10-28 MED ORDER — LIDOCAINE HCL 1 % IJ SOLN
3.0000 mL | INTRAMUSCULAR | Status: AC | PRN
  Administered 2017-10-28: 3 mL

## 2017-10-28 MED ORDER — BUPIVACAINE HCL 0.25 % IJ SOLN
0.3300 mL | INTRAMUSCULAR | Status: AC | PRN
  Administered 2017-10-28: .33 mL

## 2017-10-28 NOTE — Progress Notes (Signed)
Office Visit Note   Patient: Luis Villegas           Date of Birth: 1946/07/08           MRN: [id number] Visit Date: 10/28/2017 Requested by: Luis Ruddy, MD [place], [place] [id number] PCP: Luis Ruddy, MD  Subjective: Chief Complaint  Patient presents with  . Left Wrist - Pain    HPI: Luis Villegas is a patient with left wrist pain.  Been going on for a month.  Denies any history of injury.  He localizes it to the radial styloid region.  States that the pain is worse in the morning and late evening.  He is left-hand dominant.  Takes Tylenol for pain.  He has prostate cancer.              ROS: All systems reviewed are negative as they relate to the chief complaint within the history of present illness.  Patient denies  fevers or chills.   Assessment & Plan: Visit Diagnoses:  1. Pain in left wrist     Plan: Impression is left wrist pain with de Quervain's tenosynovitis.  Plan is ultrasound-guided injection today with wrist splint.  Come back in 6 weeks if he is no better.  Discussed another injection versus surgical release.  I think a period of rest would also be helpful.  I will see him back as needed  Follow-Up Instructions: Return if symptoms worsen or fail to improve.   Orders:  Orders Placed This Encounter  Procedures  . XR Wrist Complete Left   No orders of the defined types were placed in this encounter.     Procedures: Hand/UE Inj: L extensor compartment 1 for de Quervain's tenosynovitis on 10/28/2017 12:08 PM Indications: therapeutic Details: 25 G needle, radial approach Medications: 3 mL lidocaine 1 %; 0.33 mL bupivacaine 0.25 %; 13.33 mg methylPREDNISolone acetate 40 MG/ML Outcome: tolerated well, no immediate complications Procedure, treatment alternatives, risks and benefits explained, specific risks discussed. Consent was given by the patient. Immediately prior to procedure a time out was called to verify the correct  patient, procedure, equipment, support staff and site/side marked as required. Patient was prepped and draped in the usual sterile fashion.       Clinical Data: No additional findings.  Objective: Vital Signs: There were no vitals taken for this visit.  Physical Exam:   Constitutional: Patient appears well-developed HEENT:  Head: Normocephalic Eyes:EOM are normal Neck: Normal range of motion Cardiovascular: Normal rate Pulmonary/chest: Effort normal Neurologic: Patient is alert Skin: Skin is warm Psychiatric: Patient has normal mood and affect    Ortho Exam: Orthopedic exam demonstrates good grip strength.  Palpable radial pulse.  Patient has positive Finkelstein's test on the left.  Otherwise good wrist range of motion.  Palpable radial pulse.  Tenderness to palpation over the radial styloid.  No other masses lymph adenopathy or skin changes noted in the left wrist region.  Specialty Comments:  No specialty comments available.  Imaging: Xr Wrist Complete Left  Result Date: 10/28/2017 AP lateral oblique and scaphoid view of left wrist reviewed.  No fracture present.  No significant degenerative changes present.  Normal left wrist    PMFS History: Patient Active Problem List   Diagnosis Date Noted  . VENTRAL HERNIA 11/23/2008  . COLONIC POLYPS, HX OF 11/23/2008  . HYPERLIPIDEMIA 11/11/2007  . CARPAL TUNNEL SYNDROME 11/11/2007  . GERD 11/11/2007  . SLEEP APNEA 11/11/2007  . PROSTATE CANCER, HX  OF 11/11/2007  . ANXIETY STATE NOS 05/18/2007  . MIGRAINE NEC W/INTRACTABLE MIGRAINE 03/23/2007  . ANEMIA-NOS 09/23/2006   Past Medical History:  Diagnosis Date  . Cancer [facility])    prostate cancer  . Colon polyps   . GERD (gastroesophageal reflux disease)   . Heart murmur   . Hyperlipidemia     Family History  Problem Relation Age of Onset  . Cancer Father        Bladder Cancer  . Stroke Father 41  . Breast cancer Mother   . Diabetes Mother   . Neuropathy Mother    . Valvular heart disease Mother   . Heart attack Mother 20  . Colon polyps Brother   . Prostate cancer Brother   . Valvular heart disease Brother   . Heart attack Brother 80    Past Surgical History:  Procedure Laterality Date  . CARPAL TUNNEL RELEASE    . CERVICAL FUSION    . COLONOSCOPY  2008  . COLONOSCOPY W/ POLYPECTOMY  2002  . PROSTATECTOMY  2003   Dr.Davis  . TONSILLECTOMY     Social History   Occupational History  . Not on file  Tobacco Use  . Smoking status: Former Smoker    Last attempt to quit: 08/04/1993    Years since quitting: 24.2  . Smokeless tobacco: Never Used  Substance and Sexual Activity  . Alcohol use: No  . Drug use: No  . Sexual activity: Not on file

## 2017-11-06 ENCOUNTER — Other Ambulatory Visit: Payer: Self-pay | Admitting: Family Medicine

## 2017-12-14 ENCOUNTER — Other Ambulatory Visit: Payer: Self-pay | Admitting: Family Medicine

## 2017-12-14 NOTE — Telephone Encounter (Signed)
Refill request received for 150 mg. Per chart and last notes, patient is taking 300 mg.  Called patient to clarify. He is currently taking 150 mg and this was last filled by his last PCP Dr. Coralee Pesa.  Okay to refill 150 mg as requested?

## 2017-12-15 NOTE — Telephone Encounter (Signed)
That's fine, but pt needs to schedule a f/u appt.

## 2017-12-15 NOTE — Telephone Encounter (Signed)
Pt notified of results/instructions and verbalized understanding. Appointment scheduled for 12/23/17.

## 2017-12-23 ENCOUNTER — Encounter: Payer: Self-pay | Admitting: Family Medicine

## 2017-12-23 ENCOUNTER — Ambulatory Visit (INDEPENDENT_AMBULATORY_CARE_PROVIDER_SITE_OTHER): Payer: Medicare Other | Admitting: Family Medicine

## 2017-12-23 VITALS — BP 110/70 | HR 70 | Temp 98.4°F | Ht 67.0 in | Wt 174.0 lb

## 2017-12-23 DIAGNOSIS — R5383 Other fatigue: Secondary | ICD-10-CM | POA: Diagnosis not present

## 2017-12-23 DIAGNOSIS — E781 Pure hyperglyceridemia: Secondary | ICD-10-CM | POA: Diagnosis not present

## 2017-12-23 DIAGNOSIS — F419 Anxiety disorder, unspecified: Secondary | ICD-10-CM | POA: Diagnosis not present

## 2017-12-23 DIAGNOSIS — Z131 Encounter for screening for diabetes mellitus: Secondary | ICD-10-CM

## 2017-12-23 DIAGNOSIS — J302 Other seasonal allergic rhinitis: Secondary | ICD-10-CM | POA: Diagnosis not present

## 2017-12-23 DIAGNOSIS — Z1321 Encounter for screening for nutritional disorder: Secondary | ICD-10-CM

## 2017-12-23 DIAGNOSIS — F329 Major depressive disorder, single episode, unspecified: Secondary | ICD-10-CM

## 2017-12-23 LAB — COMPREHENSIVE METABOLIC PANEL
ALBUMIN: 4.7 g/dL (ref 3.5–5.2)
ALK PHOS: 42 U/L (ref 39–117)
ALT: 21 U/L (ref 0–53)
AST: 17 U/L (ref 0–37)
BILIRUBIN TOTAL: 0.5 mg/dL (ref 0.2–1.2)
BUN: 21 mg/dL (ref 6–23)
CO2: 27 mEq/L (ref 19–32)
CREATININE: 1.49 mg/dL (ref 0.40–1.50)
Calcium: 10.1 mg/dL (ref 8.4–10.5)
Chloride: 107 mEq/L (ref 96–112)
GFR: 49.45 mL/min — ABNORMAL LOW (ref >60.00)
Glucose, Bld: 93 mg/dL (ref 70–99)
POTASSIUM: 4.2 meq/L (ref 3.5–5.1)
SODIUM: 141 meq/L (ref 135–145)
TOTAL PROTEIN: 7.5 g/dL (ref 6.0–8.3)

## 2017-12-23 LAB — LIPID PANEL
Cholesterol: 142 mg/dL (ref 0–200)
HDL: 40.8 mg/dL (ref >39.00)
LDL CALC: 72 mg/dL (ref 0–99)
NonHDL: 101.23
TRIGLYCERIDES: 148 mg/dL (ref 0.0–149.0)
Total CHOL/HDL Ratio: 3
VLDL: 29.6 mg/dL (ref 0.0–40.0)

## 2017-12-23 LAB — HEMOGLOBIN A1C: Hgb A1c MFr Bld: 6.1 % (ref 4.6–6.5)

## 2017-12-23 LAB — TSH: TSH: 0.44 u[IU]/mL (ref 0.35–4.50)

## 2017-12-23 LAB — T4, FREE: Free T4: 0.53 ng/dL — ABNORMAL LOW (ref 0.60–1.60)

## 2017-12-23 LAB — VITAMIN D 25 HYDROXY (VIT D DEFICIENCY, FRACTURES): VITD: 34.62 ng/mL (ref 30.00–100.00)

## 2017-12-23 NOTE — Progress Notes (Signed)
Subjective:    Patient ID: Luis Villegas, male    DOB: [birth year], 72 y.o.   MRN: [id number]  No chief complaint on file.   HPI Patient was seen today for follow-up on anxiety and depression.  Pt has been taking Wellbutrin XL 150 mg daily.  Endorses good mood and sleep.  Pt does note in general unwell feeling, though cannot elaborate.  States he feels more sore after doing activities such as yard work.  Pt does endorse seasonal allergies for which he was taking Flonase.  Pt has since stopped, but thinks he should restart.  Patient states he wanted to have his cholesterol rechecked today, but wishes to come back for CPE.  Past Medical History:  Diagnosis Date  . Cancer [facility])    prostate cancer  . Colon polyps   . GERD (gastroesophageal reflux disease)   . Heart murmur   . Hyperlipidemia     Allergies  Allergen Reactions  . Lipitor [Atorvastatin Calcium]     myalgias    ROS General: Denies fever, chills, night sweats, changes in weight, changes in appetite  +fatigue HEENT: Denies headaches, ear pain, changes in vision, rhinorrhea, sore throat  +rhinorrhea CV: Denies CP, palpitations, SOB, orthopnea Pulm: Denies SOB, cough, wheezing GI: Denies abdominal pain, nausea, vomiting, diarrhea, constipation GU: Denies dysuria, hematuria, frequency, vaginal discharge Msk: Denies muscle cramps, joint pains Neuro: Denies weakness, numbness, tingling Skin: Denies rashes, bruising Psych: Denies hallucinations anxiety and depression  + anxiety and depression     Objective:    Blood pressure 110/70, pulse 70, temperature 98.4 F (36.9 C), temperature source Oral, height 5\' 7"  (1.702 m), weight 174 lb (78.9 kg), SpO2 97 %.   Gen. Pleasant, well-nourished, in no distress, normal affect   HEENT: [place]/AT, face symmetric, no scleral icterus, PERRLA, nares patent without drainage, pharynx without erythema or exudate.  TMs full bilaterally. Lungs: no accessory muscle use, CTAB, no  wheezes or rales Cardiovascular: RRR, no m/r/g, no peripheral edema Abdomen: BS present, soft, NT/ND Neuro:  A&Ox3, CN II-XII intact, normal gait Skin:  Warm, no lesions/ rash   Wt Readings from Last 3 Encounters:  12/23/17 174 lb (78.9 kg)  07/08/17 173 lb 9.6 oz (78.7 kg)  11/20/11 176 lb (79.8 kg)    Lab Results  Component Value Date   WBC 5.2 12/03/2016   HGB 13.5 12/03/2016   HCT 44 12/03/2016   PLT 237 12/03/2016   GLUCOSE 93 12/23/2017   CHOL 142 12/23/2017   TRIG 148.0 12/23/2017   HDL 40.80 12/23/2017   LDLCALC 72 12/23/2017   ALT 21 12/23/2017   AST 17 12/23/2017   NA 141 12/23/2017   K 4.2 12/23/2017   CL 107 12/23/2017   CREATININE 1.49 12/23/2017   BUN 21 12/23/2017   CO2 27 12/23/2017   TSH 0.44 12/23/2017   PSA 2.19 12/03/2016   HGBA1C 6.1 12/23/2017    Assessment/Plan:  Hypertriglyceridemia  -Continue Vytorin 10-40 mg daily -Patient also encouraged to to modify eating habits - Plan: Lipid panel, Comprehensive metabolic panel  Anxiety and depression -PHQ 9 score 2 -Gad 7 score 0 -Continue Wellbutrin XL 150 mg daily - Plan: TSH, T4, free  Seasonal allergies -Patient encouraged to restart Flonase or consider taking allergy pill such as Claritin, Zyrtec, Allegra.  Screening for diabetes mellitus  - Plan: Hemoglobin A1c  Encounter for vitamin deficiency screening  - Plan: Vitamin D, 25-hydroxy  Fatigue, unspecified type  - Plan: Hemoglobin A1c, Vitamin D, 25-hydroxy,  Comprehensive metabolic panel, TSH, T4, free   Follow-up PRN in the next few months for CPE  Grier Mitts, MD

## 2017-12-23 NOTE — Patient Instructions (Signed)
Food Choices to Lower Your Triglycerides Triglycerides are a type of fat in your blood. High levels of triglycerides can increase the risk of heart disease and stroke. If your triglyceride levels are high, the foods you eat and your eating habits are very important. Choosing the right foods can help lower your triglycerides. What general guidelines do I need to follow?  Lose weight if you are overweight.  Limit or avoid alcohol.  Fill one half of your plate with vegetables and green salads.  Limit fruit to two servings a day. Choose fruit instead of juice.  Make one fourth of your plate whole grains. Look for the word "whole" as the first word in the ingredient list.  Fill one fourth of your plate with lean protein foods.  Enjoy fatty fish (such as salmon, mackerel, sardines, and tuna) three times a week.  Choose healthy fats.  Limit foods high in starch and sugar.  Eat more home-cooked food and less restaurant, buffet, and fast food.  Limit fried foods.  Cook foods using methods other than frying.  Limit saturated fats.  Check ingredient lists to avoid foods with partially hydrogenated oils (trans fats) in them. What foods can I eat? Grains Whole grains, such as whole wheat or whole grain breads, crackers, cereals, and pasta. Unsweetened oatmeal, bulgur, barley, quinoa, or brown rice. Corn or whole wheat flour tortillas. Vegetables Fresh or frozen vegetables (raw, steamed, roasted, or grilled). Green salads. Fruits All fresh, canned (in natural juice), or frozen fruits. Meat and Other Protein Products Ground beef (85% or leaner), grass-fed beef, or beef trimmed of fat. Skinless chicken or [place]. Ground chicken or [place]. Pork trimmed of fat. All fish and seafood. Eggs. Dried beans, peas, or lentils. Unsalted nuts or seeds. Unsalted canned or dry beans. Dairy Low-fat dairy products, such as skim or 1% milk, 2% or reduced-fat cheeses, low-fat ricotta or cottage cheese, or  plain low-fat yogurt. Fats and Oils Tub margarines without trans fats. Light or reduced-fat mayonnaise and salad dressings. Avocado. Safflower, olive, or canola oils. Natural peanut or almond butter. The items listed above may not be a complete list of recommended foods or beverages. Contact your dietitian for more options. What foods are not recommended? Grains White bread. White pasta. White rice. Cornbread. Bagels, pastries, and croissants. Crackers that contain trans fat. Vegetables White potatoes. Corn. Creamed or fried vegetables. Vegetables in a cheese sauce. Fruits Dried fruits. Canned fruit in light or heavy syrup. Fruit juice. Meat and Other Protein Products Fatty cuts of meat. Ribs, chicken wings, bacon, sausage, bologna, salami, chitterlings, fatback, hot dogs, bratwurst, and packaged luncheon meats. Dairy Whole or 2% milk, cream, half-and-half, and cream cheese. Whole-fat or sweetened yogurt. Full-fat cheeses. Nondairy creamers and whipped toppings. Processed cheese, cheese spreads, or cheese curds. Sweets and Desserts Corn syrup, sugars, honey, and molasses. Candy. Jam and jelly. Syrup. Sweetened cereals. Cookies, pies, cakes, donuts, muffins, and ice cream. Fats and Oils Butter, stick margarine, lard, shortening, ghee, or bacon fat. Coconut, palm kernel, or palm oils. Beverages Alcohol. Sweetened drinks (such as sodas, lemonade, and fruit drinks or punches). The items listed above may not be a complete list of foods and beverages to avoid. Contact your dietitian for more information. This information is not intended to replace advice given to you by your health care provider. Make sure you discuss any questions you have with your health care provider. Document Released: 05/08/2004 Document Revised: 12/27/2015 Document Reviewed: 05/25/2013 Elsevier Interactive Patient Education  2017 [employer].  Food Choices to Help Relieve Diarrhea, Adult When you have diarrhea, the  foods you eat and your eating habits are very important. Choosing the right foods and drinks can help:  Relieve diarrhea.  Replace lost fluids and nutrients.  Prevent dehydration.  What general guidelines should I follow? Relieving diarrhea  Choose foods with less than 2 g or .07 oz. of fiber per serving.  Limit fats to less than 8 tsp (38 g or 1.34 oz.) a day.  Avoid the following: ? Foods and beverages sweetened with high-fructose corn syrup, honey, or sugar alcohols such as xylitol, sorbitol, and mannitol. ? Foods that contain a lot of fat or sugar. ? Fried, greasy, or spicy foods. ? High-fiber grains, breads, and cereals. ? Raw fruits and vegetables.  Eat foods that are rich in probiotics. These foods include dairy products such as yogurt and fermented milk products. They help increase healthy bacteria in the stomach and intestines (gastrointestinal tract, or GI tract).  If you have lactose intolerance, avoid dairy products. These may make your diarrhea worse.  Take medicine to help stop diarrhea (antidiarrheal medicine) only as told by your health care provider. Replacing nutrients  Eat small meals or snacks every 3-4 hours.  Eat bland foods, such as white rice, toast, or baked potato, until your diarrhea starts to get better. Gradually reintroduce nutrient-rich foods as tolerated or as told by your health care provider. This includes: ? Well-cooked protein foods. ? Peeled, seeded, and soft-cooked fruits and vegetables. ? Low-fat dairy products.  Take vitamin and mineral supplements as told by your health care provider. Preventing dehydration   Start by sipping water or a special solution to prevent dehydration (oral rehydration solution, ORS). Urine that is clear or pale yellow means that you are getting enough fluid.  Try to drink at least 8-10 cups of fluid each day to help replace lost fluids.  You may add other liquids in addition to water, such as clear juice or  decaffeinated sports drinks, as tolerated or as told by your health care provider.  Avoid drinks with caffeine, such as coffee, tea, or soft drinks.  Avoid alcohol. What foods are recommended? The items listed may not be a complete list. Talk with your health care provider about what dietary choices are best for you. Grains White rice. White, [place], or pita breads (fresh or toasted), including plain rolls, buns, or bagels. White pasta. Saltine, soda, or graham crackers. Pretzels. Low-fiber cereal. Cooked cereals made with water (such as cornmeal, farina, or cream cereals). Plain muffins. Matzo. Melba toast. Zwieback. Vegetables Potatoes (without the skin). Most well-cooked and canned vegetables without skins or seeds. Tender lettuce. Fruits Apple sauce. Fruits canned in juice. Cooked apricots, cherries, grapefruit, peaches, pears, or plums. Fresh bananas and cantaloupe. Meats and other protein foods Baked or boiled chicken. Eggs. Tofu. Fish. Seafood. Smooth nut butters. Ground or well-cooked tender beef, ham, veal, lamb, pork, or poultry. Dairy Plain yogurt, kefir, and unsweetened liquid yogurt. Lactose-free milk, buttermilk, skim milk, or soy milk. Low-fat or nonfat hard cheese. Beverages Water. Low-calorie sports drinks. Fruit juices without pulp. Strained tomato and vegetable juices. Decaffeinated teas. Sugar-free beverages not sweetened with sugar alcohols. Oral rehydration solutions, if approved by your health care provider. Seasoning and other foods Bouillon, broth, or soups made from recommended foods. What foods are not recommended? The items listed may not be a complete list. Talk with your health care provider about what dietary choices are best for you. Grains Whole grain, whole  wheat, bran, or rye breads, rolls, pastas, and crackers. Wild or brown rice. Whole grain or bran cereals. Barley. Oats and oatmeal. Corn tortillas or taco shells. Granola. Popcorn. Vegetables Raw  vegetables. Fried vegetables. Cabbage, broccoli, Brussels sprouts, artichokes, baked beans, beet greens, corn, kale, legumes, peas, sweet potatoes, and yams. Potato skins. Cooked spinach and cabbage. Fruits Dried fruit, including raisins and dates. Raw fruits. Stewed or dried prunes. Canned fruits with syrup. Meat and other protein foods Fried or fatty meats. Deli meats. Chunky nut butters. Nuts and seeds. Beans and lentils. Berniece Salines. Hot dogs. Sausage. Dairy High-fat cheeses. Whole milk, chocolate milk, and beverages made with milk, such as milk shakes. Half-and-half. Cream. sour cream. Ice cream. Beverages Caffeinated beverages (such as coffee, tea, soda, or energy drinks). Alcoholic beverages. Fruit juices with pulp. Prune juice. Soft drinks sweetened with high-fructose corn syrup or sugar alcohols. High-calorie sports drinks. Fats and oils Butter. Cream sauces. Margarine. Salad oils. Plain salad dressings. Olives. Avocados. Mayonnaise. Sweets and desserts Sweet rolls, doughnuts, and sweet breads. Sugar-free desserts sweetened with sugar alcohols such as xylitol and sorbitol. Seasoning and other foods Honey. Hot sauce. Chili powder. Gravy. Cream-based or milk-based soups. Pancakes and waffles. Summary  When you have diarrhea, the foods you eat and your eating habits are very important.  Make sure you get at least 8-10 cups of fluid each day, or enough to keep your urine clear or pale yellow.  Eat bland foods and gradually reintroduce healthy, nutrient-rich foods as tolerated, or as told by your health care provider.  Avoid high-fiber, fried, greasy, or spicy foods. This information is not intended to replace advice given to you by your health care provider. Make sure you discuss any questions you have with your health care provider. Document Released: 10/11/2003 Document Revised: 07/18/2016 Document Reviewed: 07/18/2016 Elsevier Interactive Patient Education  [employer].

## 2017-12-24 ENCOUNTER — Other Ambulatory Visit: Payer: Self-pay | Admitting: Family Medicine

## 2017-12-24 ENCOUNTER — Other Ambulatory Visit: Payer: Self-pay | Admitting: *Deleted

## 2017-12-24 DIAGNOSIS — E038 Other specified hypothyroidism: Secondary | ICD-10-CM

## 2017-12-24 DIAGNOSIS — N183 Chronic kidney disease, stage 3 unspecified: Secondary | ICD-10-CM

## 2017-12-24 DIAGNOSIS — E039 Hypothyroidism, unspecified: Secondary | ICD-10-CM

## 2017-12-29 ENCOUNTER — Other Ambulatory Visit: Payer: Self-pay | Admitting: Family Medicine

## 2018-01-17 ENCOUNTER — Other Ambulatory Visit: Payer: Self-pay | Admitting: Family Medicine

## 2018-02-01 ENCOUNTER — Other Ambulatory Visit (INDEPENDENT_AMBULATORY_CARE_PROVIDER_SITE_OTHER): Payer: Medicare Other

## 2018-02-01 DIAGNOSIS — E039 Hypothyroidism, unspecified: Secondary | ICD-10-CM

## 2018-02-01 DIAGNOSIS — N183 Chronic kidney disease, stage 3 unspecified: Secondary | ICD-10-CM

## 2018-02-01 DIAGNOSIS — E038 Other specified hypothyroidism: Secondary | ICD-10-CM

## 2018-02-01 LAB — BASIC METABOLIC PANEL
BUN: 19 mg/dL (ref 6–23)
CALCIUM: 9.4 mg/dL (ref 8.4–10.5)
CO2: 27 meq/L (ref 19–32)
CREATININE: 1.44 mg/dL (ref 0.40–1.50)
Chloride: 108 mEq/L (ref 96–112)
GFR: 51.42 mL/min — ABNORMAL LOW (ref >60.00)
GLUCOSE: 98 mg/dL (ref 70–99)
Potassium: 3.8 mEq/L (ref 3.5–5.1)
Sodium: 142 mEq/L (ref 135–145)

## 2018-02-01 LAB — TSH: TSH: 0.65 u[IU]/mL (ref 0.35–4.50)

## 2018-02-01 LAB — T4, FREE: FREE T4: 0.57 ng/dL — AB (ref 0.60–1.60)

## 2018-02-02 ENCOUNTER — Encounter: Payer: Self-pay | Admitting: Family Medicine

## 2018-02-03 DIAGNOSIS — C61 Malignant neoplasm of prostate: Secondary | ICD-10-CM | POA: Diagnosis not present

## 2018-02-03 DIAGNOSIS — R351 Nocturia: Secondary | ICD-10-CM | POA: Diagnosis not present

## 2018-02-15 ENCOUNTER — Telehealth: Payer: Self-pay | Admitting: Family Medicine

## 2018-02-15 NOTE — Telephone Encounter (Signed)
Refill of Vytorin by historical provider  LOV 12/23/17 Dr. Volanda Napoleon

## 2018-02-15 NOTE — Telephone Encounter (Signed)
Copied from [phone]. Topic: Quick Communication - See Telephone Encounter >> Feb 15, 2018  9:50 AM Conception Chancy, NT wrote: CRM for notification. See Telephone encounter for: 02/15/18.  Patient is calling and requesting a refill on ezetimibe-simvastatin (VYTORIN) 10-40 MG per tablet. He states that Dr. Volanda Napoleon has refilled it before but is unsure why it keeps showing his previous doctor name on the bottle.   [facility] - [address] [place] [address] Phone: [phone] Fax: [phone]

## 2018-02-16 ENCOUNTER — Other Ambulatory Visit: Payer: Self-pay

## 2018-02-16 ENCOUNTER — Other Ambulatory Visit: Payer: Self-pay | Admitting: Family Medicine

## 2018-02-16 MED ORDER — EZETIMIBE-SIMVASTATIN 10-40 MG PO TABS: 1.0000 | ORAL_TABLET | Freq: Every day | ORAL | 0 refills | Status: DC

## 2018-02-16 NOTE — Telephone Encounter (Signed)
Refill has been sent to pharmacy.  

## 2018-02-22 DIAGNOSIS — H26491 Other secondary cataract, right eye: Secondary | ICD-10-CM | POA: Diagnosis not present

## 2018-02-22 DIAGNOSIS — H04209 Unspecified epiphora, unspecified lacrimal gland: Secondary | ICD-10-CM | POA: Diagnosis not present

## 2018-02-26 ENCOUNTER — Other Ambulatory Visit: Payer: Self-pay | Admitting: Family Medicine

## 2018-02-26 DIAGNOSIS — N183 Chronic kidney disease, stage 3 unspecified: Secondary | ICD-10-CM

## 2018-03-12 ENCOUNTER — Other Ambulatory Visit: Payer: Self-pay | Admitting: Family Medicine

## 2018-03-12 ENCOUNTER — Encounter: Payer: Self-pay | Admitting: Family Medicine

## 2018-03-12 ENCOUNTER — Ambulatory Visit (INDEPENDENT_AMBULATORY_CARE_PROVIDER_SITE_OTHER): Payer: Medicare Other | Admitting: Family Medicine

## 2018-03-12 VITALS — BP 124/74 | HR 74 | Temp 98.4°F | Wt 173.0 lb

## 2018-03-12 DIAGNOSIS — Z1211 Encounter for screening for malignant neoplasm of colon: Secondary | ICD-10-CM

## 2018-03-12 DIAGNOSIS — R0989 Other specified symptoms and signs involving the circulatory and respiratory systems: Secondary | ICD-10-CM | POA: Diagnosis not present

## 2018-03-12 NOTE — Progress Notes (Signed)
Subjective:    Patient ID: Luis Villegas, male    DOB: 09/25/1945, 72 y.o.   MRN: [id number]  No chief complaint on file.   HPI Patient was seen today for f/u on on several concerns.  Pt notes a pulsing sensation in b/l ears that occurred twice.  Pt states one episode woke him up in the middle of the night. The episodes did not last long.  Pt denies pain.  Pt has a history of tenderness at baseline.    Pt requesting referral for colonoscopy by Dr. Fritzi Mandes.  Last colonoscopy 5 yrs ago in [place], several polyps noted.  Pt requesting Tdap, as going on a mission trip in a few months.   Of note: Pt advised his referral for nephrology was placed.  Pt seemed confused.  This provider advised pt of my chart emails in regards to slight improvement in CKD stage III.  Pt states he was unaware of emails as they was written by his daughter (an Charity fundraiser at [employer]).  Past Medical History:  Diagnosis Date  . Cancer [facility])    prostate cancer  . Colon polyps   . GERD (gastroesophageal reflux disease)   . Heart murmur   . Hyperlipidemia     Allergies  Allergen Reactions  . Lipitor [Atorvastatin Calcium]     myalgias    ROS General: Denies fever, chills, night sweats, changes in weight, changes in appetite HEENT: Denies headaches, ear pain, changes in vision, rhinorrhea, sore throat   +pulsation in b/l ears. CV: Denies CP, palpitations, SOB, orthopnea Pulm: Denies SOB, cough, wheezing GI: Denies abdominal pain, nausea, vomiting, diarrhea, constipation GU: Denies dysuria, hematuria, frequency, vaginal discharge Msk: Denies muscle cramps, joint pains Neuro: Denies weakness, numbness, tingling Skin: Denies rashes, bruising Psych: Denies depression, anxiety, hallucinations     Objective:    Blood pressure 124/74, pulse 74, temperature 98.4 F (36.9 C), temperature source Oral, weight 173 lb (78.5 kg), SpO2 97 %.   Gen. Pleasant, well-nourished, in no distress, normal affect    HEENT: [place]/AT, face symmetric, no scleral icterus, PERRLA, nares patent without drainage. TMs normal b/l. No cervical lymphadenopathy. Neck: No JVD, b/l carotid bruits Lungs: no accessory muscle use, CTAB, no wheezes or rales Cardiovascular: RRR, no m/r/g, no peripheral edema Neuro:  A&Ox3, CN II-XII intact, normal gait  Wt Readings from Last 3 Encounters:  03/12/18 173 lb (78.5 kg)  12/23/17 174 lb (78.9 kg)  07/08/17 173 lb 9.6 oz (78.7 kg)    Lab Results  Component Value Date   WBC 5.2 12/03/2016   HGB 13.5 12/03/2016   HCT 44 12/03/2016   PLT 237 12/03/2016   GLUCOSE 98 02/01/2018   CHOL 142 12/23/2017   TRIG 148.0 12/23/2017   HDL 40.80 12/23/2017   LDLCALC 72 12/23/2017   ALT 21 12/23/2017   AST 17 12/23/2017   NA 142 02/01/2018   K 3.8 02/01/2018   CL 108 02/01/2018   CREATININE 1.44 02/01/2018   BUN 19 02/01/2018   CO2 27 02/01/2018   TSH 0.65 02/01/2018   PSA 2.19 12/03/2016   HGBA1C 6.1 12/23/2017    Assessment/Plan:  Bilateral carotid bruits  -unclear if pulsing sensation 2/2 elevated bp though typically well controlled, 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 -We will obtain carotid ultrasound -Continue Vytorin 10-40 mg daily - Plan: US Carotid Duplex Bilateral  Screen for colon cancer  - Plan: Ambulatory referral to Gastroenterology  Follow-up PRN  Abbe Amsterdam, MD

## 2018-03-12 NOTE — Patient Instructions (Signed)
Your next Tdap is due on 04/13/18.

## 2018-03-16 ENCOUNTER — Other Ambulatory Visit: Payer: Self-pay | Admitting: Family Medicine

## 2018-03-18 ENCOUNTER — Encounter (HOSPITAL_COMMUNITY): Payer: Medicare Other

## 2018-03-23 ENCOUNTER — Ambulatory Visit (HOSPITAL_COMMUNITY)
Admission: RE | Admit: 2018-03-23 | Discharge: 2018-03-23 | Disposition: A | Discharge status: Home / Self Care | Payer: Medicare Other | Source: Ambulatory Visit | Attending: Cardiovascular Disease | Admitting: Cardiovascular Disease

## 2018-03-23 DIAGNOSIS — R0989 Other specified symptoms and signs involving the circulatory and respiratory systems: Secondary | ICD-10-CM

## 2018-04-07 DIAGNOSIS — Z23 Encounter for immunization: Secondary | ICD-10-CM | POA: Diagnosis not present

## 2018-04-09 ENCOUNTER — Ambulatory Visit (INDEPENDENT_AMBULATORY_CARE_PROVIDER_SITE_OTHER): Payer: Medicare Other | Admitting: Orthopedic Surgery

## 2018-04-09 ENCOUNTER — Encounter (INDEPENDENT_AMBULATORY_CARE_PROVIDER_SITE_OTHER): Payer: Self-pay | Admitting: Orthopedic Surgery

## 2018-04-09 DIAGNOSIS — M654 Radial styloid tenosynovitis [de Quervain]: Secondary | ICD-10-CM | POA: Diagnosis not present

## 2018-04-09 MED ORDER — METHYLPREDNISOLONE ACETATE 40 MG/ML IJ SUSP
13.3300 mg | INTRAMUSCULAR | Status: AC | PRN
  Administered 2018-04-09: 13.33 mg

## 2018-04-09 MED ORDER — LIDOCAINE HCL 1 % IJ SOLN
3.0000 mL | INTRAMUSCULAR | Status: AC | PRN
  Administered 2018-04-09: 3 mL

## 2018-04-09 MED ORDER — BUPIVACAINE HCL 0.5 % IJ SOLN
0.6600 mL | INTRAMUSCULAR | Status: AC | PRN
  Administered 2018-04-09: .66 mL

## 2018-04-09 NOTE — Progress Notes (Signed)
Office Visit Note   Patient: Luis Villegas           Date of Birth: 1945-10-21           MRN: [id number] Visit Date: 04/09/2018 Requested by: Billie Ruddy, MD [place], [place] [address] PCP: Billie Ruddy, MD  Subjective: Chief Complaint  Patient presents with  . Left Wrist - Follow-up    HPI: Luis Villegas is a patient with left wrist pain.  Last injection into the first dorsal compartment 10/28/2017.  Would like to have repeat injection today.  He is been using anti-inflammatories in the splint but his symptoms have recurred.  Patient is retired.              ROS: All systems reviewed are negative as they relate to the chief complaint within the history of present illness.  Patient denies  fevers or chills.   Assessment & Plan: Visit Diagnoses:  1. De Quervain's tenosynovitis, left     Plan: Impression is left wrist pain de Quervain's tenosynovitis.  Plan is injection today.  Continue with nonoperative management.  Would really want to do too many more injections into that area because of the risk of tendon rupture.  Follow-up as needed  Follow-Up Instructions: Return if symptoms worsen or fail to improve.   Orders:  No orders of the defined types were placed in this encounter.  No orders of the defined types were placed in this encounter.     Procedures: Hand/UE Inj: L extensor compartment 1 for de Quervain's tenosynovitis on 04/09/2018 9:37 PM Indications: therapeutic Details: 25 G needle, radial approach Medications: 0.66 mL bupivacaine 0.5 %; 13.33 mg methylPREDNISolone acetate 40 MG/ML; 3 mL lidocaine 1 % Outcome: tolerated well, no immediate complications Procedure, treatment alternatives, risks and benefits explained, specific risks discussed. Consent was given by the patient. Immediately prior to procedure a time out was called to verify the correct patient, procedure, equipment, support staff and site/side marked as required. Patient  was prepped and draped in the usual sterile fashion.       Clinical Data: No additional findings.  Objective: Vital Signs: There were no vitals taken for this visit.  Physical Exam:   Constitutional: Patient appears well-developed HEENT:  Head: Normocephalic Eyes:EOM are normal Neck: Normal range of motion Cardiovascular: Normal rate Pulmonary/chest: Effort normal Neurologic: Patient is alert Skin: Skin is warm Psychiatric: Patient has normal mood and affect    Ortho Exam: Ortho exam demonstrates full active and passive range of motion of that left wrist with tenderness over the first dorsal compartment.  Finkelstein's test positive.  Radial pulse intact.  No snuffbox tenderness.  Grip strength intact.  Specialty Comments:  No specialty comments available.  Imaging: No results found.   PMFS History: Patient Active Problem List   Diagnosis Date Noted  . VENTRAL HERNIA 11/23/2008  . COLONIC POLYPS, HX OF 11/23/2008  . HYPERLIPIDEMIA 11/11/2007  . CARPAL TUNNEL SYNDROME 11/11/2007  . GERD 11/11/2007  . SLEEP APNEA 11/11/2007  . PROSTATE CANCER, HX OF 11/11/2007  . ANXIETY STATE NOS 05/18/2007  . MIGRAINE NEC W/INTRACTABLE MIGRAINE 03/23/2007  . ANEMIA-NOS 09/23/2006   Past Medical History:  Diagnosis Date  . Cancer [facility])    prostate cancer  . Colon polyps   . GERD (gastroesophageal reflux disease)   . Heart murmur   . Hyperlipidemia     Family History  Problem Relation Age of Onset  . Cancer Father  Bladder Cancer  . Stroke Father 31  . Breast cancer Mother   . Diabetes Mother   . Neuropathy Mother   . Valvular heart disease Mother   . Heart attack Mother 41  . Colon polyps Brother   . Prostate cancer Brother   . Valvular heart disease Brother   . Heart attack Brother 28    Past Surgical History:  Procedure Laterality Date  . CARPAL TUNNEL RELEASE    . CERVICAL FUSION    . COLONOSCOPY  2008  . COLONOSCOPY W/ POLYPECTOMY  2002  .  PROSTATECTOMY  2003   Dr.Davis  . TONSILLECTOMY     Social History   Occupational History  . Not on file  Tobacco Use  . Smoking status: Former Smoker    Last attempt to quit: 08/04/1993    Years since quitting: 24.6  . Smokeless tobacco: Never Used  Substance and Sexual Activity  . Alcohol use: No  . Drug use: No  . Sexual activity: Not on file

## 2018-04-11 ENCOUNTER — Other Ambulatory Visit: Payer: Self-pay | Admitting: Family Medicine

## 2018-04-29 ENCOUNTER — Other Ambulatory Visit: Payer: Self-pay | Admitting: Family Medicine

## 2018-05-04 ENCOUNTER — Encounter: Payer: Self-pay | Admitting: Family Medicine

## 2018-05-04 ENCOUNTER — Ambulatory Visit (INDEPENDENT_AMBULATORY_CARE_PROVIDER_SITE_OTHER): Payer: Medicare Other | Admitting: Family Medicine

## 2018-05-04 VITALS — BP 102/68 | HR 68 | Temp 98.2°F | Wt 173.0 lb

## 2018-05-04 DIAGNOSIS — Z23 Encounter for immunization: Secondary | ICD-10-CM | POA: Diagnosis not present

## 2018-05-04 DIAGNOSIS — K580 Irritable bowel syndrome with diarrhea: Secondary | ICD-10-CM | POA: Diagnosis not present

## 2018-05-04 NOTE — Progress Notes (Signed)
Subjective:    Patient ID: Luis Villegas, male    DOB: Nov 16, 1945, 72 y.o.   MRN: [id number]  No chief complaint on file.   HPI Patient was seen today for ongoing concern.  Patient endorses history of IBS-D.  Now with worsening symptoms.  Patient has history of radiation for prostate cancer and thinks is related to his symptoms.  Patient has a brother with similar issues.  Patient notes having to wake up at least 4 hours prior to being somewhere 2/2 diarrhea.  Patient often wakes to eat in the morning if he knows he has an appointment.  Pt endorses several occasions of encopresis. Pt taking Imodium with some relief.  Has tried increasing fiber, but symptoms become worse.  Pt also requesting Tdap vaccine.  Past Medical History:  Diagnosis Date  . Cancer [facility])    prostate cancer  . Colon polyps   . GERD (gastroesophageal reflux disease)   . Heart murmur   . Hyperlipidemia     Allergies  Allergen Reactions  . Lipitor [Atorvastatin Calcium]     myalgias    ROS General: Denies fever, chills, night sweats, changes in weight, changes in appetite HEENT: Denies headaches, ear pain, changes in vision, rhinorrhea, sore throat CV: Denies CP, palpitations, SOB, orthopnea Pulm: Denies SOB, cough, wheezing GI: Denies abdominal pain, nausea, vomiting, constipation  +h/o IBS-diarrhea GU: Denies dysuria, hematuria, frequency, vaginal discharge Msk: Denies muscle cramps, joint pains Neuro: Denies weakness, numbness, tingling Skin: Denies rashes, bruising Psych: Denies depression, anxiety, hallucinations     Objective:    Blood pressure 102/68, pulse 68, temperature 98.2 F (36.8 C), temperature source Oral, weight 173 lb (78.5 kg), SpO2 97 %.   Gen. Pleasant, well-nourished, in no distress, normal affect   Lungs: no accessory muscle use, CTAB, no wheezes or rales Cardiovascular: RRR, no m/r/g, no peripheral edema Abdomen: BS active, soft, NT/ND, no hepatosplenomegaly. Neuro:   A&Ox3, CN II-XII intact, normal gait   Wt Readings from Last 3 Encounters:  05/04/18 173 lb (78.5 kg)  03/12/18 173 lb (78.5 kg)  12/23/17 174 lb (78.9 kg)    Lab Results  Component Value Date   WBC 5.2 12/03/2016   HGB 13.5 12/03/2016   HCT 44 12/03/2016   PLT 237 12/03/2016   GLUCOSE 98 02/01/2018   CHOL 142 12/23/2017   TRIG 148.0 12/23/2017   HDL 40.80 12/23/2017   LDLCALC 72 12/23/2017   ALT 21 12/23/2017   AST 17 12/23/2017   NA 142 02/01/2018   K 3.8 02/01/2018   CL 108 02/01/2018   CREATININE 1.44 02/01/2018   BUN 19 02/01/2018   CO2 27 02/01/2018   TSH 0.65 02/01/2018   PSA 2.19 12/03/2016   HGBA1C 6.1 12/23/2017    Assessment/Plan:  Irritable bowel syndrome with diarrhea  -Discussed trying a probiotic -Given a list of foods that may make symptoms better/worse - Plan: Ambulatory referral to Gastroenterology -Reviewed copy of patient's colonoscopy from 2016.  Will have scanned into chart.  Need for Tdap vaccination  - Plan: Tdap vaccine greater than or equal to 7yo IM  Follow-up PRN  Grier Mitts, MD

## 2018-05-04 NOTE — Patient Instructions (Signed)
Irritable Bowel Syndrome, Adult Irritable bowel syndrome (IBS) is not one specific disease. It is a group of symptoms that affects the organs responsible for digestion (gastrointestinal or GI tract). To regulate how your GI tract works, your body sends signals back and forth between your intestines and your brain. If you have IBS, there may be a problem with these signals. As a result, your GI tract does not function normally. Your intestines may become more sensitive and overreact to certain things. This is especially true when you eat certain foods or when you are under stress. There are four types of IBS. These may be determined based on the consistency of your stool:  IBS with diarrhea.  IBS with constipation.  Mixed IBS.  Unsubtyped IBS.  It is important to know which type of IBS you have. Some treatments are more likely to be helpful for certain types of IBS. What are the causes? The exact cause of IBS is not known. What increases the risk? You may have a higher risk of IBS if:  You are a woman.  You are younger than 71 years old.  You have a family history of IBS.  You have mental health problems.  You have had bacterial infection of your GI tract.  What are the signs or symptoms? Symptoms of IBS vary from person to person. The main symptom is abdominal pain or discomfort. Additional symptoms usually include one or more of the following:  Diarrhea, constipation, or both.  Abdominal swelling or bloating.  Feeling full or sick after eating a small or regular-size meal.  Frequent gas.  Mucus in the stool.  A feeling of having more stool left after a bowel movement.  Symptoms tend to come and go. They may be associated with stress, psychiatric conditions, or nothing at all. How is this diagnosed? There is no specific test to diagnose IBS. Your health care provider will make a diagnosis based on a physical exam, medical history, and your symptoms. You may have other  tests to rule out other conditions that may be causing your symptoms. These may include:  Blood tests.  X-rays.  CT scan.  Endoscopy and colonoscopy. This is a test in which your GI tract is viewed with a long, thin, flexible tube.  How is this treated? There is no cure for IBS, but treatment can help relieve symptoms. IBS treatment often includes:  Changes to your diet, such as: ? Eating more fiber. ? Avoiding foods that cause symptoms. ? Drinking more water. ? Eating regular, medium-sized portioned meals.  Medicines. These may include: ? Fiber supplements if you have constipation. ? Medicine to control diarrhea (antidiarrheal medicines). ? Medicine to help control muscle spasms in your GI tract (antispasmodic medicines). ? Medicines to help with any mental health issues, such as antidepressants or tranquilizers.  Therapy. ? Talk therapy may help with anxiety, depression, or other mental health issues that can make IBS symptoms worse.  Stress reduction. ? Managing your stress can help keep symptoms under control.  Follow these instructions at home:  Take medicines only as directed by your health care provider.  Eat a healthy diet. ? Avoid foods and drinks with added sugar. ? Include more whole grains, fruits, and vegetables gradually into your diet. This may be especially helpful if you have IBS with constipation. ? Avoid any foods and drinks that make your symptoms worse. These may include dairy products and caffeinated or carbonated drinks. ? Do not eat large meals. ? Drink enough   fluid to keep your urine clear or pale yellow.  Exercise regularly. Ask your health care provider for recommendations of good activities for you.  Keep all follow-up visits as directed by your health care provider. This is important. Contact a health care provider if:  You have constant pain.  You have trouble or pain with swallowing.  You have worsening diarrhea. Get help right away  if:  You have severe and worsening abdominal pain.  You have diarrhea and: ? You have a rash, stiff neck, or severe headache. ? You are irritable, sleepy, or difficult to awaken. ? You are weak, dizzy, or extremely thirsty.  You have bright red blood in your stool or you have black tarry stools.  You have unusual abdominal swelling that is painful.  You vomit continuously.  You vomit blood (hematemesis).  You have both abdominal pain and a fever. This information is not intended to replace advice given to you by your health care provider. Make sure you discuss any questions you have with your health care provider. Document Released: 07/21/2005 Document Revised: 12/21/2015 Document Reviewed: 04/07/2014 Elsevier Interactive Patient Education  2018 Elsevier Inc. Diet for Irritable Bowel Syndrome When you have irritable bowel syndrome (IBS), the foods you eat and your eating habits are very important. IBS may cause various symptoms, such as abdominal pain, constipation, or diarrhea. Choosing the right foods can help ease discomfort caused by these symptoms. Work with your health care provider and dietitian to find the best eating plan to help control your symptoms. What general guidelines do I need to follow?  Keep a food diary. This will help you identify foods that cause symptoms. Write down: ? What you eat and when. ? What symptoms you have. ? When symptoms occur in relation to your meals.  Avoid foods that cause symptoms. Talk with your dietitian about other ways to get the same nutrients that are in these foods.  Eat more foods that contain fiber. Take a fiber supplement if directed by your dietitian.  Eat your meals slowly, in a relaxed setting.  Aim to eat 5-6 small meals per day. Do not skip meals.  Drink enough fluids to keep your urine clear or pale yellow.  Ask your health care provider if you should take an over-the-counter probiotic during flare-ups to help restore  healthy gut bacteria.  If you have cramping or diarrhea, try making your meals low in fat and high in carbohydrates. Examples of carbohydrates are pasta, rice, whole grain breads and cereals, fruits, and vegetables.  If dairy products cause your symptoms to flare up, try eating less of them. You might be able to handle yogurt better than other dairy products because it contains bacteria that help with digestion. What foods are not recommended? The following are some foods and drinks that may worsen your symptoms:  Fatty foods, such as French fries.  Milk products, such as cheese or ice cream.  Chocolate.  Alcohol.  Products with caffeine, such as coffee.  Carbonated drinks, such as soda.  The items listed above may not be a complete list of foods and beverages to avoid. Contact your dietitian for more information. What foods are good sources of fiber? Your health care provider or dietitian may recommend that you eat more foods that contain fiber. Fiber can help reduce constipation and other IBS symptoms. Add foods with fiber to your diet a little at a time so that your body can get used to them. Too much fiber at once   might cause gas and swelling of your abdomen. The following are some foods that are good sources of fiber:  Apples.  Peaches.  Pears.  Berries.  Figs.  Broccoli (raw).  Cabbage.  Carrots.  Raw peas.  Kidney beans.  Lima beans.  Whole grain bread.  Whole grain cereal.  Where to find more information: International Foundation for Functional Gastrointestinal Disorders: www.iffgd.org National Institute of Diabetes and Digestive and Kidney Diseases: www.niddk.nih.gov/health-information/health-topics/digestive-diseases/ibs/Pages/facts.aspx This information is not intended to replace advice given to you by your health care provider. Make sure you discuss any questions you have with your health care provider. Document Released: 10/11/2003 Document Revised:  12/27/2015 Document Reviewed: 10/21/2013 Elsevier Interactive Patient Education  2018 Elsevier Inc.  

## 2018-05-05 ENCOUNTER — Encounter: Payer: Self-pay | Admitting: Gastroenterology

## 2018-05-05 DIAGNOSIS — L570 Actinic keratosis: Secondary | ICD-10-CM | POA: Diagnosis not present

## 2018-05-05 DIAGNOSIS — R208 Other disturbances of skin sensation: Secondary | ICD-10-CM | POA: Diagnosis not present

## 2018-05-05 DIAGNOSIS — D229 Melanocytic nevi, unspecified: Secondary | ICD-10-CM | POA: Diagnosis not present

## 2018-05-05 DIAGNOSIS — L821 Other seborrheic keratosis: Secondary | ICD-10-CM | POA: Diagnosis not present

## 2018-05-13 ENCOUNTER — Other Ambulatory Visit: Payer: Self-pay | Admitting: Family Medicine

## 2018-06-01 ENCOUNTER — Other Ambulatory Visit: Payer: Self-pay | Admitting: Family Medicine

## 2018-06-08 ENCOUNTER — Ambulatory Visit (INDEPENDENT_AMBULATORY_CARE_PROVIDER_SITE_OTHER): Payer: Medicare Other | Admitting: Gastroenterology

## 2018-06-08 ENCOUNTER — Encounter: Payer: Self-pay | Admitting: Gastroenterology

## 2018-06-08 VITALS — BP 124/70 | HR 72 | Ht 66.75 in | Wt 176.4 lb

## 2018-06-08 DIAGNOSIS — K529 Noninfective gastroenteritis and colitis, unspecified: Secondary | ICD-10-CM

## 2018-06-08 DIAGNOSIS — Z86010 Personal history of colonic polyps: Secondary | ICD-10-CM

## 2018-06-08 MED ORDER — ELUXADOLINE 100 MG PO TABS: 100.0000 mg | ORAL_TABLET | Freq: Two times a day (BID) | ORAL | 0 refills | Status: DC

## 2018-06-08 NOTE — Patient Instructions (Signed)
If you are age 71 or older, your body mass index should be between 23-30. Your Body mass index is 27.83 kg/m. If this is out of the aforementioned range listed, please consider follow up with your Primary Care Provider.  If you are age 21 or younger, your body mass index should be between 19-25. Your Body mass index is 27.83 kg/m. If this is out of the aformentioned range listed, please consider follow up with your Primary Care Provider.   We have given you samples of the following medication to take: Viberzi 100 mg one twice a day # 16 Lot # [id number] exp 12-02-2018  It was a pleasure to see you today!  Dr. Havery Moros

## 2018-06-08 NOTE — Progress Notes (Signed)
HPI :  72 y/o male with a history of reported IBS, prostate cancer s/p prostatectomy and radiation in 2003, GERD, here for a new patient evaluation of chronic diarrhea, referred by Grier Mitts MD  The patient reports he has had long-standing loose stools for several years, however the past year it become much worse. He had his last colonoscopy performed in January 2016 when he lived in [place], he had a few small adenomas on a exam, otherwise the colon was normal and biopsies were negative for microscopic colitis. Of note he otherwise endorses a history of radiation treatment to his prostate gland in 2003, he's had no known history of radiation proctitis.  He has about 5 bowel movements per day, they range from soft to loose. He denies any blood in his stools. He does have some urgency, often has the urge to have a bowel movement after he eats something. He denies any nocturnal symptoms. In general he thinks his bowel habits have been worsened in the past year. He has taken Imodium in the past although it is not as effective as it used to be. He does have some abdominal cramps in the mid abdomen which bother him at times. He denies any weight loss. No nausea or vomiting. He is otherwise eating very well. He denies any fevers. He denies any changes in his medicines. He denies any NSAID use. He uses Tylenol as needed.  Colonoscopy 08/09/2014 - 2 x 21mm polyps transverse colon, 84mm polyp rectum - done in [place] - random biopsies done and show no evidence of microscopic colitis - 1 polyp adenoma,1 polyp sessile serrated, 1 polyp hyperplastic Colonoscopy 03/2006 - Dr Deatra Ina - normal  Past Medical History:  Diagnosis Date  . Colon polyps 2016  . GERD (gastroesophageal reflux disease)   . Heart murmur   . Hyperlipidemia   . IBS (irritable bowel syndrome)   . Kidney stones   . Prostate cancer ([place])   . Sleep apnea with use of continuous positive airway pressure (CPAP)      Past Surgical  History:  Procedure Laterality Date  . CARPAL TUNNEL RELEASE Bilateral   . CERVICAL FUSION    . COLONOSCOPY  2008  . COLONOSCOPY W/ POLYPECTOMY  2002  . PROSTATECTOMY  2003   Dr.Davis  . TONSILLECTOMY     Family History  Problem Relation Age of Onset  . Stroke Father 40  . Bladder Cancer Father   . Breast cancer Mother   . Diabetes Mother   . Neuropathy Mother   . Valvular heart disease Mother   . Heart attack Mother 20  . Prostate cancer Brother   . Heart disease Brother   . Heart attack Brother   . Prostate cancer Brother    Social History   Tobacco Use  . Smoking status: Former Smoker    Last attempt to quit: 08/04/1993    Years since quitting: 24.8  . Smokeless tobacco: Never Used  Substance Use Topics  . Alcohol use: No  . Drug use: No   Current Outpatient Medications  Medication Sig Dispense Refill  . acyclovir (ZOVIRAX) 200 MG capsule TAKE ONE CAPSULE BY MOUTH EVERY DAY 90 capsule 3  . buPROPion (WELLBUTRIN XL) 150 MG 24 hr tablet TAKE 1 TABLET BY MOUTH EVERY DAY 90 tablet 3  . citalopram (CELEXA) 20 MG tablet TAKE ONE TABLET BY MOUTH ONE TIME DAILY 90 tablet 0  . ezetimibe-simvastatin (VYTORIN) 10-40 MG tablet TAKE 1 TABLET BY MOUTH AT  BEDTIME 30 tablet 0  . fluticasone (FLONASE) 50 MCG/ACT nasal spray Place 1 spray into the nose 2 (two) times daily as needed for rhinitis. 16 g 2  . omeprazole (PRILOSEC) 20 MG capsule TAKE 1 CAPSULE BY MOUTH EVERY DAY 90 capsule 1  . oxybutynin (DITROPAN) 5 MG tablet Take 5 mg by mouth daily at 12 noon.    . SUMAtriptan (IMITREX) 100 MG tablet TAKE 1 TABLET BY MOUTH AS NEEDED FOR HEADACHE. NEED TO SCHEDULE ANNUAL EXAM. 9 tablet 0  . topiramate (TOPAMAX) 200 MG tablet TAKE 1 TABLET BY MOUTH AT BEDTIME 90 tablet 0   No current facility-administered medications for this visit.    Allergies  Allergen Reactions  . Lipitor [Atorvastatin Calcium]     myalgias     Review of Systems: All systems reviewed and negative except where  noted in HPI.   Lab Results  Component Value Date   WBC 5.2 12/03/2016   HGB 13.5 12/03/2016   HCT 44 12/03/2016   MCV 94.7 11/17/2011   PLT 237 12/03/2016    Lab Results  Component Value Date   CREATININE 1.44 02/01/2018   BUN 19 02/01/2018   NA 142 02/01/2018   K 3.8 02/01/2018   CL 108 02/01/2018   CO2 27 02/01/2018    Lab Results  Component Value Date   ALT 21 12/23/2017   AST 17 12/23/2017   ALKPHOS 42 12/23/2017   BILITOT 0.5 12/23/2017     Physical Exam: BP 124/70 (BP Location: Left Arm, Patient Position: Sitting, Cuff Size: Normal)   Pulse 72   Ht 5' 6.75" (1.695 m) Comment: height measured without shoes  Wt 176 lb 6 oz (80 kg)   BMI 27.83 kg/m  Constitutional: Pleasant,well-developed, male in no acute distress. HEENT: Normocephalic and atraumatic. Conjunctivae are normal. No scleral icterus. Neck supple.  Cardiovascular: Normal rate, regular rhythm.  Pulmonary/chest: Effort normal and breath sounds normal. No wheezing, rales or rhonchi. Abdominal: Soft, nondistended, nontender. There are no masses palpable. No hepatomegaly. Extremities: no edema Lymphadenopathy: No cervical adenopathy noted. Neurological: Alert and oriented to person place and time. Skin: Skin is warm and dry. No rashes noted. Psychiatric: Normal mood and affect. Behavior is normal.   ASSESSMENT AND PLAN: 72 year old male here for new patient assessment the following issues:  Chronic diarrhea - while he has a remote history of radiation therapy to the prostate in 2003, and 2 colonoscopies since that time have not shown any radiation changes, I think radiation proctitis is less likely at this time. He's been tested for microscopic colitis and negative on his last colonoscopy. 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 We discussed what this is and potential treatment options. I counseled him on a low FODMAP diet and provided a dietary  handout regarding this, we'll see if that helps. I otherwise discussed options with him. He still has his gallbladder in place and no prior history of pancreatitis, I offered him a two-week trial of Viberzi 100 mg twice daily to see if that helps. If this helps control his diarrhea, we'll write him a prescription for it, but if this makes no difference and he continues to have symptoms, we may consider a flexible sigmoidoscopy to ensure no evidence of radiation proctitis, although again as above I think this may be less likely. He agreed with the plan, I asked him to call me in 2 weeks and let me know how he is doing.  Otherwise we'll repeat his colonoscopy in 2021 for history of adenomas.  History of colon adenomas - due for repeat colonoscopy Jan 2021.   [place] Cellar, MD [facility]  CC: Billie Ruddy, MD

## 2018-06-14 ENCOUNTER — Other Ambulatory Visit: Payer: Self-pay | Admitting: Family Medicine

## 2018-06-21 ENCOUNTER — Other Ambulatory Visit: Payer: Self-pay

## 2018-06-21 ENCOUNTER — Telehealth: Payer: Self-pay | Admitting: Gastroenterology

## 2018-06-21 MED ORDER — ELUXADOLINE 100 MG PO TABS: 100.0000 mg | ORAL_TABLET | Freq: Two times a day (BID) | ORAL | 3 refills | Status: DC

## 2018-06-21 NOTE — Progress Notes (Signed)
rx for Viberzi sent.

## 2018-06-21 NOTE — Telephone Encounter (Signed)
rx sent

## 2018-06-23 ENCOUNTER — Other Ambulatory Visit: Payer: Self-pay | Admitting: Family Medicine

## 2018-06-25 ENCOUNTER — Telehealth: Payer: Self-pay | Admitting: Gastroenterology

## 2018-06-25 NOTE — Telephone Encounter (Signed)
Contacted patient's insurance at phone [phone] and initiated prior authorization. Patient diagnosis K58.0 (IBS-D); previously tried and failed loperamide.   Attempted formulary exception since medication is not on formulary. Spoke with Tameka. She will send expedited request to pharmacy.  Left message advising pt.

## 2018-06-25 NOTE — Telephone Encounter (Signed)
Pt called inquiring about prescription for viberzi, he wants to know the status of it as he is not sure if his insurance will not cover it. He states that sxs are reocurring since he has been out of samples for few days and is wondering if he can be prescribed something similar. Pls call him.

## 2018-06-28 ENCOUNTER — Telehealth: Payer: Self-pay | Admitting: Gastroenterology

## 2018-06-28 NOTE — Telephone Encounter (Signed)
I'm glad this has helped but sorry to see that he cannot afford it. Unfortunately there is no generic option for this or other drug in it's class. He has failed immodium. He still has his gallbladder in place but we don't we try Colestid 1gm BID for the next few weeks to see if that helps. If no benefit after one week can increase to 2gm BID. He should make sure with pharmacist timing of when to take this in relation to his other meds. Thanks

## 2018-06-29 MED ORDER — COLESTIPOL HCL 1 G PO TABS: 1.0000 g | ORAL_TABLET | Freq: Two times a day (BID) | ORAL | 1 refills | Status: DC

## 2018-06-29 NOTE — Telephone Encounter (Signed)
Called and spoke to pt. He agreed to the plan as outlined. Prescription for Colestid sent to pt's pharmacy. Patient will discuss timing with pharmacist.

## 2018-07-24 ENCOUNTER — Other Ambulatory Visit: Payer: Self-pay | Admitting: Family Medicine

## 2018-08-09 ENCOUNTER — Other Ambulatory Visit: Payer: Self-pay | Admitting: Family Medicine

## 2018-08-10 ENCOUNTER — Other Ambulatory Visit: Payer: Self-pay | Admitting: Family Medicine

## 2018-08-10 NOTE — Telephone Encounter (Signed)
Pt LOV was 05/04/2018 and last refill was on 06/03/2018 for 9 tablets,ok to refill

## 2018-08-18 DIAGNOSIS — C61 Malignant neoplasm of prostate: Secondary | ICD-10-CM | POA: Diagnosis not present

## 2018-08-19 ENCOUNTER — Other Ambulatory Visit: Payer: Self-pay | Admitting: Family Medicine

## 2018-08-23 ENCOUNTER — Other Ambulatory Visit (HOSPITAL_COMMUNITY): Payer: Self-pay | Admitting: Urology

## 2018-08-23 DIAGNOSIS — R9721 Rising PSA following treatment for malignant neoplasm of prostate: Secondary | ICD-10-CM | POA: Diagnosis not present

## 2018-08-23 DIAGNOSIS — Z87442 Personal history of urinary calculi: Secondary | ICD-10-CM | POA: Diagnosis not present

## 2018-08-23 DIAGNOSIS — N489 Disorder of penis, unspecified: Secondary | ICD-10-CM | POA: Diagnosis not present

## 2018-08-23 DIAGNOSIS — C61 Malignant neoplasm of prostate: Secondary | ICD-10-CM

## 2018-08-23 DIAGNOSIS — R972 Elevated prostate specific antigen [PSA]: Secondary | ICD-10-CM

## 2018-08-23 DIAGNOSIS — R350 Frequency of micturition: Secondary | ICD-10-CM | POA: Diagnosis not present

## 2018-08-30 DIAGNOSIS — N489 Disorder of penis, unspecified: Secondary | ICD-10-CM | POA: Diagnosis not present

## 2018-09-02 ENCOUNTER — Ambulatory Visit (HOSPITAL_COMMUNITY)
Admission: RE | Admit: 2018-09-02 | Discharge: 2018-09-02 | Disposition: A | Discharge status: Home / Self Care | Payer: Medicare Other | Source: Ambulatory Visit | Attending: Urology | Admitting: Urology

## 2018-09-02 DIAGNOSIS — R972 Elevated prostate specific antigen [PSA]: Secondary | ICD-10-CM | POA: Diagnosis not present

## 2018-09-02 DIAGNOSIS — C61 Malignant neoplasm of prostate: Secondary | ICD-10-CM | POA: Insufficient documentation

## 2018-09-02 MED ORDER — AXUMIN (FLUCICLOVINE F 18) INJECTION
8.9300 | Freq: Once | INTRAVENOUS | Status: AC
  Administered 2018-09-02: 8.93 via INTRAVENOUS

## 2018-09-09 ENCOUNTER — Encounter: Payer: Self-pay | Admitting: Family Medicine

## 2018-09-20 ENCOUNTER — Encounter: Payer: Self-pay | Admitting: Family Medicine

## 2018-09-21 ENCOUNTER — Other Ambulatory Visit: Payer: Self-pay | Admitting: Family Medicine

## 2018-09-30 DIAGNOSIS — N200 Calculus of kidney: Secondary | ICD-10-CM | POA: Diagnosis not present

## 2018-09-30 DIAGNOSIS — R51 Headache: Secondary | ICD-10-CM | POA: Diagnosis not present

## 2018-09-30 DIAGNOSIS — N183 Chronic kidney disease, stage 3 (moderate): Secondary | ICD-10-CM | POA: Diagnosis not present

## 2018-10-04 ENCOUNTER — Other Ambulatory Visit: Payer: Self-pay | Admitting: Family Medicine

## 2018-10-06 ENCOUNTER — Other Ambulatory Visit: Payer: Self-pay | Admitting: Family Medicine

## 2018-10-06 DIAGNOSIS — N183 Chronic kidney disease, stage 3 unspecified: Secondary | ICD-10-CM

## 2018-10-23 ENCOUNTER — Other Ambulatory Visit: Payer: Self-pay | Admitting: Family Medicine

## 2018-12-12 ENCOUNTER — Other Ambulatory Visit: Payer: Self-pay | Admitting: Family Medicine

## 2018-12-13 ENCOUNTER — Other Ambulatory Visit: Payer: Self-pay | Admitting: Family Medicine

## 2018-12-13 NOTE — Telephone Encounter (Signed)
Ok to send refill  

## 2018-12-21 ENCOUNTER — Other Ambulatory Visit: Payer: Self-pay | Admitting: Family Medicine

## 2019-01-27 ENCOUNTER — Other Ambulatory Visit: Payer: Self-pay | Admitting: Family Medicine

## 2019-02-09 DIAGNOSIS — C61 Malignant neoplasm of prostate: Secondary | ICD-10-CM | POA: Diagnosis not present

## 2019-02-16 DIAGNOSIS — R351 Nocturia: Secondary | ICD-10-CM | POA: Diagnosis not present

## 2019-02-16 DIAGNOSIS — N5201 Erectile dysfunction due to arterial insufficiency: Secondary | ICD-10-CM | POA: Diagnosis not present

## 2019-02-16 DIAGNOSIS — C61 Malignant neoplasm of prostate: Secondary | ICD-10-CM | POA: Diagnosis not present

## 2019-02-16 DIAGNOSIS — D074 Carcinoma in situ of penis: Secondary | ICD-10-CM | POA: Diagnosis not present

## 2019-02-16 DIAGNOSIS — N200 Calculus of kidney: Secondary | ICD-10-CM | POA: Diagnosis not present

## 2019-03-13 ENCOUNTER — Other Ambulatory Visit: Payer: Self-pay | Admitting: Family Medicine

## 2019-03-14 NOTE — Telephone Encounter (Signed)
Dr. Banks please advise  

## 2019-03-15 ENCOUNTER — Other Ambulatory Visit: Payer: Self-pay | Admitting: Family Medicine

## 2019-03-22 ENCOUNTER — Other Ambulatory Visit: Payer: Self-pay | Admitting: Family Medicine

## 2019-03-25 DIAGNOSIS — Z23 Encounter for immunization: Secondary | ICD-10-CM | POA: Diagnosis not present

## 2019-04-06 ENCOUNTER — Encounter: Payer: Self-pay | Admitting: Family Medicine

## 2019-04-14 ENCOUNTER — Other Ambulatory Visit: Payer: Self-pay

## 2019-04-14 ENCOUNTER — Encounter: Payer: Self-pay | Admitting: Family Medicine

## 2019-04-14 ENCOUNTER — Ambulatory Visit (INDEPENDENT_AMBULATORY_CARE_PROVIDER_SITE_OTHER): Payer: Medicare Other | Admitting: Family Medicine

## 2019-04-14 VITALS — BP 124/68 | HR 80 | Temp 97.3°F | Wt 167.0 lb

## 2019-04-14 DIAGNOSIS — L309 Dermatitis, unspecified: Secondary | ICD-10-CM | POA: Diagnosis not present

## 2019-04-14 DIAGNOSIS — E782 Mixed hyperlipidemia: Secondary | ICD-10-CM | POA: Diagnosis not present

## 2019-04-14 DIAGNOSIS — K580 Irritable bowel syndrome with diarrhea: Secondary | ICD-10-CM | POA: Diagnosis not present

## 2019-04-14 DIAGNOSIS — R040 Epistaxis: Secondary | ICD-10-CM | POA: Diagnosis not present

## 2019-04-14 DIAGNOSIS — Z0000 Encounter for general adult medical examination without abnormal findings: Secondary | ICD-10-CM

## 2019-04-14 LAB — COMPREHENSIVE METABOLIC PANEL
ALT: 14 U/L (ref 0–53)
AST: 14 U/L (ref 0–37)
Albumin: 4.6 g/dL (ref 3.5–5.2)
Alkaline Phosphatase: 46 U/L (ref 39–117)
BUN: 20 mg/dL (ref 6–23)
CO2: 24 mEq/L (ref 19–32)
Calcium: 9.8 mg/dL (ref 8.4–10.5)
Chloride: 107 mEq/L (ref 96–112)
Creatinine, Ser: 1.56 mg/dL — ABNORMAL HIGH (ref 0.40–1.50)
GFR: 43.96 mL/min — ABNORMAL LOW (ref >60.00)
Glucose, Bld: 92 mg/dL (ref 70–99)
Potassium: 3.9 mEq/L (ref 3.5–5.1)
Sodium: 139 mEq/L (ref 135–145)
Total Bilirubin: 0.4 mg/dL (ref 0.2–1.2)
Total Protein: 7.4 g/dL (ref 6.0–8.3)

## 2019-04-14 LAB — CBC WITH DIFFERENTIAL/PLATELET
Basophils Absolute: 0 10*3/uL (ref 0.0–0.1)
Basophils Relative: 0.8 % (ref 0.0–3.0)
Eosinophils Absolute: 0.1 10*3/uL (ref 0.0–0.7)
Eosinophils Relative: 2 % (ref 0.0–5.0)
HCT: 38.9 % — ABNORMAL LOW (ref 39.0–52.0)
Hemoglobin: 12.9 g/dL — ABNORMAL LOW (ref 13.0–17.0)
Lymphocytes Relative: 37 % (ref 12.0–46.0)
Lymphs Abs: 2.2 10*3/uL (ref 0.7–4.0)
MCHC: 33.1 g/dL (ref 30.0–36.0)
MCV: 92.9 fl (ref 78.0–100.0)
Monocytes Absolute: 0.5 10*3/uL (ref 0.1–1.0)
Monocytes Relative: 9.1 % (ref 3.0–12.0)
Neutro Abs: 3 10*3/uL (ref 1.4–7.7)
Neutrophils Relative %: 51.1 % (ref 43.0–77.0)
Platelets: 243 10*3/uL (ref 150.0–400.0)
RBC: 4.19 Mil/uL — ABNORMAL LOW (ref 4.22–5.81)
RDW: 14.2 % (ref 11.5–15.5)
WBC: 5.9 10*3/uL (ref 4.0–10.5)

## 2019-04-14 LAB — LIPID PANEL
Cholesterol: 146 mg/dL (ref 0–200)
HDL: 43.6 mg/dL (ref >39.00)
LDL Cholesterol: 71 mg/dL (ref 0–99)
NonHDL: 102.29
Total CHOL/HDL Ratio: 3
Triglycerides: 156 mg/dL — ABNORMAL HIGH (ref 0.0–149.0)
VLDL: 31.2 mg/dL (ref 0.0–40.0)

## 2019-04-14 LAB — TSH: TSH: 0.65 u[IU]/mL (ref 0.35–4.50)

## 2019-04-14 LAB — T4, FREE: Free T4: 0.69 ng/dL (ref 0.60–1.60)

## 2019-04-14 MED ORDER — DICYCLOMINE HCL 20 MG PO TABS: 20.0000 mg | ORAL_TABLET | Freq: Two times a day (BID) | ORAL | 3 refills | Status: DC

## 2019-04-14 MED ORDER — TRIAMCINOLONE ACETONIDE 0.025 % EX CREA: 1.0000 "application " | TOPICAL_CREAM | Freq: Two times a day (BID) | CUTANEOUS | 0 refills | Status: DC

## 2019-04-14 NOTE — Progress Notes (Signed)
Subjective:     Luis Villegas is a 73 y.o. male and is here for a comprehensive physical exam. The patient reports problems - rash, IBS-D.  Pt with pruritic rash R upper chest x 1 wk.  Pt denies changes in soaps, lotions, or detergents.  Pt has a dog, but has not noticed any insects on her.  Pt has a h/o IBS-D, seen in the past by GI.  Started on Colestipol but did not notice an improvement in symptoms.  Viberzi was too expensive.  Pt endorses 4 loose stools per day.  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  Pt also notes getting "tripped up" when walking.  Does not happen often, but pt is aware.  May have fallen once due to this.  Pt notes "feeling" his age as having more aches and pains.  Social History   Socioeconomic History  . Marital status: Married    Spouse name: Not on file  . Number of children: 1  . Years of education: Not on file  . Highest education level: Not on file  Occupational History  . Occupation: Retired  Scientific laboratory technician  . Financial resource strain: Not on file  . Food insecurity    Worry: Not on file    Inability: Not on file  . Transportation needs    Medical: Not on file    Non-medical: Not on file  Tobacco Use  . Smoking status: Former Smoker    Quit date: 08/04/1993    Years since quitting: 25.7  . Smokeless tobacco: Never Used  Substance and Sexual Activity  . Alcohol use: No  . Drug use: No  . Sexual activity: Not on file  Lifestyle  . Physical activity    Days per week: Not on file    Minutes per session: Not on file  . Stress: Not on file  Relationships  . Social Herbalist on phone: Not on file    Gets together: Not on file    Attends religious service: Not on file    Active member of club or organization: Not on file    Attends meetings of clubs or organizations: Not on file    Relationship status: Not on file  . Intimate partner violence   Fear of current or ex partner: Not on file    Emotionally abused: Not on file    Physically abused: Not on file    Forced sexual activity: Not on file  Other Topics Concern  . Not on file  Social History Narrative  . Not on file   Health Maintenance  Topic Date Due  . Hepatitis C Screening  Dec 30, 1945  . PNA vac Low Risk Adult (1 of 2 - PCV13) 03/24/2012  . COLONOSCOPY  08/09/2024  . TETANUS/TDAP  05/04/2028  . INFLUENZA VACCINE  Completed    The following portions of the patient's history were reviewed and updated as appropriate: allergies, current medications, past family history, past medical history, past social history, past surgical history and problem list.  Review of Systems Pertinent items noted in HPI and remainder of comprehensive ROS otherwise negative.   Objective:    BP 124/68 (BP Location: Left Arm, Patient Position: Sitting, Cuff Size: Normal)   Pulse 80   Temp (!) 97.3 F (36.3 C)   Wt 167 lb (75.8 kg)   SpO2 97%   BMI 26.35  kg/m  General appearance: alert, cooperative and no distress Head: Normocephalic, without obvious abnormality, atraumatic Eyes: conjunctivae/corneas clear. PERRL, EOM's intact. Fundi benign. Ears: normal TM's and external ear canals both ears Nose: Nares normal. Septum midline. Mucosa normal. No drainage or sinus tenderness. Throat: lips, mucosa, and tongue normal; teeth and gums normal Neck: no adenopathy, no carotid bruit, no JVD, supple, symmetrical, trachea midline and thyroid not enlarged, symmetric, no tenderness/mass/nodules Lungs: clear to auscultation bilaterally Heart: regular rate and rhythm, S1, S2 normal, no murmur, click, rub or gallop Abdomen: soft, non-tender; bowel sounds normal; no masses,  no organomegaly Extremities: extremities normal, atraumatic, no cyanosis or edema Pulses: 2+ and symmetric Skin: warm, dry, intact.  atopic dermatitis of R upper chest with mild erythema and excoriation  MSK:  R clavicle  prominent at sternoclavicular joint compared to L clavicle. Lymph nodes: Cervical, supraclavicular, and axillary nodes normal. Neurologic: Alert and oriented X 3, normal strength and tone. Normal symmetric reflexes. Normal coordination and gait    Assessment:    Healthy male exam.     Plan:     Anticipatory guidance given including wearing seatbelts, smoke detectors in the home, increasing physical activity, increasing p.o. intake of water and vegetables. -will obtain labs -given handout -next CPE in 1 yr See After Visit Summary for Counseling Recommendations    Epistaxis  -consider using [place] nasal gel -advised on proper way to stop bleeding -given handout - Plan: CBC with Differential/Platelet, Comprehensive metabolic panel  Irritable bowel syndrome with diarrhea  -consider low FODMAP diet and probiotic - Plan: dicyclomine (BENTYL) 20 MG tablet -given handout -consider f/u with GI  Dermatitis  - Plan: triamcinolone (KENALOG) 0.025 % cream, TSH, T4, Free  Mixed hyperlipidemia  - Plan: Lipid panel  F/u in 1 month for continued symptoms  Grier Mitts, MD

## 2019-04-14 NOTE — Patient Instructions (Signed)
Preventive Care 75 Years and Older, Male Preventive care refers to lifestyle choices and visits with your health care provider that can promote health and wellness. This includes:  A yearly physical exam. This is also called an annual well check.  Regular dental and eye exams.  Immunizations.  Screening for certain conditions.  Healthy lifestyle choices, such as diet and exercise. What can I expect for my preventive care visit? Physical exam Your health care provider will check:  Height and weight. These may be used to calculate body mass index (BMI), which is a measurement that tells if you are at a healthy weight.  Heart rate and blood pressure.  Your skin for abnormal spots. Counseling Your health care provider may ask you questions about:  Alcohol, tobacco, and drug use.  Emotional well-being.  Home and relationship well-being.  Sexual activity.  Eating habits.  History of falls.  Memory and ability to understand (cognition).  Work and work Statistician. What immunizations do I need?  Influenza (flu) vaccine  This is recommended every year. Tetanus, diphtheria, and pertussis (Tdap) vaccine  You may need a Td booster every 10 years. Varicella (chickenpox) vaccine  You may need this vaccine if you have not already been vaccinated. Zoster (shingles) vaccine  You may need this after age 50. Pneumococcal conjugate (PCV13) vaccine  One dose is recommended after age 24. Pneumococcal polysaccharide (PPSV23) vaccine  One dose is recommended after age 33. Measles, mumps, and rubella (MMR) vaccine  You may need at least one dose of MMR if you were born in 1957 or later. You may also need a second dose. Meningococcal conjugate (MenACWY) vaccine  You may need this if you have certain conditions. Hepatitis A vaccine  You may need this if you have certain conditions or if you travel or work in places where you may be exposed to hepatitis A. Hepatitis B vaccine   You may need this if you have certain conditions or if you travel or work in places where you may be exposed to hepatitis B. Haemophilus influenzae type b (Hib) vaccine  You may need this if you have certain conditions. You may receive vaccines as individual doses or as more than one vaccine together in one shot (combination vaccines). Talk with your health care provider about the risks and benefits of combination vaccines. What tests do I need? Blood tests  Lipid and cholesterol levels. These may be checked every 5 years, or more frequently depending on your overall health.  Hepatitis C test.  Hepatitis B test. Screening  Lung cancer screening. You may have this screening every year starting at age 74 if you have a 30-pack-year history of smoking and currently smoke or have quit within the past 15 years.  Colorectal cancer screening. All adults should have this screening starting at age 57 and continuing until age 54. Your health care provider may recommend screening at age 47 if you are at increased risk. You will have tests every 1-10 years, depending on your results and the type of screening test.  Prostate cancer screening. Recommendations will vary depending on your family history and other risks.  Diabetes screening. This is done by checking your blood sugar (glucose) after you have not eaten for a while (fasting). You may have this done every 1-3 years.  Abdominal aortic aneurysm (AAA) screening. You may need this if you are a current or former smoker.  Sexually transmitted disease (STD) testing. Follow these instructions at home: Eating and drinking  Eat  a diet that includes fresh fruits and vegetables, whole grains, lean protein, and low-fat dairy products. Limit your intake of foods with high amounts of sugar, saturated fats, and salt.  Take vitamin and mineral supplements as recommended by your health care provider.  Do not drink alcohol if your health care provider  tells you not to drink.  If you drink alcohol: ? Limit how much you have to 0-2 drinks a day. ? Be aware of how much alcohol is in your drink. In the [place] one drink equals one 12 oz bottle of beer (355 mL), one 5 oz glass of wine (148 mL), or one 1 oz glass of hard liquor (44 mL). Lifestyle  Take daily care of your teeth and gums.  Stay active. Exercise for at least 30 minutes on 5 or more days each week.  Do not use any products that contain nicotine or tobacco, such as cigarettes, e-cigarettes, and chewing tobacco. If you need help quitting, ask your health care provider.  If you are sexually active, practice safe sex. Use a condom or other form of protection to prevent STIs (sexually transmitted infections).  Talk with your health care provider about taking a low-dose aspirin or statin. What's next?  Visit your health care provider once a year for a well check visit.  Ask your health care provider how often you should have your eyes and teeth checked.  Stay up to date on all vaccines. This information is not intended to replace advice given to you by your health care provider. Make sure you discuss any questions you have with your health care provider. Document Released: 08/17/2015 Document Revised: 07/15/2018 Document Reviewed: 07/15/2018 Elsevier Patient Education  2020 [place], Adult A nosebleed is when blood comes out of the nose. Nosebleeds are common. Usually, they are not a sign of a serious condition. Nosebleeds can happen if a small blood vessel in your nose starts to bleed or if the lining of your nose (mucous membrane) cracks. They are commonly caused by:  Allergies.  Colds.  Picking your nose.  Blowing your nose too hard.  An injury from sticking an object into your nose or getting hit in the nose.  Dry or cold air. Less common causes of nosebleeds include:  Toxic fumes.  Something abnormal in the nose or in the air-filled spaces in the  bones of the face (sinuses).  Growths in the nose, such as polyps.  Medicines or conditions that cause blood to clot slowly.  Certain illnesses or procedures that irritate or dry out the nasal passages. Follow these instructions at home: When you have a nosebleed:   Sit down and tilt your head slightly forward.  Use a clean towel or tissue to pinch your nostrils under the bony part of your nose. After 10 minutes, let go of your nose and see if bleeding starts again. Do not release pressure before that time. If there is still bleeding, repeat the pinching and holding for 10 minutes until the bleeding stops.  Do not place tissues or gauze in the nose to stop bleeding.  Avoid lying down and avoid tilting your head backward. That may make blood collect in the throat and cause gagging or coughing.  Use a nasal spray decongestant to help with a nosebleed as told by your health care provider.  Do not use petroleum jelly or mineral oil in your nose. It can drip into your lungs. After a nosebleed:  Avoid blowing your nose or  sniffing for a number of hours.  Avoid straining, lifting, or bending at the waist for several days. You may resume other normal activities as you are able.  Use saline spray or a humidifier as told by your health care provider.  Aspirinand blood thinners make bleeding more likely. If you are prescribed these medicines and you suffer from nosebleeds: ? Ask your health care provider if you should stop taking the medicines or if you should adjust the dose. ? Do not stop taking medicines that your health care provider has recommended unless told by your health care provider.  If your nosebleed was caused by dry mucous membranes, use over-the-counter saline nasal spray or gel. This will keep the mucous membranes moist and allow them to heal. If you must use a lubricant: ? Choose one that is water-soluble. ? Use only as much as you need and use it only as often as needed.  ? Do not lie down until several hours after you use it. Contact a health care provider if:  You have a fever.  You get nosebleeds often or more often than usual.  You bruise very easily.  You have a nosebleed from having something stuck in your nose.  You have bleeding in your mouth.  You vomit or cough up brown material.  You have a nosebleed after you start a new medicine. Get help right away if:  You have a nosebleed after a fall or a head injury.  Your nosebleed does not go away after 20 minutes.  You feel dizzy or weak.  You have unusual bleeding from other parts of your body.  You have unusual bruising on other parts of your body.  You become sweaty.  You vomit blood. This information is not intended to replace advice given to you by your health care provider. Make sure you discuss any questions you have with your health care provider. Document Released: 04/30/2005 Document Revised: 10/20/2017 Document Reviewed: 02/05/2016 Elsevier Patient Education  [place]  FODMAPs (fermentable oligosaccharides, disaccharides, monosaccharides, and polyols) are sugars that are hard for some people to digest. A low-FODMAP eating plan may help some people who have bowel (intestinal) diseases to manage their symptoms. This meal plan can be complicated to follow. Work with a diet and nutrition specialist (dietitian) to make a low-FODMAP eating plan that is right for you. A dietitian can make sure that you get enough nutrition from this diet. What are tips for following this plan? Reading food labels  Check labels for hidden FODMAPs such as: ? High-fructose syrup. ? Honey. ? Agave. ? Natural fruit flavors. ? Onion or garlic powder.  Choose low-FODMAP foods that contain 3-4 grams of fiber per serving.  Check food labels for serving sizes. Eat only one serving at a time to make sure FODMAP levels stay low. Meal planning  Follow a low-FODMAP  eating plan for up to 6 weeks, or as told by your health care provider or dietitian.  To follow the eating plan: 1. Eliminate high-FODMAP foods from your diet completely. 2. Gradually reintroduce high-FODMAP foods into your diet one at a time. Most people should wait a few days after introducing one high-FODMAP food before they introduce the next high-FODMAP food. Your dietitian can recommend how quickly you may reintroduce foods. 3. Keep a daily record of what you eat and drink, and make note of any symptoms that you have after eating. 4. Review your daily record with a dietitian regularly. Your  dietitian can help you identify which foods you can eat and which foods you should avoid. General tips  Drink enough fluid each day to keep your urine pale yellow.  Avoid processed foods. These often have added sugar and may be high in FODMAPs.  Avoid most dairy products, whole grains, and sweeteners.  Work with a dietitian to make sure you get enough fiber in your diet. Recommended foods Grains  Gluten-free grains, such as rice, oats, buckwheat, quinoa, corn, polenta, and millet. Gluten-free pasta, bread, or cereal. Rice noodles. Corn tortillas. Vegetables  Eggplant, zucchini, cucumber, peppers, green beans, Brussels sprouts, bean sprouts, lettuce, arugula, kale, Swiss chard, spinach, collard greens, bok choy, summer squash, potato, and tomato. Limited amounts of corn, carrot, and sweet potato. Green parts of scallions. Fruits  Bananas, oranges, lemons, limes, blueberries, raspberries, strawberries, grapes, cantaloupe, honeydew melon, kiwi, papaya, passion fruit, and pineapple. Limited amounts of dried cranberries, banana chips, and shredded coconut. Dairy  Lactose-free milk, yogurt, and kefir. Lactose-free cottage cheese and ice cream. Non-dairy milks, such as almond, coconut, hemp, and rice milk. Yogurts made of non-dairy milks. Limited amounts of goat cheese, brie, mozzarella, parmesan,  swiss, and other hard cheeses. Meats and other protein foods  Unseasoned beef, pork, poultry, or fish. Eggs. Berniece Salines. Tofu (firm) and tempeh. Limited amounts of nuts and seeds, such as almonds, walnuts, [place] nuts, pecans, peanuts, pumpkin seeds, chia seeds, and sunflower seeds. Fats and oils  Butter-free spreads. Vegetable oils, such as olive, canola, and sunflower oil. Seasoning and other foods  Artificial sweeteners with names that do not end in "ol" such as aspartame, saccharine, and stevia. Maple syrup, white table sugar, raw sugar, brown sugar, and molasses. Fresh basil, coriander, parsley, rosemary, and thyme. Beverages  Water and mineral water. Sugar-sweetened soft drinks. Small amounts of orange juice or cranberry juice. Black and green tea. Most dry wines. Coffee. This may not be a complete list of low-FODMAP foods. Talk with your dietitian for more information. Foods to avoid Grains  Wheat, including kamut, durum, and semolina. Barley and bulgur. Couscous. Wheat-based cereals. Wheat noodles, bread, crackers, and pastries. Vegetables  Chicory root, artichoke, asparagus, cabbage, snow peas, sugar snap peas, mushrooms, and cauliflower. Onions, garlic, leeks, and the white part of scallions. Fruits  Fresh, dried, and juiced forms of apple, pear, watermelon, peach, plum, cherries, apricots, blackberries, boysenberries, figs, nectarines, and mango. Avocado. Dairy  Milk, yogurt, ice cream, and soft cheese. Cream and sour cream. Milk-based sauces. Custard. Meats and other protein foods  Fried or fatty meat. Sausage. Cashews and pistachios. Soybeans, baked beans, black beans, chickpeas, kidney beans, fava beans, navy beans, lentils, and split peas. Seasoning and other foods  Any sugar-free gum or candy. Foods that contain artificial sweeteners such as sorbitol, mannitol, isomalt, or xylitol. Foods that contain honey, high-fructose corn syrup, or agave. Bouillon, vegetable stock, beef  stock, and chicken stock. Garlic and onion powder. Condiments made with onion, such as hummus, chutney, pickles, relish, salad dressing, and salsa. Tomato paste. Beverages  Chicory-based drinks. Coffee substitutes. Chamomile tea. Fennel tea. Sweet or fortified wines such as port or sherry. Diet soft drinks made with isomalt, mannitol, maltitol, sorbitol, or xylitol. Apple, pear, and mango juice. Juices with high-fructose corn syrup. This may not be a complete list of high-FODMAP foods. Talk with your dietitian to discuss what dietary choices are best for you.  Summary  A low-FODMAP eating plan is a short-term diet that eliminates FODMAPs from your diet to help ease symptoms of certain  bowel diseases.  The eating plan usually lasts up to 6 weeks. After that, high-FODMAP foods are restarted gradually, one at a time, so you can find out which may be causing symptoms.  A low-FODMAP eating plan can be complicated. It is best to work with a dietitian who has experience with this type of plan. This information is not intended to replace advice given to you by your health care provider. Make sure you discuss any questions you have with your health care provider. Document Released: 03/17/2017 Document Revised: 07/03/2017 Document Reviewed: 03/17/2017 Elsevier Patient Education  2020 [place] for Irritable Bowel Syndrome When you have irritable bowel syndrome (IBS), it is very important to eat the foods and follow the eating habits that are best for your condition. IBS may cause various symptoms such as pain in the abdomen, constipation, or diarrhea. Choosing the right foods can help to ease the discomfort from these symptoms. Work with your health care provider and diet and nutrition specialist (dietitian) to find the eating plan that will help to control your symptoms. What are tips for following this plan?      Keep a food diary. This will help you identify foods that cause symptoms.  Write down: ? What you eat and when you eat it. ? What symptoms you have. ? When symptoms occur in relation to your meals, such as "pain in abdomen 2 hours after dinner."  Eat your meals slowly and in a relaxed setting.  Aim to eat 5-6 small meals per day. Do not skip meals.  Drink enough fluid to keep your urine pale yellow.  Ask your health care provider if you should take an over-the-counter probiotic to help restore healthy bacteria in your gut (digestive tract). ? Probiotics are foods that contain good bacteria and yeasts.  Your dietitian may have specific dietary recommendations for you based on your symptoms. He or she may recommend that you: ? Avoid foods that cause symptoms. Talk with your dietitian about other ways to get the same nutrients that are in those problem foods. ? Avoid foods with gluten. Gluten is a protein that is found in rye, wheat, and barley. ? Eat more foods that contain soluble fiber. Examples of foods with high soluble fiber include oats, seeds, and certain fruits and vegetables. Take a fiber supplement if directed by your dietitian. ? Reduce or avoid certain foods called FODMAPs. These are foods that contain carbohydrates that are hard to digest. Ask your doctor which foods contain these carbohydrates. What foods are not recommended? The following are some foods and drinks that may make your symptoms worse:  Fatty foods, such as french fries.  Foods that contain gluten, such as pasta and cereal.  Dairy products, such as milk, cheese, and ice cream.  Chocolate.  Alcohol.  Products with caffeine, such as coffee.  Carbonated drinks, such as soda.  Foods that are high in FODMAPs. These include certain fruits and vegetables.  Products with sweeteners such as honey, high fructose corn syrup, sorbitol, and mannitol. The items listed above may not be a complete list of foods and beverages you should avoid. Contact a dietitian for more information. What  foods are good sources of fiber? Your health care provider or dietitian may recommend that you eat more foods that contain fiber. Fiber can help to reduce constipation and other IBS symptoms. Add foods with fiber to your diet a little at a time so your body can get used to them. Too much  fiber at one time might cause gas and swelling of your abdomen. The following are some foods that are good sources of fiber:  Berries, such as raspberries, strawberries, and blueberries.  Tomatoes.  Carrots.  Brown rice.  Oats.  Seeds, such as chia and pumpkin seeds. The items listed above may not be a complete list of recommended sources of fiber. Contact your dietitian for more options. Where to find more information  International Foundation for Functional Gastrointestinal Disorders: www.iffgd.[employer] of Diabetes and Digestive and Kidney Diseases: [employer] Summary  When you have irritable bowel syndrome (IBS), it is very important to eat the foods and follow the eating habits that are best for your condition.  IBS may cause various symptoms such as pain in the abdomen, constipation, or diarrhea.  Choosing the right foods can help to ease the discomfort that comes from symptoms.  Keep a food diary. This will help you identify foods that cause symptoms.  Your health care provider or diet and nutrition specialist (dietitian) may recommend that you eat more foods that contain fiber. This information is not intended to replace advice given to you by your health care provider. Make sure you discuss any questions you have with your health care provider. Document Released: 10/11/2003 Document Revised: 11/10/2018 Document Reviewed: 03/24/2017 Elsevier Patient Education  2020 [employer].

## 2019-04-15 ENCOUNTER — Encounter: Payer: Self-pay | Admitting: Family Medicine

## 2019-04-15 DIAGNOSIS — N183 Chronic kidney disease, stage 3 unspecified: Secondary | ICD-10-CM | POA: Insufficient documentation

## 2019-04-22 ENCOUNTER — Other Ambulatory Visit: Payer: Self-pay | Admitting: Family Medicine

## 2019-04-22 NOTE — Telephone Encounter (Signed)
Copied from [place] [id number]. Topic: Quick Communication - Rx Refill/Question >> Apr 22, 2019  8:56 AM Rainey Pines A wrote: Medication:citalopram (CELEXA) 20 MG tablet (Patient stated that pharmacy has tried reaching out to get medication signed off on but havent gotten a response.)  Has the patient contacted their pharmacy? Yes (Agent: If no, request that the patient contact the pharmacy for the refill.) (Agent: If yes, when and what did the pharmacy advise?)Contact PCP  Preferred Pharmacy (with phone number or street name): [facility] [place], [address] AT [phone] (Phone) [phone] (Fax)    Agent: Please be advised that RX refills may take up to 3 business days. We ask that you follow-up with your pharmacy.

## 2019-04-22 NOTE — Telephone Encounter (Signed)
Requested medication (s) are due for refill today: yes  Requested medication (s) are on the active medication list: yes   Future visit scheduled: no  Notes to clinic:  Review for refill   Requested Prescriptions  Pending Prescriptions Disp Refills   citalopram (CELEXA) 20 MG tablet 90 tablet 0    Sig: Take 1 tablet (20 mg total) by mouth daily.     Psychiatry:  Antidepressants - SSRI Failed - 04/22/2019  9:03 AM      Failed - Completed PHQ-2 or PHQ-9 in the last 360 days.      Passed - Valid encounter within last 6 months    Recent Outpatient Visits          1 week ago Well adult exam   [facility] [place], [place]   11 months ago Irritable bowel syndrome with diarrhea   [facility] [place], [place]   1 year ago Bilateral carotid bruits   [place] [facility], Langley Adie, MD   1 year ago Hypertriglyceridemia   [place] [facility], Langley Adie, MD   1 year ago Mixed hyperlipidemia   [place] [facility], Langley Adie, MD

## 2019-04-23 ENCOUNTER — Other Ambulatory Visit: Payer: Self-pay | Admitting: Internal Medicine

## 2019-04-25 NOTE — Telephone Encounter (Signed)
Pt LOV was on 04/14/2019 and last refill was done by historical provider on 01/25/2012, please advise if ok to send a new Rx

## 2019-04-25 NOTE — Telephone Encounter (Signed)
Pts wife called stating she is concerned as pt has been off this medication for 4 days. Please advise.   Pts wife is also requesting a refill on dicyclomine (BENTYL) 20 MG tablet. Please advise.

## 2019-04-26 ENCOUNTER — Encounter: Payer: Self-pay | Admitting: Family Medicine

## 2019-04-26 NOTE — Telephone Encounter (Signed)
Spoke with pt wife verbalized understanding that Rx will be sent to pharmacy in the morning when Dr Volanda Napoleon reports to the office.

## 2019-04-27 MED ORDER — CITALOPRAM HYDROBROMIDE 20 MG PO TABS: 20.0000 mg | ORAL_TABLET | Freq: Every day | ORAL | 3 refills | Status: DC

## 2019-04-28 ENCOUNTER — Encounter: Payer: Self-pay | Admitting: Family Medicine

## 2019-04-29 ENCOUNTER — Telehealth: Payer: Self-pay

## 2019-04-29 NOTE — Telephone Encounter (Signed)
Request sent to Dr Volanda Napoleon for advise

## 2019-05-04 ENCOUNTER — Encounter: Payer: Self-pay | Admitting: Family Medicine

## 2019-05-05 NOTE — Telephone Encounter (Signed)
Called pt left detailed message with Dr Volanda Napoleon recommendations regarding pt medication. A message was send to pt  MyChart portal

## 2019-05-05 NOTE — Telephone Encounter (Signed)
Pt can try OTC IBguard.  There are some other meds that are similar to ones pt tired in the past without results such as cholestryamine.  There is an antidepressant that may help some (amitriptyline).  Pt should also consider f/u with GI again.

## 2019-05-06 ENCOUNTER — Other Ambulatory Visit: Payer: Self-pay

## 2019-05-06 ENCOUNTER — Telehealth: Payer: Self-pay | Admitting: Family Medicine

## 2019-05-06 MED ORDER — TOPIRAMATE 200 MG PO TABS: 200.0000 mg | ORAL_TABLET | Freq: Every day | ORAL | 0 refills | Status: DC

## 2019-05-06 NOTE — Telephone Encounter (Signed)
Darryl from [facility] called stating they denied [place] for medication dicyclomine.    Call back [phone] option 4

## 2019-05-06 NOTE — Telephone Encounter (Signed)
This is not a [facility] patient.

## 2019-05-06 NOTE — Telephone Encounter (Signed)
Called pt left a voicemail with details of Dr Volanda Napoleon advise. Pt medication is waiting Prior Auth approval

## 2019-05-11 ENCOUNTER — Encounter: Payer: Self-pay | Admitting: Family Medicine

## 2019-05-11 NOTE — Telephone Encounter (Signed)
Noted  

## 2019-05-11 NOTE — Telephone Encounter (Signed)
Spoke with [employer] regarding pt approval for the Rx Bentyl, verified that the office had our office fax number wrong, States that they faxed over some paperwork but have not been received by our office. Advised to faxed pt medical record to [facility] for processing of Rx approval. Fax sent and confirmation received. Pt notified

## 2019-05-13 NOTE — Telephone Encounter (Signed)
Luis Villegas from [facility] Call back number [phone] Called back to confirm if Dr Volanda Napoleon she has dicussed and side affect with the member and states the benefit will outweigh he risk. Please advise

## 2019-05-16 NOTE — Telephone Encounter (Signed)
Given issues with insurance approval/cost of meds.  Pt advised to try OTC IB guard and f/u with GI again.

## 2019-05-16 NOTE — Telephone Encounter (Signed)
FYI: LVM for Abigail Butts with [employer] to return my call in the office regarding pt

## 2019-05-17 ENCOUNTER — Telehealth: Payer: Self-pay

## 2019-05-17 ENCOUNTER — Other Ambulatory Visit: Payer: Self-pay | Admitting: Family Medicine

## 2019-05-17 NOTE — Telephone Encounter (Signed)
Spoke to [place] with [employer] states that they needed to know if pt is aware of the risks or benefits involved in taking the Dicyclomine at his age and if the benefits outweighs the risks. Advise Abigail Butts that I will send a message to Dr Volanda Napoleon and will call back with Dr Volanda Napoleon recommendation.

## 2019-05-17 NOTE — Telephone Encounter (Signed)
Medication is on the [facility] list as caution is advised with use in older pts.  Pt was advised to try OTC IB guard and follow up with GI, however per chart it seems pt picked up this med 6 days ago and paid out of pocket.

## 2019-05-17 NOTE — Telephone Encounter (Signed)
Spoke with Abigail Butts from [employer] verbalized understanding that Dr Cain Saupe advised pt that he can use OTC IB Guard and to follow up with GI. But pt declined Dr Volanda Napoleon advise. Pt send a message stating that he had picked up the Rx 6 days ago   from his pharmacy while pending Prior Authorization approval of this medication.

## 2019-05-17 NOTE — Telephone Encounter (Signed)
Abigail Butts from [employer] called in with question for doctor about recent medicine for the patient . Please give call back   [id number]

## 2019-05-17 NOTE — Telephone Encounter (Signed)
Spoke with Abigail Butts from [place] verbalized understanding that Dr Cain Saupe advised pt that he can use OTC IB Guard and to follow up with GI. But pt declined Dr Volanda Napoleon advise.

## 2019-05-18 ENCOUNTER — Telehealth: Payer: Self-pay

## 2019-05-18 NOTE — Telephone Encounter (Signed)
Copied from [place] [id number]. Topic: General - Other >> May 18, 2019  9:26 AM Rainey Pines A wrote:  Luis Villegas would like a callback from [place] in regards to prior authoriation for Dicyclomine. Luis Villegas stated that she needs Dr. Volanda Napoleon to [facility] patient of risks associated with medication such as drowsiness,dizziness, and confusion in order to be approved. [facility] callback number is [phone]

## 2019-05-18 NOTE — Telephone Encounter (Signed)
Luis Villegas with [employer] calling and states that the medication denial for Dicyclomine is being upheld due to provider not discussing risks and possible side effects with the patient. Please advise.

## 2019-05-18 NOTE — Telephone Encounter (Signed)
LVM for Luis Villegas to return my call in the office

## 2019-05-20 DIAGNOSIS — Z961 Presence of intraocular lens: Secondary | ICD-10-CM | POA: Diagnosis not present

## 2019-05-20 DIAGNOSIS — H04123 Dry eye syndrome of bilateral lacrimal glands: Secondary | ICD-10-CM | POA: Diagnosis not present

## 2019-05-20 NOTE — Telephone Encounter (Signed)
This message was given to Abigail Butts with [place] who is processing pt approval for this medication.

## 2019-06-01 NOTE — Telephone Encounter (Signed)
Spoke with Luis Villegas with [employer] states that pt case was closed and nothing further needed

## 2019-06-08 ENCOUNTER — Other Ambulatory Visit: Payer: Self-pay

## 2019-06-08 MED ORDER — EZETIMIBE-SIMVASTATIN 10-40 MG PO TABS: 1.0000 | ORAL_TABLET | Freq: Every day | ORAL | 0 refills | Status: DC

## 2019-08-01 ENCOUNTER — Other Ambulatory Visit: Payer: Self-pay | Admitting: Family Medicine

## 2019-08-03 ENCOUNTER — Other Ambulatory Visit: Payer: Self-pay | Admitting: Family Medicine

## 2019-08-03 DIAGNOSIS — K580 Irritable bowel syndrome with diarrhea: Secondary | ICD-10-CM

## 2019-08-25 ENCOUNTER — Other Ambulatory Visit: Payer: Self-pay | Admitting: Family Medicine

## 2019-08-28 ENCOUNTER — Other Ambulatory Visit: Payer: Self-pay | Admitting: Family Medicine

## 2019-08-29 NOTE — Telephone Encounter (Signed)
Pt LOV was on 03/14/2019 and last refill was 04/14/2019, please advise if ok for refill

## 2019-08-30 ENCOUNTER — Other Ambulatory Visit: Payer: Self-pay | Admitting: Family Medicine

## 2019-08-31 NOTE — Telephone Encounter (Signed)
Pt LOV was 04/14/2019 and last refill was on 07/14/2019 for 9 tablets with 3 refills , please advise

## 2019-09-09 ENCOUNTER — Telehealth: Payer: Medicare Other | Admitting: Family Medicine

## 2019-09-18 ENCOUNTER — Ambulatory Visit: Payer: Medicare Other | Attending: Internal Medicine

## 2019-09-18 DIAGNOSIS — Z23 Encounter for immunization: Secondary | ICD-10-CM | POA: Insufficient documentation

## 2019-09-18 NOTE — Progress Notes (Signed)
   Covid-19 Vaccination Clinic  Name:  Luis Villegas    MRN: [id number] DOB: 05-27-1946  09/18/2019  Luis Villegas was observed post Covid-19 immunization for 15 minutes without incidence. He was provided with Vaccine Information Sheet and instruction to access the V-Safe system.   Luis Villegas was instructed to call 911 with any severe reactions post vaccine: Marland Kitchen Difficulty breathing  . Swelling of your face and throat  . A fast heartbeat  . A bad rash all over your body  . Dizziness and weakness    Immunizations Administered    Name Date Dose VIS Date Route   Pfizer COVID-19 Vaccine 09/18/2019 10:02 AM 0.3 mL 07/15/2019 Intramuscular   Manufacturer: [employer], [employer]   Lot: [id number]   [place]: [id number]

## 2019-10-02 ENCOUNTER — Other Ambulatory Visit: Payer: Self-pay | Admitting: Family Medicine

## 2019-10-02 DIAGNOSIS — Z86010 Personal history of colonic polyps: Secondary | ICD-10-CM

## 2019-10-03 ENCOUNTER — Encounter: Payer: Self-pay | Admitting: Gastroenterology

## 2019-10-04 DIAGNOSIS — C61 Malignant neoplasm of prostate: Secondary | ICD-10-CM | POA: Diagnosis not present

## 2019-10-04 LAB — PSA: PSA: 2.88

## 2019-10-07 NOTE — Telephone Encounter (Signed)
Encounter is complete 

## 2019-10-10 DIAGNOSIS — Z87442 Personal history of urinary calculi: Secondary | ICD-10-CM | POA: Diagnosis not present

## 2019-10-10 DIAGNOSIS — C61 Malignant neoplasm of prostate: Secondary | ICD-10-CM | POA: Diagnosis not present

## 2019-10-10 DIAGNOSIS — R3915 Urgency of urination: Secondary | ICD-10-CM | POA: Diagnosis not present

## 2019-10-10 DIAGNOSIS — Z8549 Personal history of malignant neoplasm of other male genital organs: Secondary | ICD-10-CM | POA: Diagnosis not present

## 2019-10-11 ENCOUNTER — Ambulatory Visit: Payer: Medicare Other | Attending: Internal Medicine

## 2019-10-11 DIAGNOSIS — Z23 Encounter for immunization: Secondary | ICD-10-CM | POA: Insufficient documentation

## 2019-10-11 NOTE — Progress Notes (Signed)
   Covid-19 Vaccination Clinic  Name:  Luis Villegas    MRN: [id number] DOB: 13-Jul-1946  10/11/2019  Luis Villegas was observed post Covid-19 immunization for 15 minutes without incident. He was provided with Vaccine Information Sheet and instruction to access the V-Safe system.   Luis Villegas was instructed to call 911 with any severe reactions post vaccine: Marland Kitchen Difficulty breathing  . Swelling of face and throat  . A fast heartbeat  . A bad rash all over body  . Dizziness and weakness   Immunizations Administered    Name Date Dose VIS Date Route   Pfizer COVID-19 Vaccine 10/11/2019 10:43 AM 0.3 mL 07/15/2019 Intramuscular   Manufacturer: [place]   Lot: [id number]   [place]: [id number]

## 2019-10-12 ENCOUNTER — Encounter: Payer: Self-pay | Admitting: Family Medicine

## 2019-10-17 ENCOUNTER — Other Ambulatory Visit: Payer: Self-pay

## 2019-10-17 ENCOUNTER — Other Ambulatory Visit (HOSPITAL_COMMUNITY): Payer: Self-pay | Admitting: Urology

## 2019-10-17 ENCOUNTER — Ambulatory Visit (HOSPITAL_COMMUNITY)
Admission: RE | Admit: 2019-10-17 | Discharge: 2019-10-17 | Disposition: A | Discharge status: Home / Self Care | Payer: Medicare Other | Source: Ambulatory Visit | Attending: Urology | Admitting: Urology

## 2019-10-17 DIAGNOSIS — M25552 Pain in left hip: Secondary | ICD-10-CM | POA: Diagnosis not present

## 2019-10-27 ENCOUNTER — Other Ambulatory Visit: Payer: Self-pay

## 2019-10-27 ENCOUNTER — Other Ambulatory Visit: Payer: Self-pay | Admitting: Family Medicine

## 2019-10-31 ENCOUNTER — Encounter: Payer: Self-pay | Admitting: Orthopedic Surgery

## 2019-10-31 ENCOUNTER — Ambulatory Visit (INDEPENDENT_AMBULATORY_CARE_PROVIDER_SITE_OTHER): Payer: Medicare Other | Admitting: Orthopedic Surgery

## 2019-10-31 ENCOUNTER — Other Ambulatory Visit: Payer: Self-pay

## 2019-10-31 DIAGNOSIS — M25552 Pain in left hip: Secondary | ICD-10-CM | POA: Diagnosis not present

## 2019-10-31 NOTE — Progress Notes (Signed)
Office Visit Note   Patient: Luis Villegas           Date of Birth: November 28, 1945           MRN: [id number] Visit Date: 10/31/2019 Requested by: Billie Ruddy, MD [place],  [place] [address] PCP: Billie Ruddy, MD  Subjective: Chief Complaint  Patient presents with  . Left Leg - Pain    HPI: Luis Villegas is a 74 year old patient with left hip pain.  Denies any history of injury.  Been going on for several months.  Mostly lateral pain which he cannot really touch but which is deep in the proximal femur region.  Has occasional groin pain but most of his pain is lateral.  Describes worse pain in the morning as well as with prolonged activity.  Does wake him from sleep at night most nights.  Has occasional low back pain denies any numbness and tingling.  Radiographs on the [facility] system demonstrate fairly minimal hip arthritis.  He does have a recent increase in PSA.  States that the pain never really goes away.  Did have a PET scan a year ago which was negative.  Hard for him to do yard work.              ROS: All systems reviewed are negative as they relate to the chief complaint within the history of present illness.  Patient denies  fevers or chills.   Assessment & Plan: Visit Diagnoses:  1. Pain in left hip     Plan: Impression is left hip pain unclear etiology with physical exam findings not really consistent with trochanteric bursitis.  No radicular symptoms.  Not much in the way with groin pain with internal extra rotation of the leg.  It is a pretty unusual gnawing type pain.  I think since has been going on for several months and he has had failure of conservative management including activity modification as well as over-the-counter medication such as Tylenol that we should proceed with MRI of that left hip.  He is having night pain as well.  Differential diagnosis would be trochanteric bursitis versus tendinitis versus radicular pain from the back versus  intrinsic problem in the hip not visible on plain radiographs.  Follow-Up Instructions: No follow-ups on file.   Orders:  Orders Placed This Encounter  Procedures  . MR Hip Left w/o contrast   No orders of the defined types were placed in this encounter.     Procedures: No procedures performed   Clinical Data: No additional findings.  Objective: Vital Signs: There were no vitals taken for this visit.  Physical Exam:   Constitutional: Patient appears well-developed HEENT:  Head: Normocephalic Eyes:EOM are normal Neck: Normal range of motion Cardiovascular: Normal rate Pulmonary/chest: Effort normal Neurologic: Patient is alert Skin: Skin is warm Psychiatric: Patient has normal mood and affect    Ortho Exam: Ortho exam demonstrates no Trendelenburg gait.  Leg lengths equal.  No nerve root tension signs.  Not much in the way of groin pain with internal extra rotation of either leg.  Localizes the pain to the trochanteric region.  Has very good abduction adduction and hip flexion strength bilaterally.  Not much pain with forward lateral bending.  I cannot really elicit any asymmetric tenderness to palpation left hip versus right.  Specialty Comments:  No specialty comments available.  Imaging: No results found.   PMFS History: Patient Active Problem List   Diagnosis Date Noted  .  Chronic kidney disease (CKD), stage III (moderate) 04/15/2019  . VENTRAL HERNIA 11/23/2008  . COLONIC POLYPS, HX OF 11/23/2008  . HYPERLIPIDEMIA 11/11/2007  . CARPAL TUNNEL SYNDROME 11/11/2007  . GERD 11/11/2007  . SLEEP APNEA 11/11/2007  . PROSTATE CANCER, HX OF 11/11/2007  . ANXIETY STATE NOS 05/18/2007  . MIGRAINE NEC W/INTRACTABLE MIGRAINE 03/23/2007  . ANEMIA-NOS 09/23/2006   Past Medical History:  Diagnosis Date  . Colon polyps 2016  . GERD (gastroesophageal reflux disease)   . Heart murmur   . Hyperlipidemia   . IBS (irritable bowel syndrome)   . Kidney stones   .  Prostate cancer (Luis Villegas)   . Sleep apnea with use of continuous positive airway pressure (CPAP)     Family History  Problem Relation Age of Onset  . Stroke Father 42  . Bladder Cancer Father   . Breast cancer Mother   . Diabetes Mother   . Neuropathy Mother   . Valvular heart disease Mother   . Heart attack Mother 41  . Prostate cancer Brother   . Heart disease Brother   . Heart attack Brother   . Prostate cancer Brother     Past Surgical History:  Procedure Laterality Date  . CARPAL TUNNEL RELEASE Bilateral   . CERVICAL FUSION    . COLONOSCOPY  2008  . COLONOSCOPY W/ POLYPECTOMY  2002  . PROSTATECTOMY  2003   Dr.Davis  . TONSILLECTOMY     Social History   Occupational History  . Occupation: Retired  Tobacco Use  . Smoking status: Former Smoker    Quit date: 08/04/1993    Years since quitting: 26.2  . Smokeless tobacco: Never Used  Substance and Sexual Activity  . Alcohol use: No  . Drug use: No  . Sexual activity: Not on file

## 2019-11-16 ENCOUNTER — Other Ambulatory Visit: Payer: Self-pay

## 2019-11-16 ENCOUNTER — Ambulatory Visit
Admission: RE | Admit: 2019-11-16 | Discharge: 2019-11-16 | Disposition: A | Discharge status: Home / Self Care | Payer: Medicare Other | Source: Ambulatory Visit | Attending: Orthopedic Surgery | Admitting: Orthopedic Surgery

## 2019-11-16 DIAGNOSIS — S73192A Other sprain of left hip, initial encounter: Secondary | ICD-10-CM | POA: Diagnosis not present

## 2019-11-16 DIAGNOSIS — M25552 Pain in left hip: Secondary | ICD-10-CM

## 2019-11-21 ENCOUNTER — Encounter: Payer: Self-pay | Admitting: Orthopedic Surgery

## 2019-11-21 ENCOUNTER — Other Ambulatory Visit: Payer: Self-pay

## 2019-11-21 ENCOUNTER — Ambulatory Visit (INDEPENDENT_AMBULATORY_CARE_PROVIDER_SITE_OTHER): Payer: Medicare Other | Admitting: Orthopedic Surgery

## 2019-11-21 DIAGNOSIS — M7062 Trochanteric bursitis, left hip: Secondary | ICD-10-CM | POA: Diagnosis not present

## 2019-11-21 MED ORDER — BUPIVACAINE HCL 0.25 % IJ SOLN
4.0000 mL | INTRAMUSCULAR | Status: AC | PRN
  Administered 2019-11-21: 23:00:00 4 mL via INTRA_ARTICULAR

## 2019-11-21 MED ORDER — LIDOCAINE HCL 1 % IJ SOLN
5.0000 mL | INTRAMUSCULAR | Status: AC | PRN
  Administered 2019-11-21: 5 mL

## 2019-11-21 MED ORDER — METHYLPREDNISOLONE ACETATE 80 MG/ML IJ SUSP
80.0000 mg | INTRAMUSCULAR | Status: AC | PRN
  Administered 2019-11-21: 23:00:00 80 mg via INTRA_ARTICULAR

## 2019-11-21 NOTE — Progress Notes (Signed)
Office Visit Note   Patient: Luis Villegas           Date of Birth: Jul 22, 1946           MRN: [id number] Visit Date: 11/21/2019 Requested by: Billie Ruddy, MD [place],  [place] [address] PCP: Billie Ruddy, MD  Subjective: Chief Complaint  Patient presents with  . Follow-up    HPI: Luis Villegas is a patient with left hip pain.  Since have seen him he has had an MRI scan which is reviewed.  Reports pain primarily in the lateral aspect of the hip and less so in the groin.  Denies any mechanical symptoms.  MRI scan is reviewed and has essentially gluteus minimus tendinosis which is the most significant finding.  No arthritis in the hip.  Some SI joint arthritis is present but that is not in the region of his maximal tenderness.  He also has questionable labral tear which is extremely underwhelming on my review of the MRI scan.  Hard for him to work out in the yard or play golf.  When he does anything active he "suffers later".  Takes Tylenol for his symptoms.  Denies any radicular symptoms.              ROS: All systems reviewed are negative as they relate to the chief complaint within the history of present illness.  Patient denies  fevers or chills.   Assessment & Plan: Visit Diagnoses:  1. Trochanteric bursitis, left hip     Plan: Impression is left hip pain.  Does have some gluteus medius tendinosis.  No hip arthritis.  We discussed therapy versus injection.  Plan today for ultrasound-guided injection around the greater trochanter.  Were not actually injecting the tendon so I doubt this was going to precipitate any type of tendon rupture.  We will try this injection first and then if it is not successful I would favor physical therapy at that time.  We discussed reversing the order on that but he was more amenable to injection today per his preference as opposed to therapy first and injection.  Follow-Up Instructions: No follow-ups on file.   Orders:    No orders of the defined types were placed in this encounter.  No orders of the defined types were placed in this encounter.     Procedures: Large Joint Inj: L greater trochanter on 11/21/2019 10:30 PM Indications: pain and diagnostic evaluation Details: 18 G 3.5 in needle, ultrasound-guided lateral approach  Arthrogram: No  Medications: 5 mL lidocaine 1 %; 80 mg methylPREDNISolone acetate 80 MG/ML; 4 mL bupivacaine 0.25 % Outcome: tolerated well, no immediate complications Procedure, treatment alternatives, risks and benefits explained, specific risks discussed. Consent was given by the patient. Immediately prior to procedure a time out was called to verify the correct patient, procedure, equipment, support staff and site/side marked as required. Patient was prepped and draped in the usual sterile fashion.       Clinical Data: No additional findings.  Objective: Vital Signs: There were no vitals taken for this visit.  Physical Exam:   Constitutional: Patient appears well-developed HEENT:  Head: Normocephalic Eyes:EOM are normal Neck: Normal range of motion Cardiovascular: Normal rate Pulmonary/chest: Effort normal Neurologic: Patient is alert Skin: Skin is warm Psychiatric: Patient has normal mood and affect    Ortho Exam: Ortho exam demonstrates no Trendelenburg gait.  Does have some trochanteric tenderness on the left compared to the right.  No groin pain  with internal X rotation of the leg.  Good hip flexion abduction adduction strength.  He is able to stand on one leg both sides without difficulty.  No other masses lymphadenopathy or skin changes noted in that left leg region.  Specialty Comments:  No specialty comments available.  Imaging: No results found.   PMFS History: Patient Active Problem List   Diagnosis Date Noted  . Chronic kidney disease (CKD), stage III (moderate) 04/15/2019  . VENTRAL HERNIA 11/23/2008  . COLONIC POLYPS, HX OF 11/23/2008  .  HYPERLIPIDEMIA 11/11/2007  . CARPAL TUNNEL SYNDROME 11/11/2007  . GERD 11/11/2007  . SLEEP APNEA 11/11/2007  . PROSTATE CANCER, HX OF 11/11/2007  . ANXIETY STATE NOS 05/18/2007  . MIGRAINE NEC W/INTRACTABLE MIGRAINE 03/23/2007  . ANEMIA-NOS 09/23/2006   Past Medical History:  Diagnosis Date  . Colon polyps 2016  . GERD (gastroesophageal reflux disease)   . Heart murmur   . Hyperlipidemia   . IBS (irritable bowel syndrome)   . Kidney stones   . Prostate cancer ([place])   . Sleep apnea with use of continuous positive airway pressure (CPAP)     Family History  Problem Relation Age of Onset  . Stroke Father 73  . Bladder Cancer Father   . Breast cancer Mother   . Diabetes Mother   . Neuropathy Mother   . Valvular heart disease Mother   . Heart attack Mother 64  . Prostate cancer Brother   . Heart disease Brother   . Heart attack Brother   . Prostate cancer Brother     Past Surgical History:  Procedure Laterality Date  . CARPAL TUNNEL RELEASE Bilateral   . CERVICAL FUSION    . COLONOSCOPY  2008  . COLONOSCOPY W/ POLYPECTOMY  2002  . PROSTATECTOMY  2003   Dr.Davis  . TONSILLECTOMY     Social History   Occupational History  . Occupation: Retired  Tobacco Use  . Smoking status: Former Smoker    Quit date: 08/04/1993    Years since quitting: 26.3  . Smokeless tobacco: Never Used  Substance and Sexual Activity  . Alcohol use: No  . Drug use: No  . Sexual activity: Not on file

## 2019-11-23 ENCOUNTER — Encounter: Payer: Medicare Other | Admitting: Gastroenterology

## 2019-11-25 ENCOUNTER — Encounter: Payer: Self-pay | Admitting: Family Medicine

## 2019-11-25 ENCOUNTER — Other Ambulatory Visit: Payer: Self-pay | Admitting: Family Medicine

## 2019-11-25 DIAGNOSIS — B009 Herpesviral infection, unspecified: Secondary | ICD-10-CM | POA: Insufficient documentation

## 2019-11-25 NOTE — Telephone Encounter (Signed)
Pt LOV was 04/14/2019 and last refill was done on 08/25/2019 for 30 tablets, ok to refill or have pt schedule appointment

## 2019-11-26 ENCOUNTER — Other Ambulatory Visit: Payer: Self-pay | Admitting: Family Medicine

## 2019-11-26 ENCOUNTER — Telehealth: Payer: Medicare Other | Admitting: Nurse Practitioner

## 2019-11-26 DIAGNOSIS — R509 Fever, unspecified: Secondary | ICD-10-CM

## 2019-11-26 NOTE — Progress Notes (Signed)
Based on what you shared with me it looks like you have a fever and urinary incontinence,that should be evaluated in a face to face office visit. You need blood work as well as urinalysis and urine culture for proper treatment    NOTE: If you entered your credit card information for this eVisit, you will not be charged. You may see a "hold" on your card for the $35 but that hold will drop off and you will not have a charge processed.  If you are having a true medical emergency please call 911.     For an urgent face to face visit, [facility] has four urgent care centers for your convenience:   . [facility]    [phone]                  Get Driving Directions  [id number] [place], [place] [address] . 10 am to 8 pm Monday-Friday . 12 pm to 8 pm Saturday-Sunday   . [facility] [place]                  Get Driving Directions  [phone] [place], [place], [place] [address] . 8 am to 8 pm Monday-Friday . 9 am to 6 pm Saturday . 11 am to 6 pm Sunday   . [facility] at [place]                  Get Driving Directions   [address] [place], [place] [address] . 8 am to 8 pm Monday-Friday . 8 am to 4 pm Saturday-Sunday    . [facility] at [place]                    Get Driving Directions  [id number]  [address], [place], [place] [address]  . Monday-Friday, 12 PM to 6 PM    Your e-visit answers were reviewed by a board certified advanced clinical practitioner to complete your personal care plan.  Thank you for using e-Visits.

## 2019-12-07 ENCOUNTER — Ambulatory Visit (AMBULATORY_SURGERY_CENTER): Payer: Self-pay | Admitting: *Deleted

## 2019-12-07 ENCOUNTER — Other Ambulatory Visit: Payer: Self-pay

## 2019-12-07 VITALS — Temp 97.3°F | Ht 66.0 in | Wt 172.0 lb

## 2019-12-07 DIAGNOSIS — Z86010 Personal history of colonic polyps: Secondary | ICD-10-CM

## 2019-12-07 MED ORDER — SUTAB 1479-225-188 MG PO TABS: 1.0000 | ORAL_TABLET | ORAL | 0 refills | Status: DC

## 2019-12-07 NOTE — Progress Notes (Signed)
Patient is here in-person for PV. Patient denies any allergies to eggs or soy. Patient denies any problems with anesthesia/sedation. Patient denies any oxygen use at home. Patient denies taking any diet/weight loss medications or blood thinners. Patient is not being treated for MRSA or C-diff. Patient is aware of our care-partner policy and 0000000 safety protocol.  Completed vaccines 10/11/19. Prep Prescription coupon given to the patient.

## 2019-12-21 ENCOUNTER — Other Ambulatory Visit: Payer: Self-pay

## 2019-12-21 ENCOUNTER — Ambulatory Visit (AMBULATORY_SURGERY_CENTER): Payer: Medicare Other | Admitting: Gastroenterology

## 2019-12-21 ENCOUNTER — Encounter: Payer: Self-pay | Admitting: Gastroenterology

## 2019-12-21 VITALS — BP 108/64 | HR 72 | Temp 97.8°F | Resp 14 | Ht 66.0 in | Wt 172.0 lb

## 2019-12-21 DIAGNOSIS — R197 Diarrhea, unspecified: Secondary | ICD-10-CM

## 2019-12-21 DIAGNOSIS — D122 Benign neoplasm of ascending colon: Secondary | ICD-10-CM

## 2019-12-21 DIAGNOSIS — K635 Polyp of colon: Secondary | ICD-10-CM

## 2019-12-21 DIAGNOSIS — K529 Noninfective gastroenteritis and colitis, unspecified: Secondary | ICD-10-CM

## 2019-12-21 DIAGNOSIS — D123 Benign neoplasm of transverse colon: Secondary | ICD-10-CM | POA: Diagnosis not present

## 2019-12-21 DIAGNOSIS — D120 Benign neoplasm of cecum: Secondary | ICD-10-CM

## 2019-12-21 DIAGNOSIS — D125 Benign neoplasm of sigmoid colon: Secondary | ICD-10-CM | POA: Diagnosis not present

## 2019-12-21 DIAGNOSIS — Z86010 Personal history of colonic polyps: Secondary | ICD-10-CM | POA: Diagnosis not present

## 2019-12-21 MED ORDER — SODIUM CHLORIDE 0.9 % IV SOLN
500.0000 mL | Freq: Once | INTRAVENOUS | Status: DC
Start: 2019-12-21 — End: 2019-12-21

## 2019-12-21 NOTE — Progress Notes (Signed)
Vitals-CW Temp-JB  Pt's states no medical or surgical changes since previsit or office visit. 

## 2019-12-21 NOTE — Progress Notes (Signed)
pt tolerated well. VSS. awake and to recovery. Report given to RN.  

## 2019-12-21 NOTE — Patient Instructions (Signed)
Thank you for allowing [place] to to care for you today!  Await pathology results by mail, approximately 7-10 days.  Will make recommendation at that time for future colonoscopy.  Resume previous diet and medications today.  Return to your normal activities tomorrow.     YOU HAD AN ENDOSCOPIC PROCEDURE TODAY AT [facility]:   Refer to the procedure report that was given to you for any specific questions about what was found during the examination.  If the procedure report does not answer your questions, please call your gastroenterologist to clarify.  If you requested that your care partner not be given the details of your procedure findings, then the procedure report has been included in a sealed envelope for you to review at your convenience later.  YOU SHOULD EXPECT: Some feelings of bloating in the abdomen. Passage of more gas than usual.  Walking can help get rid of the air that was put into your GI tract during the procedure and reduce the bloating. If you had a lower endoscopy (such as a colonoscopy or flexible sigmoidoscopy) you may notice spotting of blood in your stool or on the toilet paper. If you underwent a bowel prep for your procedure, you may not have a normal bowel movement for a few days.  Please Note:  You might notice some irritation and congestion in your nose or some drainage.  This is from the oxygen used during your procedure.  There is no need for concern and it should clear up in a day or so.  SYMPTOMS TO REPORT IMMEDIATELY:   Following lower endoscopy (colonoscopy or flexible sigmoidoscopy):  Excessive amounts of blood in the stool  Significant tenderness or worsening of abdominal pains  Swelling of the abdomen that is new, acute  Fever of 100F or higher    For urgent or emergent issues, a gastroenterologist can be reached at any hour by calling [phone]. Do not use MyChart messaging for urgent concerns.    DIET:  We do recommend a small  meal at first, but then you may proceed to your regular diet.  Drink plenty of fluids but you should avoid alcoholic beverages for 24 hours.  ACTIVITY:  You should plan to take it easy for the rest of today and you should NOT DRIVE or use heavy machinery until tomorrow (because of the sedation medicines used during the test).    FOLLOW UP: Our staff will call the number listed on your records 48-72 hours following your procedure to check on you and address any questions or concerns that you may have regarding the information given to you following your procedure. If we do not reach you, we will leave a message.  We will attempt to reach you two times.  During this call, we will ask if you have developed any symptoms of COVID 19. If you develop any symptoms (ie: fever, flu-like symptoms, shortness of breath, cough etc.) before then, please call [phone].  If you test positive for Covid 19 in the 2 weeks post procedure, please call and report this information to [place].    If any biopsies were taken you will be contacted by phone or by letter within the next 1-3 weeks.  Please call us at [phone] if you have not heard about the biopsies in 3 weeks.    SIGNATURES/CONFIDENTIALITY: You and/or your care partner have signed paperwork which will be entered into your electronic medical record.  These signatures attest to the fact that  that the information above on your After Visit Summary has been reviewed and is understood.  Full responsibility of the confidentiality of this discharge information lies with you and/or your care-partner.

## 2019-12-21 NOTE — Progress Notes (Signed)
Called to room to assist during endoscopic procedure.  Patient ID and intended procedure confirmed with present staff. Received instructions for my participation in the procedure from the performing physician.  

## 2019-12-21 NOTE — Op Note (Addendum)
[facility] Patient Name: Luis Villegas Procedure Date: 12/21/2019 9:33 AM MRN: [id number] Endoscopist: Viviann Spare P. Adela Lank , MD Age: 74 Referring MD:  Date of Birth: 1946-05-01 Gender: Male Account #: [id number] Procedure:                Colonoscopy Indications:              High risk colon cancer surveillance: Personal                            history of colonic polyps, patient endorses chronic                            loose stools Medicines:                Monitored Anesthesia Care Procedure:                Pre-Anesthesia Assessment:                           - Prior to the procedure, a History and Physical                            was performed, and patient medications and                            allergies were reviewed. The patient's tolerance of                            previous anesthesia was also reviewed. The risks                            and benefits of the procedure and the sedation                            options and risks were discussed with the patient.                            All questions were answered, and informed consent                            was obtained. Prior Anticoagulants: The patient has                            taken no previous anticoagulant or antiplatelet                            agents. ASA Grade Assessment: II - A patient with                            mild systemic disease. After reviewing the risks                            and benefits, the patient was deemed in  satisfactory condition to undergo the procedure.                           After obtaining informed consent, the colonoscope                            was passed under direct vision. Throughout the                            procedure, the patient's blood pressure, pulse, and                            oxygen saturations were monitored continuously. The                            Colonoscope was introduced through the  anus and                            advanced to the the cecum, identified by                            appendiceal orifice and ileocecal valve. The                            colonoscopy was technically difficult and complex                            due to a tortuous colon. The patient tolerated the                            procedure well. The quality of the bowel                            preparation was good. The terminal ileum, ileocecal                            valve, appendiceal orifice, and rectum were                            photographed. Scope In: 9:42:30 AM Scope Out: 10:21:31 AM Scope Withdrawal Time: 0 hours 27 minutes 13 seconds  Total Procedure Duration: 0 hours 39 minutes 1 second  Findings:                 The perianal and digital rectal examinations were                            normal.                           The terminal ileum appeared normal.                           Two sessile polyps were found in the cecum. The  polyps were 3 mm in size. These polyps were removed                            with a cold snare. Resection and retrieval were                            complete.                           A diminutive polyp was found in the ileocecal                            valve. The polyp was flat. The polyp was removed                            with a cold biopsy forceps. Resection and retrieval                            were complete.                           Two sessile polyps were found in the ascending                            colon. The polyps were 4 to 8 mm in size. These                            polyps were removed with a cold snare. Resection                            and retrieval were complete.                           A 8 mm polyp was found in the hepatic flexure. The                            polyp was sessile. The polyp was removed with a                            cold snare. Resection and retrieval  were complete.                           Four sessile polyps were found in the transverse                            colon. The polyps were 3 to 8 mm in size. These                            polyps were removed with a cold snare. Resection                            and retrieval were complete.  A 3 mm polyp was found in the sigmoid colon. The                            polyp was sessile. The polyp was removed with a                            cold snare. Resection and retrieval were complete.                           The colon was extremely tortuous, multiple                            angulated turns in the sigmoid colon which was                            difficult to traverse.                           A few small angiodysplastic lesions were found in                            the very distal rectum into the dentate line                            consistent with mild radiation changes. The rectum                            was small, retroflexed views not obtained.                           The exam was otherwise without abnormality.                           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 Complications:            No immediate complications. Estimated blood loss:                            Minimal. Estimated Blood Loss:     Estimated blood loss was minimal. Impression:               - The examined portion of the ileum was normal.                           - Two 3 mm polyps in the cecum, removed with a cold                            snare. Resected and retrieved.                           -  One diminutive polyp at the ileocecal valve,                            removed with a cold biopsy forceps. Resected and                            retrieved.                           - Two 4 to 8 mm polyps in the  ascending colon,                            removed with a cold snare. Resected and retrieved.                           - One 8 mm polyp at the hepatic flexure, removed                            with a cold snare. Resected and retrieved.                           - Four 4 to 8 mm polyps in the transverse colon,                            removed with a cold snare. Resected and retrieved.                           - One 3 mm polyp in the sigmoid colon, removed with                            a cold snare. Resected and retrieved.                           - Tortuous colon.                           - Very mild distal radiation proctitis, small                            rectum.                           - The examination was otherwise normal.                           - Biopsies were taken with a cold forceps from the                            right colon and left colon for evaluation of                            microscopic colitis. Recommendation:           - Patient has a contact number available for  emergencies. The signs and symptoms of potential                            delayed complications were discussed with the                            patient. Return to normal activities tomorrow.                            Written discharge instructions were provided to the                            patient.                           - Resume previous diet.                           - Continue present medications.                           - Await pathology results. Viviann Spare P. Crimson Beer, MD 12/21/2019 10:34:49 AM This report has been signed electronically.

## 2019-12-23 ENCOUNTER — Telehealth: Payer: Self-pay | Admitting: *Deleted

## 2019-12-23 NOTE — Telephone Encounter (Signed)
  Follow up Call-  Call back number 12/21/2019  Post procedure Call Back phone  # [phone]  Permission to leave phone message Yes  Some recent data might be hidden     Patient questions:  Do you have a fever, pain , or abdominal swelling? No. Pain Score  0 *  Have you tolerated food without any problems? Yes.    Have you been able to return to your normal activities? Yes.    Do you have any questions about your discharge instructions: Diet   No. Medications  No. Follow up visit  No.  Do you have questions or concerns about your Care? No.  Actions: * If pain score is 4 or above: No action needed, pain <4.  1. Have you developed a fever since your procedure? no  2.   Have you had an respiratory symptoms (SOB or cough) since your procedure? no  3.   Have you tested positive for COVID 19 since your procedure no  4.   Have you had any family members/close contacts diagnosed with the COVID 19 since your procedure?  no   If yes to any of these questions please route to Joylene John, RN and Erenest Rasher, RN

## 2020-01-25 ENCOUNTER — Other Ambulatory Visit: Payer: Self-pay | Admitting: Family Medicine

## 2020-01-25 NOTE — Telephone Encounter (Signed)
Pt LOV was 04/14/2019 and last refill was on 10/27/2019 for 90 tablets, please advise if ok to send refill

## 2020-02-06 ENCOUNTER — Encounter: Payer: Self-pay | Admitting: Family Medicine

## 2020-02-10 ENCOUNTER — Encounter: Payer: Self-pay | Admitting: Family Medicine

## 2020-02-10 ENCOUNTER — Other Ambulatory Visit: Payer: Self-pay

## 2020-02-10 ENCOUNTER — Ambulatory Visit (INDEPENDENT_AMBULATORY_CARE_PROVIDER_SITE_OTHER): Payer: Medicare Other | Admitting: Family Medicine

## 2020-02-10 VITALS — BP 120/70 | HR 74 | Temp 97.5°F | Ht 66.0 in | Wt 171.5 lb

## 2020-02-10 DIAGNOSIS — H66001 Acute suppurative otitis media without spontaneous rupture of ear drum, right ear: Secondary | ICD-10-CM

## 2020-02-10 DIAGNOSIS — J302 Other seasonal allergic rhinitis: Secondary | ICD-10-CM | POA: Diagnosis not present

## 2020-02-10 MED ORDER — TOPIRAMATE 200 MG PO TABS: ORAL_TABLET | ORAL | 3 refills | Status: DC

## 2020-02-10 MED ORDER — BUPROPION HCL ER (XL) 150 MG PO TB24: 150.0000 mg | ORAL_TABLET | Freq: Every day | ORAL | 2 refills | Status: DC

## 2020-02-10 MED ORDER — EZETIMIBE-SIMVASTATIN 10-40 MG PO TABS: 1.0000 | ORAL_TABLET | Freq: Every day | ORAL | 3 refills | Status: DC

## 2020-02-10 MED ORDER — ACYCLOVIR 200 MG PO CAPS: 200.0000 mg | ORAL_CAPSULE | Freq: Every day | ORAL | 3 refills | Status: DC

## 2020-02-10 MED ORDER — CITALOPRAM HYDROBROMIDE 20 MG PO TABS: 20.0000 mg | ORAL_TABLET | Freq: Every day | ORAL | 3 refills | Status: DC

## 2020-02-10 MED ORDER — CIPROFLOXACIN-DEXAMETHASONE 0.3-0.1 % OT SUSP: 4.0000 [drp] | Freq: Two times a day (BID) | OTIC | 0 refills | Status: AC

## 2020-02-10 MED ORDER — SUMATRIPTAN SUCCINATE 100 MG PO TABS: ORAL_TABLET | ORAL | 6 refills | Status: DC

## 2020-02-10 NOTE — Progress Notes (Signed)
Subjective:    Patient ID: Luis Villegas, male    DOB: 08/21/1945, 74 y.o.   MRN: [id number]  Chief Complaint  Patient presents with   Ear Pain    right ear pain that started 2 weeks ago    HPI Patient was seen today for acute concern.  Patient endorses right ear pain x2 weeks and subsequent drainage.  Drainage noted as runny, slightly yellow.  Ear feels stuffy/clogged.  Also notes thickened nasal mucus.  At times so thick difficult to blow out.  Patient notes history of allergies and epistaxis when using Flonase or blowing nose.  Denies headache, fever, chills, nausea, vomiting, sore throat.  Patient recently started taking Vesicare 5 mg daily.  Past Medical History:  Diagnosis Date   Allergy    Colon polyps 2016   Depression    GERD (gastroesophageal reflux disease)    Heart murmur    Hyperlipidemia    IBS (irritable bowel syndrome)    Kidney stones    stage 3    Prostate cancer ([place]) 2003   sx 2003 and radation   Sleep apnea    no cpap per pt   Sleep apnea with use of continuous positive airway pressure (CPAP)     Allergies  Allergen Reactions   Lipitor [Atorvastatin Calcium]     myalgias    ROS General: Denies fever, chills, night sweats, changes in weight, changes in appetite HEENT: Denies headaches, ear pain, changes in vision, rhinorrhea, sore throat  + right ear pain and drainage CV: Denies CP, palpitations, SOB, orthopnea Pulm: Denies SOB, cough, wheezing GI: Denies abdominal pain, nausea, vomiting, diarrhea, constipation GU: Denies dysuria, hematuria, frequency, vaginal discharge Msk: Denies muscle cramps, joint pains Neuro: Denies weakness, numbness, tingling Skin: Denies rashes, bruising Psych: Denies depression, anxiety, hallucinations     Objective:    Blood pressure 120/70, pulse 74, temperature (!) 97.5 F (36.4 C), temperature source Temporal, height 5\' 6"  (1.676 m), weight 171 lb 8 oz (77.8 kg), SpO2 97 %.   Gen.  Pleasant, well-nourished, in no distress, normal affect   HEENT: [place]/AT, face symmetric, no scleral icterus, PERRLA, EOMI, nares patent without drainage, pharynx without erythema or exudate.  Left TM in canal normal.  Right TM with erythema on the left border, effusion and suppurative fluid in canal proximal to TM.  No TTP of maxillary, ethmoid or frontal sinuses. Lungs: no accessory muscle use Cardiovascular: RRR, no m/r/g, no peripheral edema Musculoskeletal: No deformities, no cyanosis or clubbing, normal tone Neuro:  A&Ox3, CN II-XII intact, normal gait Skin:  Warm, no lesions/ rash   Wt Readings from Last 3 Encounters:  02/10/20 171 lb 8 oz (77.8 kg)  12/21/19 172 lb (78 kg)  12/07/19 172 lb (78 kg)    Lab Results  Component Value Date   WBC 5.9 04/14/2019   HGB 12.9 (L) 04/14/2019   HCT 38.9 (L) 04/14/2019   PLT 243.0 04/14/2019   GLUCOSE 92 04/14/2019   CHOL 146 04/14/2019   TRIG 156.0 (H) 04/14/2019   HDL 43.60 04/14/2019   LDLCALC 71 04/14/2019   ALT 14 04/14/2019   AST 14 04/14/2019   NA 139 04/14/2019   K 3.9 04/14/2019   CL 107 04/14/2019   CREATININE 1.56 (H) 04/14/2019   BUN 20 04/14/2019   CO2 24 04/14/2019   TSH 0.65 04/14/2019   PSA 2.88 10/04/2019   HGBA1C 6.1 12/23/2017    Assessment/Plan:  Acute suppurative otitis media of right ear without spontaneous  rupture of tympanic membrane, recurrence not specified  -Tylenol as needed for any pain/discomfort -Given precautions for worsening symptoms -Nasal saline rinse suggested for sinus issues - Plan: ciprofloxacin-dexamethasone (CIPRODEX) OTIC suspension  Seasonal allergies -Discussed using saline nasal rinse  Regular medications refilled including acyclovir, Wellbutrin XL, Celexa, Vytorin, omeprazole, Imitrex, Topamax.  F/u as needed for worsening or continued symptoms  Grier Mitts, MD

## 2020-02-10 NOTE — Patient Instructions (Addendum)
Otitis Media, Adult  Otitis media occurs when there is inflammation and fluid in the middle ear. Your middle ear is a part of the ear that contains bones for hearing as well as air that helps send sounds to your brain. What are the causes? This condition is caused by a blockage in the eustachian tube. This tube drains fluid from the ear to the back of the nose (nasopharynx). A blockage in this tube can be caused by an object or by swelling (edema) in the tube. Problems that can cause a blockage include:  A cold or other upper respiratory infection.  Allergies.  An irritant, such as tobacco smoke.  Enlarged adenoids. The adenoids are areas of soft tissue located high in the back of the throat, behind the nose and the roof of the mouth.  A mass in the nasopharynx.  Damage to the ear caused by pressure changes (barotrauma). What are the signs or symptoms? Symptoms of this condition include:  Ear pain.  A fever.  Decreased hearing.  A headache.  Tiredness (lethargy).  Fluid leaking from the ear.  Ringing in the ear. How is this diagnosed? This condition is diagnosed with a physical exam. During the exam your health care provider will use an instrument called an otoscope to look into your ear and check for redness, swelling, and fluid. He or she will also ask about your symptoms. Your health care provider may also order tests, such as:  A test to check the movement of the eardrum (pneumatic otoscopy). This test is done by squeezing a small amount of air into the ear.  A test that changes air pressure in the middle ear to check how well the eardrum moves and whether the eustachian tube is working (tympanogram). How is this treated? This condition usually goes away on its own within 3-5 days. But if the condition is caused by a bacteria infection and does not go away own its own, or keeps coming back, your health care provider may:  Prescribe antibiotic medicines to treat the  infection.  Prescribe or recommend medicines to control pain. Follow these instructions at home:  Take over-the-counter and prescription medicines only as told by your health care provider.  If you were prescribed an antibiotic medicine, take it as told by your health care provider. Do not stop taking the antibiotic even if you start to feel better.  Keep all follow-up visits as told by your health care provider. This is important. Contact a health care provider if:  You have bleeding from your nose.  There is a lump on your neck.  You are not getting better in 5 days.  You feel worse instead of better. Get help right away if:  You have severe pain that is not controlled with medicine.  You have swelling, redness, or pain around your ear.  You have stiffness in your neck.  A part of your face is paralyzed.  The bone behind your ear (mastoid) is tender when you touch it.  You develop a severe headache. Summary  Otitis media is redness, soreness, and swelling of the middle ear.  This condition usually goes away on its own within 3-5 days.  If the problem does not go away in 3-5 days, your health care provider may prescribe or recommend medicines to treat your symptoms.  If you were prescribed an antibiotic medicine, take it as told by your health care provider. This information is not intended to replace advice given to you by   your health care provider. Make sure you discuss any questions you have with your health care provider. Document Revised: 07/03/2017 Document Reviewed: 07/11/2016 Elsevier Patient Education  [place].  How to Perform a Sinus Rinse A sinus rinse is a home treatment. It rinses your sinuses with a mixture of salt and water (saline solution). Sinuses are air-filled spaces in your skull behind the bones of your face and forehead. They open into your nasal cavity. A sinus rinse can help to clear your nasal cavity. It can clear mucus, dirt, dust,  or pollen. You may do a sinus rinse when you have:  A cold.  A virus.  Allergies.  A sinus infection.  A stuffy nose. Talk with your doctor about whether a sinus rinse might help you. What are the risks? A sinus rinse is normally very safe and helpful. However, there are a few risks. These include:  A burning feeling in the sinuses. This may happen if you do not make the saline solution as instructed. Be sure to follow all directions when making the saline solution.  Nasal irritation.  Infection from unclean water. This is rare, but possible. Do not do a sinus rinse if you have had:  Ear or nasal surgery.  An ear infection.  Blocked ears. Supplies needed:  Saline solution or powder.  Distilled or germ-free (sterile) water may be needed to mix with saline powder. ? You may use boiled and cooled tap water. Boil tap water for 5 minutes; cool until it is lukewarm. Use within 24 hours. ? Do not use regular tap water to mix with the saline solution.  Neti pot or nasal rinse bottle. This releases the saline solution into your nose and through your sinuses. You can buy neti pots and rinse bottles: ? At your local pharmacy. ? At a health food store. ? Online. How to perform a sinus rinse  1. Wash your hands with soap and water. 2. Wash your device using the directions that came with it. 3. Dry your device. 4. Use the solution that comes with your device or one that is sold separately in stores. Follow the mixing directions on the package if you need to mix with sterile or distilled water. 5. Fill your device with the amount of saline solution stated in the device instructions. 6. Stand over a sink and tilt your head sideways over the sink. 7. Place the spout of the device in your upper nostril (the one closer to the ceiling). 8. Gently pour or squeeze the saline solution into your nasal cavity. The liquid should drain to your lower nostril if you are not too stuffed up  (congested). 9. While rinsing, breathe through your open mouth. 10. Gently blow your nose to clear any mucus and rinse solution. Blowing too hard may cause ear pain. 11. Repeat in your other nostril. 12. Clean and rinse your device with clean water. 13. Air-dry your device. Talk with your doctor or pharmacist if you have questions about how to do a sinus rinse. Summary  A sinus rinse is a home treatment. It rinses your sinuses with a mixture of salt and water (saline solution).  A sinus rinse is normally very safe and helpful. Follow all instructions carefully.  Talk with your doctor about whether a sinus rinse might help you. This information is not intended to replace advice given to you by your health care provider. Make sure you discuss any questions you have with your health care provider. Document Revised:  05/18/2017 Document Reviewed: 05/18/2017 Elsevier Patient Education  [place].

## 2020-02-14 ENCOUNTER — Telehealth: Payer: Self-pay

## 2020-02-14 NOTE — Telephone Encounter (Signed)
Luis Villegas Key: [id number] help? Call us at [phone] Status Additional Information Required Drug Ciprodex 0.3-0.1% suspension Form   More information was needed. Pt insurance stated that Rx should be changed to one that is covered:  Ofloxacin  Neomycin- polymyxin-HC  Ciprofloxacin HCL   Called pt to see if he was able to receive the drops and he stated that he just paid out-of- pocket for the medication. Pt stated that the medication is working well. No further action needed.

## 2020-02-15 NOTE — Telephone Encounter (Signed)
[facility] Medicare stated they need medical information on the medication Ciprofloxacin-HCL.  They ask for a call back at [phone] opt 5

## 2020-02-15 NOTE — Telephone Encounter (Signed)
Spoke to [employer] and they stated that more inforamtion was needed. Pa was completed over the phone and insurance company stated they will call back with the answer today!

## 2020-02-21 NOTE — Telephone Encounter (Signed)
Pt already has medication

## 2020-03-20 ENCOUNTER — Telehealth: Payer: Self-pay | Admitting: Family Medicine

## 2020-03-20 NOTE — Telephone Encounter (Signed)
The patient's wife called to see if we can have the code changed for his physical that he had 2020. Medicare didn't pay anything and it was being sent to collections and she went ahead and paid it to keep it from going to collections. Now she wants to know why the nurse doesn't know what code to put for medicare physicals so this doesn't keep happening.

## 2020-03-22 NOTE — Telephone Encounter (Signed)
Spoke with Patient. Will send information to [facility] to investigate.

## 2020-04-02 ENCOUNTER — Encounter: Payer: Self-pay | Admitting: Family Medicine

## 2020-04-04 DIAGNOSIS — C61 Malignant neoplasm of prostate: Secondary | ICD-10-CM | POA: Diagnosis not present

## 2020-04-11 DIAGNOSIS — C61 Malignant neoplasm of prostate: Secondary | ICD-10-CM | POA: Diagnosis not present

## 2020-04-11 DIAGNOSIS — N200 Calculus of kidney: Secondary | ICD-10-CM | POA: Diagnosis not present

## 2020-04-11 DIAGNOSIS — R350 Frequency of micturition: Secondary | ICD-10-CM | POA: Diagnosis not present

## 2020-04-11 DIAGNOSIS — N201 Calculus of ureter: Secondary | ICD-10-CM | POA: Diagnosis not present

## 2020-04-11 DIAGNOSIS — R351 Nocturia: Secondary | ICD-10-CM | POA: Diagnosis not present

## 2020-04-11 DIAGNOSIS — Z8549 Personal history of malignant neoplasm of other male genital organs: Secondary | ICD-10-CM | POA: Diagnosis not present

## 2020-04-14 ENCOUNTER — Other Ambulatory Visit: Payer: Self-pay | Admitting: Family Medicine

## 2020-04-18 ENCOUNTER — Other Ambulatory Visit: Payer: Self-pay

## 2020-04-18 ENCOUNTER — Encounter: Payer: Medicare Other | Admitting: Family Medicine

## 2020-04-18 ENCOUNTER — Ambulatory Visit (INDEPENDENT_AMBULATORY_CARE_PROVIDER_SITE_OTHER): Payer: Medicare Other

## 2020-04-18 VITALS — BP 120/70 | HR 88 | Temp 98.3°F | Ht 66.0 in | Wt 172.4 lb

## 2020-04-18 DIAGNOSIS — Z0000 Encounter for general adult medical examination without abnormal findings: Secondary | ICD-10-CM | POA: Diagnosis not present

## 2020-04-18 DIAGNOSIS — Z23 Encounter for immunization: Secondary | ICD-10-CM

## 2020-04-18 NOTE — Patient Instructions (Signed)
Luis Villegas , Thank you for taking time to come for your Medicare Wellness Visit. I appreciate your ongoing commitment to your health goals. Please review the following plan we discussed and let me know if I can assist you in the future.   Screening recommendations/referrals: Colonoscopy: Up to date, next due 12/21/2022 Recommended yearly ophthalmology/optometry visit for glaucoma screening and checkup Recommended yearly dental visit for hygiene and checkup  Vaccinations: Influenza vaccine: Completed during today's visit Pneumococcal vaccine: Currently due, you may receive your pneumovax 23 at your next office visit  Tdap vaccine: Up to date, next due 05/04/2028 Shingles vaccine: Currently Due for shingrix, you may contact your pharmacy to discuss and to obtain vaccines    Advanced directives: Advance directive discussed with you today. Even though you declined this today please call our office should you change your mind and we can give you the proper paperwork for you to fill out.   Conditions/risks identified: None   Next appointment: 04/24/2020 @ 7:40 am for CPE labs  Preventive Care 73 Years and Older, Male Preventive care refers to lifestyle choices and visits with your health care provider that can promote health and wellness. What does preventive care include?  A yearly physical exam. This is also called an annual well check.  Dental exams once or twice a year.  Routine eye exams. Ask your health care provider how often you should have your eyes checked.  Personal lifestyle choices, including:  Daily care of your teeth and gums.  Regular physical activity.  Eating a healthy diet.  Avoiding tobacco and drug use.  Limiting alcohol use.  Practicing safe sex.  Taking low doses of aspirin every day.  Taking vitamin and mineral supplements as recommended by your health care provider. What happens during an annual well check? The services and screenings done by your  health care provider during your annual well check will depend on your age, overall health, lifestyle risk factors, and family history of disease. Counseling  Your health care provider may ask you questions about your:  Alcohol use.  Tobacco use.  Drug use.  Emotional well-being.  Home and relationship well-being.  Sexual activity.  Eating habits.  History of falls.  Memory and ability to understand (cognition).  Work and work Statistician. Screening  You may have the following tests or measurements:  Height, weight, and BMI.  Blood pressure.  Lipid and cholesterol levels. These may be checked every 5 years, or more frequently if you are over 12 years old.  Skin check.  Lung cancer screening. You may have this screening every year starting at age 73 if you have a 30-pack-year history of smoking and currently smoke or have quit within the past 15 years.  Fecal occult blood test (FOBT) of the stool. You may have this test every year starting at age 73.  Flexible sigmoidoscopy or colonoscopy. You may have a sigmoidoscopy every 5 years or a colonoscopy every 10 years starting at age 73.  Prostate cancer screening. Recommendations will vary depending on your family history and other risks.  Hepatitis C blood test.  Hepatitis B blood test.  Sexually transmitted disease (STD) testing.  Diabetes screening. This is done by checking your blood sugar (glucose) after you have not eaten for a while (fasting). You may have this done every 1-3 years.  Abdominal aortic aneurysm (AAA) screening. You may need this if you are a current or former smoker.  Osteoporosis. You may be screened starting at age 73 if  you are at high risk. Talk with your health care provider about your test results, treatment options, and if necessary, the need for more tests. Vaccines  Your health care provider may recommend certain vaccines, such as:  Influenza vaccine. This is recommended every  year.  Tetanus, diphtheria, and acellular pertussis (Tdap, Td) vaccine. You may need a Td booster every 10 years.  Zoster vaccine. You may need this after age 38.  Pneumococcal 13-valent conjugate (PCV13) vaccine. One dose is recommended after age 72.  Pneumococcal polysaccharide (PPSV23) vaccine. One dose is recommended after age 87. Talk to your health care provider about which screenings and vaccines you need and how often you need them. This information is not intended to replace advice given to you by your health care provider. Make sure you discuss any questions you have with your health care provider. Document Released: 08/17/2015 Document Revised: 04/09/2016 Document Reviewed: 05/22/2015 Elsevier Interactive Patient Education  2017 [employer] Prevention in the Home Falls can cause injuries. They can happen to people of all ages. There are many things you can do to make your home safe and to help prevent falls. What can I do on the outside of my home?  Regularly fix the edges of walkways and driveways and fix any cracks.  Remove anything that might make you trip as you walk through a door, such as a raised step or threshold.  Trim any bushes or trees on the path to your home.  Use bright outdoor lighting.  Clear any walking paths of anything that might make someone trip, such as rocks or tools.  Regularly check to see if handrails are loose or broken. Make sure that both sides of any steps have handrails.  Any raised decks and porches should have guardrails on the edges.  Have any leaves, snow, or ice cleared regularly.  Use sand or salt on walking paths during winter.  Clean up any spills in your garage right away. This includes oil or grease spills. What can I do in the bathroom?  Use night lights.  Install grab bars by the toilet and in the tub and shower. Do not use towel bars as grab bars.  Use non-skid mats or decals in the tub or shower.  If you  need to sit down in the shower, use a plastic, non-slip stool.  Keep the floor dry. Clean up any water that spills on the floor as soon as it happens.  Remove soap buildup in the tub or shower regularly.  Attach bath mats securely with double-sided non-slip rug tape.  Do not have throw rugs and other things on the floor that can make you trip. What can I do in the bedroom?  Use night lights.  Make sure that you have a light by your bed that is easy to reach.  Do not use any sheets or blankets that are too big for your bed. They should not hang down onto the floor.  Have a firm chair that has side arms. You can use this for support while you get dressed.  Do not have throw rugs and other things on the floor that can make you trip. What can I do in the kitchen?  Clean up any spills right away.  Avoid walking on wet floors.  Keep items that you use a lot in easy-to-reach places.  If you need to reach something above you, use a strong step stool that has a grab bar.  Keep electrical cords  out of the way.  Do not use floor polish or wax that makes floors slippery. If you must use wax, use non-skid floor wax.  Do not have throw rugs and other things on the floor that can make you trip. What can I do with my stairs?  Do not leave any items on the stairs.  Make sure that there are handrails on both sides of the stairs and use them. Fix handrails that are broken or loose. Make sure that handrails are as long as the stairways.  Check any carpeting to make sure that it is firmly attached to the stairs. Fix any carpet that is loose or worn.  Avoid having throw rugs at the top or bottom of the stairs. If you do have throw rugs, attach them to the floor with carpet tape.  Make sure that you have a light switch at the top of the stairs and the bottom of the stairs. If you do not have them, ask someone to add them for you. What else can I do to help prevent falls?  Wear shoes  that:  Do not have high heels.  Have rubber bottoms.  Are comfortable and fit you well.  Are closed at the toe. Do not wear sandals.  If you use a stepladder:  Make sure that it is fully opened. Do not climb a closed stepladder.  Make sure that both sides of the stepladder are locked into place.  Ask someone to hold it for you, if possible.  Clearly mark and make sure that you can see:  Any grab bars or handrails.  First and last steps.  Where the edge of each step is.  Use tools that help you move around (mobility aids) if they are needed. These include:  Canes.  Walkers.  Scooters.  Crutches.  Turn on the lights when you go into a dark area. Replace any light bulbs as soon as they burn out.  Set up your furniture so you have a clear path. Avoid moving your furniture around.  If any of your floors are uneven, fix them.  If there are any pets around you, be aware of where they are.  Review your medicines with your doctor. Some medicines can make you feel dizzy. This can increase your chance of falling. Ask your doctor what other things that you can do to help prevent falls. This information is not intended to replace advice given to you by your health care provider. Make sure you discuss any questions you have with your health care provider. Document Released: 05/17/2009 Document Revised: 12/27/2015 Document Reviewed: 08/25/2014 Elsevier Interactive Patient Education  2017 [employer].

## 2020-04-18 NOTE — Progress Notes (Signed)
Subjective:   Luis Villegas is a 74 y.o. male who presents for Medicare Annual/Subsequent preventive examination.  Review of Systems    N/A Cardiac Risk Factors include: advanced age (>43men, >31 women);dyslipidemia;male gender     Objective:    Today's Vitals   04/18/20 0939  BP: 120/70  Pulse: 88  Temp: 98.3 F (36.8 C)  TempSrc: Oral  SpO2: 97%  Weight: 172 lb 7 oz (78.2 kg)  Height: 5\' 6"  (1.676 m)  PainSc: 3    Body mass index is 27.83 kg/m.  Advanced Directives 04/18/2020  Does Patient Have a Medical Advance Directive? No  Would patient like information on creating a medical advance directive? No - Patient declined    Current Medications (verified) Outpatient Encounter Medications as of 04/18/2020  Medication Sig  . acetaminophen (TYLENOL) 500 MG tablet Take 500 mg by mouth every 6 (six) hours as needed.  Marland Kitchen acyclovir (ZOVIRAX) 200 MG capsule Take 1 capsule (200 mg total) by mouth daily.  Marland Kitchen buPROPion (WELLBUTRIN XL) 150 MG 24 hr tablet Take 1 tablet (150 mg total) by mouth daily.  . citalopram (CELEXA) 20 MG tablet Take 1 tablet (20 mg total) by mouth daily.  Marland Kitchen ezetimibe-simvastatin (VYTORIN) 10-40 MG tablet Take 1 tablet by mouth at bedtime.  . Multiple Vitamin (MULTIVITAMIN ADULT PO) Take by mouth.  . Omega-3 Fatty Acids (FISH OIL PO) Take by mouth.  Marland Kitchen omeprazole (PRILOSEC) 20 MG capsule TAKE 1 CAPSULE BY MOUTH EVERY DAY  . solifenacin (VESICARE) 5 MG tablet Take 5 mg by mouth daily.  . SUMAtriptan (IMITREX) 100 MG tablet TAKE 1 TABLET BY MOUTH AS NEEDED FOR HEADACHE  . topiramate (TOPAMAX) 200 MG tablet TAKE 1 TABLET(200 MG) BY MOUTH AT BEDTIME  . triamcinolone (KENALOG) 0.025 % cream Apply 1 application topically 2 (two) times daily.   No facility-administered encounter medications on file as of 04/18/2020.    Allergies (verified) Lipitor [atorvastatin calcium]   History: Past Medical History:  Diagnosis Date  . Allergy   . Colon polyps  2016  . Depression   . GERD (gastroesophageal reflux disease)   . Heart murmur   . Hyperlipidemia   . IBS (irritable bowel syndrome)   . Kidney stones    stage 3   . Prostate cancer [facility]) 2003   sx 2003 and radation  . Sleep apnea    no cpap per pt  . Sleep apnea with use of continuous positive airway pressure (CPAP)    Past Surgical History:  Procedure Laterality Date  . CARPAL TUNNEL RELEASE Bilateral   . CERVICAL FUSION    . COLONOSCOPY  2007, 08/09/2014   2007 [facility]/ 2016 [place]  . COLONOSCOPY W/ POLYPECTOMY  2002  . POLYPECTOMY  2016   polyps x3 T.A.  . PROSTATECTOMY  2003   Dr.Davis  . TONSILLECTOMY     Family History  Problem Relation Age of Onset  . Stroke Father 27  . Bladder Cancer Father   . Breast cancer Mother   . Diabetes Mother   . Neuropathy Mother   . Valvular heart disease Mother   . Heart attack Mother 59  . Prostate cancer Brother   . Heart disease Brother   . Heart attack Brother   . Prostate cancer Brother   . Colon cancer Neg Hx   . Colon polyps Neg Hx   . Esophageal cancer Neg Hx   . Stomach cancer Neg Hx   . Rectal cancer Neg Hx  Social History   Socioeconomic History  . Marital status: Married    Spouse name: Not on file  . Number of children: 1  . Years of education: Not on file  . Highest education level: Not on file  Occupational History  . Occupation: Retired  Tobacco Use  . Smoking status: Former Smoker    Quit date: 08/04/1993    Years since quitting: 26.7  . Smokeless tobacco: Never Used  Vaping Use  . Vaping Use: Never used  Substance and Sexual Activity  . Alcohol use: No  . Drug use: No  . Sexual activity: Not on file  Other Topics Concern  . Not on file  Social History Narrative  . Not on file   Social Determinants of Health   Financial Resource Strain: Low Risk   . Difficulty of Paying Living Expenses: Not hard at all  Food Insecurity: No Food Insecurity  . Worried About Charity fundraiser in the  Last Year: Never true  . Ran Out of Food in the Last Year: Never true  Transportation Needs: No Transportation Needs  . Lack of Transportation (Medical): No  . Lack of Transportation (Non-Medical): No  Physical Activity: Insufficiently Active  . Days of Exercise per Week: 2 days  . Minutes of Exercise per Session: 30 min  Stress: No Stress Concern Present  . Feeling of Stress : Not at all  Social Connections: Socially Integrated  . Frequency of Communication with Friends and Family: More than three times a week  . Frequency of Social Gatherings with Friends and Family: Three times a week  . Attends Religious Services: More than 4 times per year  . Active Member of Clubs or Organizations: Yes  . Attends Archivist Meetings: More than 4 times per year  . Marital Status: Married    Tobacco Counseling Counseling given: Not Answered   Clinical Intake:  Pre-visit preparation completed: Yes  Pain : 0-10 Pain Score: 3  Pain Type: Chronic pain Pain Location: Other (Comment) (Joint) Pain Descriptors / Indicators: Sore Pain Onset: More than a month ago Pain Relieving Factors: sleeping  Pain Relieving Factors: sleeping  Nutritional Risks: Nausea/ vomitting/ diarrhea (Diarrhea, and constipation) Diabetes: No CBG done?: No Did pt. bring in CBG monitor from home?: No  How often do you need to have someone help you when you read instructions, pamphlets, or other written materials from your doctor or pharmacy?: 1 - Never What is the last grade level you completed in school?: 2 years of college  Diabetic?No  Interpreter Needed?: No  Information entered by :: [place] of Daily Living In your present state of health, do you have any difficulty performing the following activities: 04/18/2020  Hearing? Y  Comment Patient reports having decreased hearing. Has to turn television up to hear it.  Vision? N  Difficulty concentrating or making decisions? Y    Comment Has issues with short term memory  Walking or climbing stairs? N  Dressing or bathing? N  Doing errands, shopping? N  Preparing Food and eating ? N  Using the Toilet? N  In the past six months, have you accidently leaked urine? Y  Comment Has hx of prostate cancer has occassional bladder leakage  Do you have problems with loss of bowel control? N  Managing your Medications? N  Managing your Finances? N  Some recent data might be hidden    Patient Care Team: Billie Ruddy, MD as PCP - General (Family  Medicine)  Indicate any recent Medical Services you may have received from other than [facility] providers in the past year (date may be approximate).     Assessment:   This is a routine wellness examination for Kajuan.  Hearing/Vision screen  Hearing Screening   125Hz  250Hz  500Hz  1000Hz  2000Hz  3000Hz  4000Hz  6000Hz  8000Hz   Right ear:           Left ear:           Vision Screening Comments: Patient states gets eye checked every year   Dietary issues and exercise activities discussed: Current Exercise Habits: Home exercise routine, Type of exercise: treadmill, Time (Minutes): 30, Frequency (Times/Week): 2, Weekly Exercise (Minutes/Week): 60, Intensity: Mild  Goals    . Exercise 150 min/wk Moderate Activity      Depression Screen PHQ 2/9 Scores 04/18/2020 12/23/2017 07/08/2017  PHQ - 2 Score 0 0 0  PHQ- 9 Score 0 2 -    Fall Risk Fall Risk  04/18/2020 07/08/2017  Falls in the past year? 1 No  Number falls in past yr: 0 -  Injury with Fall? 0 -  Risk for fall due to : History of fall(s) -  Follow up Falls evaluation completed;Falls prevention discussed -    Any stairs in or around the home? Yes  If so, are there any without handrails? No  Home free of loose throw rugs in walkways, pet beds, electrical cords, etc? Yes  Adequate lighting in your home to reduce risk of falls? Yes   ASSISTIVE DEVICES UTILIZED TO PREVENT FALLS:  Life alert? No  Use of a cane, walker or  w/c? No  Grab bars in the bathroom? No  Shower chair or bench in shower? No  Elevated toilet seat or a handicapped toilet? No   TIMED UP AND GO:  Was the test performed? Yes .  Length of time to ambulate 10 feet: 6 sec.   Gait steady and fast without use of assistive device  Cognitive Function:     6CIT Screen 04/18/2020  What Year? 0 points  What month? 0 points  What time? 0 points  Count back from 20 0 points  Months in reverse 0 points  Repeat phrase 4 points  Total Score 4    Immunizations Immunization History  Administered Date(s) Administered  . Fluad Quad(high Dose 65+) 04/18/2020  . Influenza, High Dose Seasonal PF 04/07/2018, 03/25/2019, 03/25/2019  . Influenza-Unspecified 03/25/2019  . PFIZER SARS-COV-2 Vaccination 09/18/2019, 10/11/2019  . Td 04/12/2008  . Tdap 05/04/2018  . Zoster 01/25/2010    TDAP status: Up to date Flu Vaccine status: Completed at today's visit Pneumococcal vaccine status: Declined,  Education has been provided regarding the importance of this vaccine but patient still declined. Advised may receive this vaccine at local pharmacy or Health Dept. Aware to provide a copy of the vaccination record if obtained from local pharmacy or Health Dept. Verbalized acceptance and understanding.  Covid-19 vaccine status: Completed vaccines  Qualifies for Shingles Vaccine? Yes   Zostavax completed Yes   Shingrix Completed?: No.    Education has been provided regarding the importance of this vaccine. Patient has been advised to call insurance company to determine out of pocket expense if they have not yet received this vaccine. Advised may also receive vaccine at local pharmacy or Health Dept. Verbalized acceptance and understanding.  Screening Tests Health Maintenance  Topic Date Due  . Hepatitis C Screening  Never done  . PNA vac Low Risk Adult (1 of  2 - PCV13) Never done  . COLONOSCOPY  12/21/2022  . TETANUS/TDAP  05/04/2028  . INFLUENZA VACCINE   Completed  . COVID-19 Vaccine  Completed    Health Maintenance  Health Maintenance Due  Topic Date Due  . Hepatitis C Screening  Never done  . PNA vac Low Risk Adult (1 of 2 - PCV13) Never done    Colorectal cancer screening: Completed 12/21/2019. Repeat every 3 years  Lung Cancer Screening: (Low Dose CT Chest recommended if Age 39-80 years, 30 pack-year currently smoking OR have quit w/in 15years.) does not qualify.   Lung Cancer Screening Referral: No  Additional Screening:  Hepatitis C Screening: does qualify;   Vision Screening: Recommended annual ophthalmology exams for early detection of glaucoma and other disorders of the eye. Is the patient up to date with their annual eye exam?  Yes  Who is the provider or what is the name of the office in which the patient attends annual eye exams? [facility] Dr. Trudie Buckler  If pt is not established with a provider, would they like to be referred to a provider to establish care? No .   Dental Screening: Recommended annual dental exams for proper oral hygiene  Community Resource Referral / Chronic Care Management: CRR required this visit?  No   CCM required this visit?  No      Plan:     I have personally reviewed and noted the following in the patient's chart:   . Medical and social history . Use of alcohol, tobacco or illicit drugs  . Current medications and supplements . Functional ability and status . Nutritional status . Physical activity . Advanced directives . List of other physicians . Hospitalizations, surgeries, and ER visits in previous 12 months . Vitals . Screenings to include cognitive, depression, and falls . Referrals and appointments  In addition, I have reviewed and discussed with patient certain preventive protocols, quality metrics, and best practice recommendations. A written personalized care plan for preventive services as well as general preventive health recommendations were provided to  patient.     Ofilia Neas, LPN   0/14/1030   Nurse Notes: None

## 2020-04-24 ENCOUNTER — Other Ambulatory Visit: Payer: Medicare Other

## 2020-05-11 DIAGNOSIS — Z23 Encounter for immunization: Secondary | ICD-10-CM | POA: Diagnosis not present

## 2020-05-16 ENCOUNTER — Encounter: Payer: Self-pay | Admitting: Family Medicine

## 2020-05-17 ENCOUNTER — Other Ambulatory Visit: Payer: Self-pay | Admitting: Family Medicine

## 2020-05-17 DIAGNOSIS — E782 Mixed hyperlipidemia: Secondary | ICD-10-CM

## 2020-05-17 DIAGNOSIS — N1832 Chronic kidney disease, stage 3b: Secondary | ICD-10-CM

## 2020-05-17 DIAGNOSIS — Z8546 Personal history of malignant neoplasm of prostate: Secondary | ICD-10-CM

## 2020-05-17 NOTE — Telephone Encounter (Signed)
Spoke with pt scheduled appointment for Lab on 05/23/2020 at 8 am per Dr Volanda Napoleon

## 2020-05-23 ENCOUNTER — Other Ambulatory Visit (INDEPENDENT_AMBULATORY_CARE_PROVIDER_SITE_OTHER): Payer: Medicare Other

## 2020-05-23 ENCOUNTER — Other Ambulatory Visit: Payer: Self-pay

## 2020-05-23 DIAGNOSIS — Z8546 Personal history of malignant neoplasm of prostate: Secondary | ICD-10-CM

## 2020-05-23 DIAGNOSIS — E782 Mixed hyperlipidemia: Secondary | ICD-10-CM | POA: Diagnosis not present

## 2020-05-23 DIAGNOSIS — N1832 Chronic kidney disease, stage 3b: Secondary | ICD-10-CM

## 2020-05-24 LAB — BASIC METABOLIC PANEL WITH GFR
BUN/Creatinine Ratio: 17 (calc) (ref 6–22)
BUN: 24 mg/dL (ref 7–25)
CO2: 25 mmol/L (ref 20–32)
Calcium: 10.3 mg/dL (ref 8.6–10.3)
Chloride: 107 mmol/L (ref 98–110)
Creat: 1.44 mg/dL — ABNORMAL HIGH (ref 0.70–1.18)
GFR, Est African American: 55 mL/min/{1.73_m2} — ABNORMAL LOW (ref >=60)
GFR, Est Non African American: 48 mL/min/{1.73_m2} — ABNORMAL LOW (ref >=60)
Glucose, Bld: 108 mg/dL — ABNORMAL HIGH (ref 65–99)
Potassium: 4.1 mmol/L (ref 3.5–5.3)
Sodium: 141 mmol/L (ref 135–146)

## 2020-05-24 LAB — CBC WITH DIFFERENTIAL/PLATELET
Absolute Monocytes: 493 cells/uL (ref 200–950)
Basophils Absolute: 48 cells/uL (ref 0–200)
Basophils Relative: 0.9 %
Eosinophils Absolute: 122 cells/uL (ref 15–500)
Eosinophils Relative: 2.3 %
HCT: 40 % (ref 38.5–50.0)
Hemoglobin: 12.7 g/dL — ABNORMAL LOW (ref 13.2–17.1)
Lymphs Abs: 1696 cells/uL (ref 850–3900)
MCH: 28.9 pg (ref 27.0–33.0)
MCHC: 31.8 g/dL — ABNORMAL LOW (ref 32.0–36.0)
MCV: 90.9 fL (ref 80.0–100.0)
MPV: 9.5 fL (ref 7.5–12.5)
Monocytes Relative: 9.3 %
Neutro Abs: 2942 cells/uL (ref 1500–7800)
Neutrophils Relative %: 55.5 %
Platelets: 268 10*3/uL (ref 140–400)
RBC: 4.4 10*6/uL (ref 4.20–5.80)
RDW: 13.4 % (ref 11.0–15.0)
Total Lymphocyte: 32 %
WBC: 5.3 10*3/uL (ref 3.8–10.8)

## 2020-05-24 LAB — LIPID PANEL
Cholesterol: 149 mg/dL (ref <200)
HDL: 47 mg/dL (ref >=40)
LDL Cholesterol (Calc): 75 mg/dL (calc)
Non-HDL Cholesterol (Calc): 102 mg/dL (calc) (ref <130)
Total CHOL/HDL Ratio: 3.2 (calc) (ref <5.0)
Triglycerides: 173 mg/dL — ABNORMAL HIGH (ref <150)

## 2020-05-24 LAB — PSA: PSA: 2.85 ng/mL (ref <=4.0)

## 2020-05-27 ENCOUNTER — Other Ambulatory Visit: Payer: Self-pay | Admitting: Family Medicine

## 2020-05-29 DIAGNOSIS — H1013 Acute atopic conjunctivitis, bilateral: Secondary | ICD-10-CM | POA: Diagnosis not present

## 2020-05-29 DIAGNOSIS — Z961 Presence of intraocular lens: Secondary | ICD-10-CM | POA: Diagnosis not present

## 2020-07-11 ENCOUNTER — Other Ambulatory Visit: Payer: Self-pay | Admitting: Family Medicine

## 2020-07-17 ENCOUNTER — Encounter: Payer: Self-pay | Admitting: Family Medicine

## 2020-08-08 ENCOUNTER — Other Ambulatory Visit: Payer: Self-pay

## 2020-08-08 ENCOUNTER — Ambulatory Visit (INDEPENDENT_AMBULATORY_CARE_PROVIDER_SITE_OTHER): Payer: Medicare Other | Admitting: Family Medicine

## 2020-08-08 ENCOUNTER — Encounter: Payer: Self-pay | Admitting: Family Medicine

## 2020-08-08 VITALS — Temp 98.1°F | Wt 175.4 lb

## 2020-08-08 DIAGNOSIS — R0683 Snoring: Secondary | ICD-10-CM

## 2020-08-08 DIAGNOSIS — R519 Headache, unspecified: Secondary | ICD-10-CM | POA: Diagnosis not present

## 2020-08-08 DIAGNOSIS — R2689 Other abnormalities of gait and mobility: Secondary | ICD-10-CM

## 2020-08-08 DIAGNOSIS — H9193 Unspecified hearing loss, bilateral: Secondary | ICD-10-CM

## 2020-08-08 LAB — COMPREHENSIVE METABOLIC PANEL
ALT: 20 U/L (ref 0–53)
AST: 17 U/L (ref 0–37)
Albumin: 4.7 g/dL (ref 3.5–5.2)
Alkaline Phosphatase: 46 U/L (ref 39–117)
BUN: 23 mg/dL (ref 6–23)
CO2: 26 mEq/L (ref 19–32)
Calcium: 9.5 mg/dL (ref 8.4–10.5)
Chloride: 106 mEq/L (ref 96–112)
Creatinine, Ser: 1.47 mg/dL (ref 0.40–1.50)
GFR: 47.07 mL/min — ABNORMAL LOW (ref >60.00)
Glucose, Bld: 85 mg/dL (ref 70–99)
Potassium: 4.1 mEq/L (ref 3.5–5.1)
Sodium: 139 mEq/L (ref 135–145)
Total Bilirubin: 0.4 mg/dL (ref 0.2–1.2)
Total Protein: 7.3 g/dL (ref 6.0–8.3)

## 2020-08-08 LAB — CBC WITH DIFFERENTIAL/PLATELET
Basophils Absolute: 0 10*3/uL (ref 0.0–0.1)
Basophils Relative: 0.7 % (ref 0.0–3.0)
Eosinophils Absolute: 0.1 10*3/uL (ref 0.0–0.7)
Eosinophils Relative: 1.9 % (ref 0.0–5.0)
HCT: 35.7 % — ABNORMAL LOW (ref 39.0–52.0)
Hemoglobin: 11.7 g/dL — ABNORMAL LOW (ref 13.0–17.0)
Lymphocytes Relative: 35.5 % (ref 12.0–46.0)
Lymphs Abs: 1.8 10*3/uL (ref 0.7–4.0)
MCHC: 32.8 g/dL (ref 30.0–36.0)
MCV: 88.1 fl (ref 78.0–100.0)
Monocytes Absolute: 0.4 10*3/uL (ref 0.1–1.0)
Monocytes Relative: 8.4 % (ref 3.0–12.0)
Neutro Abs: 2.7 10*3/uL (ref 1.4–7.7)
Neutrophils Relative %: 53.5 % (ref 43.0–77.0)
Platelets: 234 10*3/uL (ref 150.0–400.0)
RBC: 4.05 Mil/uL — ABNORMAL LOW (ref 4.22–5.81)
RDW: 15 % (ref 11.5–15.5)
WBC: 5 10*3/uL (ref 4.0–10.5)

## 2020-08-08 LAB — T4, FREE: Free T4: 0.55 ng/dL — ABNORMAL LOW (ref 0.60–1.60)

## 2020-08-08 LAB — TSH: TSH: 0.76 u[IU]/mL (ref 0.35–4.50)

## 2020-08-08 LAB — MAGNESIUM: Magnesium: 2.2 mg/dL (ref 1.5–2.5)

## 2020-08-08 NOTE — Patient Instructions (Addendum)
DailyToday we have discussed increasing your intake of water. I have ordered some lab to look into your headaches and balance issues.  If you continue to have headaches despite increasing your water intake we will place a referral to the neurologist.  Have also placed a referral for a new sleep study and a referral to the audiologist for your hearing.  You should expect calls about these appointments.  Hearing Loss Hearing loss is a partial or total loss of the ability to hear. This can be temporary or permanent, and it can happen in one or both ears. Medical care is necessary to treat hearing loss properly and to prevent the condition from getting worse. Your hearing may partially or completely come back, depending on what caused your hearing loss and how severe it is. In some cases, hearing loss is permanent. What are the causes? Common causes of hearing loss include:  Too much wax in the ear canal.  Infection of the ear canal or middle ear.  Fluid in the middle ear.  Injury to the ear or surrounding area.  An object stuck in the ear.  A history of prolonged exposure to loud sounds, such as music. Less common causes of hearing loss include:  Tumors in the ear.  Viral or bacterial infections, such as meningitis.  A hole in the eardrum (perforated eardrum).  Problems with the hearing nerve that sends signals between the brain and the ear.  Certain medicines. What are the signs or symptoms? Symptoms of this condition may include:  Difficulty telling the difference between sounds.  Difficulty following a conversation when there is background noise.  Lack of response to sounds in your environment. This may be most noticeable when you do not respond to startling sounds.  Needing to turn up the volume on the television, radio, or other devices.  Ringing in the ears.  Dizziness. How is this diagnosed? This condition is diagnosed based on:  A physical exam.  A hearing test  (audiometry). The audiometry test will be performed by a hearing specialist (audiologist). You may also be referred to an ear, nose, and throat (ENT) specialist (otolaryngologist). How is this treated? Treatment for hearing loss may include:  Ear wax removal.  Medicines to treat or prevent infection (antibiotics).  Medicines to reduce inflammation (corticosteroids).  Hearing aids for hearing loss related to nerve damage. Follow these instructions at home:  If you were prescribed an antibiotic medicine, take it as told by your health care provider. Do not stop taking the antibiotic even if you start to feel better.  Take over-the-counter and prescription medicines only as told by your health care provider.  Avoid loud noises.  Return to your normal activities as told by your health care provider. Ask your health care provider what activities are safe for you.  Keep all follow-up visits as told by your health care provider. This is important. Contact a health care provider if:  You feel dizzy.  You develop new symptoms.  You vomit or feel nauseous.  You have a fever. Get help right away if:  You develop sudden changes in your vision.  You have severe ear pain.  You have new or increased weakness.  You have a severe headache. Summary  Hearing loss is a decreased ability to hear sounds around you. It can be temporary or permanent.  Treatment will depend on the cause of your hearing loss. It may include ear wax removal, medicines, or a hearing aid.  Your hearing  may partially or completely come back, depending on what caused your hearing loss and how severe it is.  Keep all follow-up visits as told by your health care provider. This is important. This information is not intended to replace advice given to you by your health care provider. Make sure you discuss any questions you have with your health care provider. Document Revised: 04/20/2018 Document Reviewed:  04/20/2018 Elsevier Patient Education  2020 [employer]  General Headache Without Cause A headache is pain or discomfort that is felt around the head or neck area. There are many causes and types of headaches. In some cases, the cause may not be found. Follow these instructions at home: Watch your condition for any changes. Let your doctor know about them. Take these steps to help with your condition: Managing pain      Take over-the-counter and prescription medicines only as told by your doctor.  Lie down in a dark, quiet room when you have a headache.  If told, put ice on your head and neck area: ? Put ice in a plastic bag. ? Place a towel between your skin and the bag. ? Leave the ice on for 20 minutes, 2-3 times per day.  If told, put heat on the affected area. Use the heat source that your doctor recommends, such as a moist heat pack or a heating pad. ? Place a towel between your skin and the heat source. ? Leave the heat on for 20-30 minutes. ? Remove the heat if your skin turns bright red. This is very important if you are unable to feel pain, heat, or cold. You may have a greater risk of getting burned.  Keep lights dim if bright lights bother you or make your headaches worse. Eating and drinking  Eat meals on a regular schedule.  If you drink alcohol: ? Limit how much you use to:  0-1 drink a day for women.  0-2 drinks a day for men. ? Be aware of how much alcohol is in your drink. In the [place] one drink equals one 12 oz bottle of beer (355 mL), one 5 oz glass of wine (148 mL), or one 1 oz glass of hard liquor (44 mL).  Stop drinking caffeine, or reduce how much caffeine you drink. General instructions   Keep a journal to find out if certain things bring on headaches. For example, write down: ? What you eat and drink. ? How much sleep you get. ? Any change to your diet or medicines.  Get a massage or try other ways to relax.  Limit stress.  Sit up  straight. Do not tighten (tense) your muscles.  Do not use any products that contain nicotine or tobacco. This includes cigarettes, e-cigarettes, and chewing tobacco. If you need help quitting, ask your doctor.  Exercise regularly as told by your doctor.  Get enough sleep. This often means 7-9 hours of sleep each night.  Keep all follow-up visits as told by your doctor. This is important. Contact a doctor if:  Your symptoms are not helped by medicine.  You have a headache that feels different than the other headaches.  You feel sick to your stomach (nauseous) or you throw up (vomit).  You have a fever. Get help right away if:  Your headache gets very bad quickly.  Your headache gets worse after a lot of physical activity.  You keep throwing up.  You have a stiff neck.  You have trouble seeing.  You have  trouble speaking.  You have pain in the eye or ear.  Your muscles are weak or you lose muscle control.  You lose your balance or have trouble walking.  You feel like you will pass out (faint) or you pass out.  You are mixed up (confused).  You have a seizure. Summary  A headache is pain or discomfort that is felt around the head or neck area.  There are many causes and types of headaches. In some cases, the cause may not be found.  Keep a journal to help find out what causes your headaches. Watch your condition for any changes. Let your doctor know about them.  Contact a doctor if you have a headache that is different from usual, or if your headache is not helped by medicine.  Get help right away if your headache gets very bad, you throw up, you have trouble seeing, you lose your balance, or you have a seizure. This information is not intended to replace advice given to you by your health care provider. Make sure you discuss any questions you have with your health care provider. Document Revised: 02/08/2018 Document Reviewed: 02/08/2018 Elsevier Patient  Education  [place] With Sleep Apnea Sleep apnea is a condition in which breathing pauses or becomes shallow during sleep. Sleep apnea is most commonly caused by a collapsed or blocked airway. People with sleep apnea snore loudly and have times when they gasp and stop breathing for 10 seconds or more during sleep. This happens over and over during the night. This disrupts your sleep and keeps your body from getting the rest that it needs, which can cause tiredness and lack of energy (fatigue) during the day. The breaks in breathing also interrupt the deep sleep that you need to feel rested. Even if you do not completely wake up from the gaps in breathing, your sleep may not be restful. You may also have a headache in the morning and low energy during the day, and you may feel anxious or depressed. How can sleep apnea affect me? Sleep apnea increases your chances of extreme tiredness during the day (daytime fatigue). It can also increase your risk for health conditions, such as:  Heart attack.  Stroke.  Diabetes.  Heart failure.  Irregular heartbeat.  High blood pressure. If you have daytime fatigue as a result of sleep apnea, you may be more likely to:  Perform poorly at school or work.  Fall asleep while driving.  Have difficulty with attention.  Develop depression or anxiety.  Become severely overweight (obese).  Have sexual dysfunction. What actions can I take to manage sleep apnea? Sleep apnea treatment   If you were given a device to open your airway while you sleep, use it only as told by your health care provider. You may be given: ? An oral appliance. This is a custom-made mouthpiece that shifts your lower jaw forward. ? A continuous positive airway pressure (CPAP) device. This device blows air through a mask when you breathe out (exhale). ? A nasal expiratory positive airway pressure (EPAP) device. This device has valves that you put into each  nostril. ? A bi-level positive airway pressure (BPAP) device. This device blows air through a mask when you breathe in (inhale) and breathe out (exhale).  You may need surgery if other treatments do not work for you. Sleep habits  Go to sleep and wake up at the same time every day. This helps set your internal clock (circadian  rhythm) for sleeping. ? If you stay up later than usual, such as on weekends, try to get up in the morning within 2 hours of your normal wake time.  Try to get at least 7-9 hours of sleep each night.  Stop computer, tablet, and mobile phone use a few hours before bedtime.  Do not take long naps during the day. If you nap, limit it to 30 minutes.  Have a relaxing bedtime routine. Reading or listening to music may relax you and help you sleep.  Use your bedroom only for sleep. ? Keep your television and computer out of your bedroom. ? Keep your bedroom cool, dark, and quiet. ? Use a supportive mattress and pillows.  Follow your health care provider's instructions for other changes to sleep habits. Nutrition  Do not eat heavy meals in the evening.  Do not have caffeine in the later part of the day. The effects of caffeine can last for more than 5 hours.  Follow your health care provider's or dietitian's instructions for any diet changes. Lifestyle      Do not drink alcohol before bedtime. Alcohol can cause you to fall asleep at first, but then it can cause you to wake up in the middle of the night and have trouble getting back to sleep.  Do not use any products that contain nicotine or tobacco, such as cigarettes and e-cigarettes. If you need help quitting, ask your health care provider. Medicines  Take over-the-counter and prescription medicines only as told by your health care provider.  Do not use over-the-counter sleep medicine. You can become dependent on this medicine, and it can make sleep apnea worse.  Do not use medicines, such as sedatives and  narcotics, unless told by your health care provider. Activity  Exercise on most days, but avoid exercising in the evening. Exercising near bedtime can interfere with sleeping.  If possible, spend time outside every day. Natural light helps regulate your circadian rhythm. General information  Lose weight if you need to, and maintain a healthy weight.  Keep all follow-up visits as told by your health care provider. This is important.  If you are having surgery, make sure to tell your health care provider that you have sleep apnea. You may need to bring your device with you. Where to find more information Learn more about sleep apnea and daytime fatigue from:  American Sleep Association: sleepassociation.[place]: sleepfoundation.org  National Heart, Lung, and Blood Institute: https://www.[place].biz/ Summary  Sleep apnea can cause daytime fatigue and other serious health conditions.  Both sleep apnea and daytime fatigue can be bad for your health and well-being.  You may need to wear a device while sleeping to help keep your airway open.  If you are having surgery, make sure to tell your health care provider that you have sleep apnea. You may need to bring your device with you.  Making changes to sleep habits, diet, lifestyle, and activity can help you manage sleep apnea. This information is not intended to replace advice given to you by your health care provider. Make sure you discuss any questions you have with your health care provider. Document Revised: 11/12/2018 Document Reviewed: 10/15/2017 Elsevier Patient Education  [place].  Dehydration, Adult Dehydration is condition in which there is not enough water or other fluids in the body. This happens when a person loses more fluids than he or she takes in. Important body parts cannot work right without the right amount  of fluids. Any loss of fluids from the body can cause dehydration. Dehydration can be  mild, worse, or very bad. It should be treated right away to keep it from getting very bad. What are the causes? This condition may be caused by:  Conditions that cause loss of water or other fluids, such as: ? Watery poop (diarrhea). ? Vomiting. ? Sweating a lot. ? Peeing (urinating) a lot.  Not drinking enough fluids, especially when you: ? Are ill. ? Are doing things that take a lot of energy to do.  Other illnesses and conditions, such as fever or infection.  Certain medicines, such as medicines that take extra fluid out of the body (diuretics).  Lack of safe drinking water.  Not being able to get enough water and food. What increases the risk? The following factors may make you more likely to develop this condition:  Having a long-term (chronic) illness that has not been treated the right way, such as: ? Diabetes. ? Heart disease. ? Kidney disease.  Being 58 years of age or older.  Having a disability.  Living in a place that is high above the ground or sea (high in altitude). The thinner, dried air causes more fluid loss.  Doing exercises that put stress on your body for a long time. What are the signs or symptoms? Symptoms of dehydration depend on how bad it is. Mild or worse dehydration  Thirst.  Dry lips or dry mouth.  Feeling dizzy or light-headed, especially when you stand up from sitting.  Muscle cramps.  Your body making: ? Dark pee (urine). Pee may be the color of tea. ? Less pee than normal. ? Less tears than normal.  Headache. Very bad dehydration  Changes in skin. Skin may: ? Be cold to the touch (clammy). ? Be blotchy or pale. ? Not go back to normal right after you lightly pinch it and let it go.  Little or no tears, pee, or sweat.  Changes in vital signs, such as: ? Fast breathing. ? Low blood pressure. ? Weak pulse. ? Pulse that is more than 100 beats a minute when you are sitting still.  Other changes, such as: ? Feeling very  thirsty. ? Eyes that look hollow (sunken). ? Cold hands and feet. ? Being mixed up (confused). ? Being very tired (lethargic) or having trouble waking from sleep. ? Short-term weight loss. ? Loss of consciousness. How is this treated? Treatment for this condition depends on how bad it is. Treatment should start right away. Do not wait until your condition gets very bad. Very bad dehydration is an emergency. You will need to go to a hospital.  Mild or worse dehydration can be treated at home. You may be asked to: ? Drink more fluids. ? Drink an oral rehydration solution (ORS). This drink helps get the right amounts of fluids and salts and minerals in the blood (electrolytes).  Very bad dehydration can be treated: ? With fluids through an IV tube. ? By getting normal levels of salts and minerals in your blood. This is often done by giving salts and minerals through a tube. The tube is passed through your nose and into your stomach. ? By treating the root cause. Follow these instructions at home: Oral rehydration solution If told by your doctor, drink an ORS:  Make an ORS. Use instructions on the package.  Start by drinking small amounts, about  cup (120 mL) every 5-10 minutes.  Slowly drink more until you  have had the amount that your doctor said to have. Eating and drinking         Drink enough clear fluid to keep your pee pale yellow. If you were told to drink an ORS, finish the ORS first. Then, start slowly drinking other clear fluids. Drink fluids such as: ? Water. Do not drink only water. Doing that can make the salt (sodium) level in your body get too low. ? Water from ice chips you suck on. ? Fruit juice that you have added water to (diluted). ? Low-calorie sports drinks.  Eat foods that have the right amounts of salts and minerals, such as: ? Bananas. ? Oranges. ? Potatoes. ? Tomatoes. ? Spinach.  Do not drink alcohol.  Avoid: ? Drinks that have a lot of  sugar. These include:  High-calorie sports drinks.  Fruit juice that you did not add water to.  Soda.  Caffeine. ? Foods that are greasy or have a lot of fat or sugar. General instructions  Take over-the-counter and prescription medicines only as told by your doctor.  Do not take salt tablets. Doing that can make the salt level in your body get too high.  Return to your normal activities as told by your doctor. Ask your doctor what activities are safe for you.  Keep all follow-up visits as told by your doctor. This is important. Contact a doctor if:  You have pain in your belly (abdomen) and the pain: ? Gets worse. ? Stays in one place.  You have a rash.  You have a stiff neck.  You get angry or annoyed (irritable) more easily than normal.  You are more tired or have a harder time waking than normal.  You feel: ? Weak or dizzy. ? Very thirsty. Get help right away if you have:  Any symptoms of very bad dehydration.  Symptoms of vomiting, such as: ? You cannot eat or drink without vomiting. ? Your vomiting gets worse or does not go away. ? Your vomit has blood or green stuff in it.  Symptoms that get worse with treatment.  A fever.  A very bad headache.  Problems with peeing or pooping (having a bowel movement), such as: ? Watery poop that gets worse or does not go away. ? Blood in your poop (stool). This may cause poop to look black and tarry. ? Not peeing in 6-8 hours. ? Peeing only a small amount of very dark pee in 6-8 hours.  Trouble breathing. These symptoms may be an emergency. Do not wait to see if the symptoms will go away. Get medical help right away. Call your local emergency services (911 in the [place] Do not drive yourself to the hospital. Summary  Dehydration is a condition in which there is not enough water or other fluids in the body. This happens when a person loses more fluids than he or she takes in.  Treatment for this condition depends  on how bad it is. Treatment should be started right away. Do not wait until your condition gets very bad.  Drink enough clear fluid to keep your pee pale yellow. If you were told to drink an oral rehydration solution (ORS), finish the ORS first. Then, start slowly drinking other clear fluids.  Take over-the-counter and prescription medicines only as told by your doctor.  Get help right away if you have any symptoms of very bad dehydration. This information is not intended to replace advice given to you by your health care provider.  Make sure you discuss any questions you have with your health care provider. Document Revised: 03/03/2019 Document Reviewed: 03/03/2019 Elsevier Patient Education  [place].

## 2020-08-08 NOTE — Addendum Note (Signed)
Addended by: Evert Kohl D on: 08/08/2020 03:04 PM   Modules accepted: Orders

## 2020-08-08 NOTE — Addendum Note (Signed)
Addended by: Yessenia Maillet D on: 08/08/2020 03:04 PM   Modules accepted: Orders  

## 2020-08-08 NOTE — Addendum Note (Signed)
Addended by: Evert Kohl D on: 08/08/2020 03:03 PM   Modules accepted: Orders

## 2020-08-08 NOTE — Progress Notes (Signed)
Subjective:    Patient ID: Luis Villegas, male    DOB: [birth year], 75 y.o.   MRN: [id number]  No chief complaint on file.   HPI Patient is a 75 yo male with pmh sig for allergies, h/o depression, GERD, heart murmur, CKD 3, h/o prostate cancer s/p xrt, OSA on CPAP, IBS, HLD who was seen today for acute concern. Pt endorses daily HAs x 1 month, different from his migraines.  Typically in frontal area of head, 5/10, dull ache which may move to middle of head.  Imitrex may helpfor ~6 hours. Pt drinking 2 glasses of water/day, 2 cups of coffee, and decaf tea daily.  Wakes up 4 x/night to urinate.  Denies sensitivity to light or sound, n/v, changes in vision- though notes vision isn't as good as it use to be s/p cataract removal.  Pt also interested in repeat sleep study.  Using CPAP nightly.  Last sleep study was 9 yrs ago.  Pt also notes feeling off balance x 6 months, decreased hearing and tinnitus.  Feels off balance when walking his dog, going down steps, or bending over. Pt denies dizziness.  Past Medical History:  Diagnosis Date  . Allergy   . Colon polyps 2016  . Depression   . GERD (gastroesophageal reflux disease)   . Heart murmur   . Hyperlipidemia   . IBS (irritable bowel syndrome)   . Kidney stones    stage 3   . Prostate cancer [facility]) 2003   sx 2003 and radation  . Sleep apnea    no cpap per pt  . Sleep apnea with use of continuous positive airway pressure (CPAP)     Allergies  Allergen Reactions  . Lipitor [Atorvastatin Calcium]     myalgias    ROS General: Denies fever, chills, night sweats, changes in weight, changes in appetite  +balance issues HEENT: Denies ear pain, changes in vision, rhinorrhea, sore throat  +HAs, tinnitus, snoring CV: Denies CP, palpitations, SOB, orthopnea Pulm: Denies SOB, cough, wheezing GI: Denies abdominal pain, nausea, vomiting, diarrhea, constipation GU: Denies dysuria, hematuria, frequency  +nocturia Msk: Denies muscle cramps,  joint pains Neuro: Denies weakness, numbness, tingling Skin: Denies rashes, bruising Psych: Denies depression, anxiety, hallucinations     Objective:    Blood pressure 120/70, pulse 68, temperature 98.1 F (36.7 C), temperature source Oral, weight 175 lb 6.4 oz (79.6 kg), SpO2 98 %.  Gen. Pleasant, well-nourished, in no distress, normal affect   HEENT: [place]/AT, face symmetric, conjunctiva clear, no scleral icterus, PERRLA, EOMI, nares patent without drainage, pharynx without erythema or exudate. Neck: No JVD, mild carotid bruits Lungs: no accessory muscle use, CTAB, no wheezes or rales Cardiovascular: RRR, no m/r/g, no peripheral edema Musculoskeletal: No deformities, no cyanosis or clubbing, normal tone Neuro:  A&Ox3, CN II-XII intact, normal gait Skin:  Warm, no lesions/ rash   Wt Readings from Last 3 Encounters:  08/08/20 175 lb 6.4 oz (79.6 kg)  04/18/20 172 lb 7 oz (78.2 kg)  02/10/20 171 lb 8 oz (77.8 kg)    Lab Results  Component Value Date   WBC 5.3 05/23/2020   HGB 12.7 (L) 05/23/2020   HCT 40.0 05/23/2020   PLT 268 05/23/2020   GLUCOSE 108 (H) 05/23/2020   CHOL 149 05/23/2020   TRIG 173 (H) 05/23/2020   HDL 47 05/23/2020   LDLCALC 75 05/23/2020   ALT 14 04/14/2019   AST 14 04/14/2019   NA 141 05/23/2020   K 4.1 05/23/2020  CL 107 05/23/2020   CREATININE 1.44 (H) 05/23/2020   BUN 24 05/23/2020   CO2 25 05/23/2020   TSH 0.65 04/14/2019   PSA 2.85 05/23/2020   HGBA1C 6.1 12/23/2017    Assessment/Plan:  Daily headache -Different from typical migraine -Discussed possible causes including dehydration, increased caffeine intake, decreased sleep, and worsening OSA -Pt increase p.o. intake of water -Consider headache diary -We will place referral for new sleep study -For continued or worsening symptoms consider referral to neurology -Discussed limiting use of Imitrex and NSAIDs as it can cause rebound headaches. -Discussed imaging based on labs - Plan:  PSG Sleep Study, CMP with eGFR([facility]), Magnesium, CBC with Differential/Platelet, TSH, T4, free  Balance problem -Discussed possible causes including dehydration, intercranial process, vertigo -Orthostatics normal -Discussed increasing p.o. intake of water especially with history of CKD -Consider imaging and PT for continued symptoms. - Plan: CMP with eGFR([facility]), CBC with Differential/Platelet, TSH, T4, free  Decreased hearing of both ears -Unable to hear any of the tones on hearing exam - Plan: Ambulatory referral to Audiology, CBC with Differential/Platelet  Snoring -Continue CPAP -We will place referral for new sleep study as settings likely need adjusting as last sleep study 9 years ago. - Plan: PSG Sleep Study  F/u as needed in 1 month, sooner if needed  Grier Mitts, MD

## 2020-08-15 ENCOUNTER — Encounter: Payer: Self-pay | Admitting: Family Medicine

## 2020-09-03 DIAGNOSIS — H903 Sensorineural hearing loss, bilateral: Secondary | ICD-10-CM | POA: Diagnosis not present

## 2020-09-03 DIAGNOSIS — H9313 Tinnitus, bilateral: Secondary | ICD-10-CM | POA: Diagnosis not present

## 2020-09-03 DIAGNOSIS — Z570 Occupational exposure to noise: Secondary | ICD-10-CM | POA: Diagnosis not present

## 2020-09-10 ENCOUNTER — Encounter: Payer: Self-pay | Admitting: Family Medicine

## 2020-09-10 ENCOUNTER — Telehealth: Payer: Medicare Other | Admitting: Family Medicine

## 2020-09-10 ENCOUNTER — Telehealth (INDEPENDENT_AMBULATORY_CARE_PROVIDER_SITE_OTHER): Payer: Medicare Other | Admitting: Family Medicine

## 2020-09-10 DIAGNOSIS — W009XXA Unspecified fall due to ice and snow, initial encounter: Secondary | ICD-10-CM

## 2020-09-10 DIAGNOSIS — M25522 Pain in left elbow: Secondary | ICD-10-CM | POA: Diagnosis not present

## 2020-09-10 DIAGNOSIS — M25552 Pain in left hip: Secondary | ICD-10-CM | POA: Diagnosis not present

## 2020-09-10 DIAGNOSIS — J0110 Acute frontal sinusitis, unspecified: Secondary | ICD-10-CM | POA: Diagnosis not present

## 2020-09-10 DIAGNOSIS — M25512 Pain in left shoulder: Secondary | ICD-10-CM | POA: Diagnosis not present

## 2020-09-10 MED ORDER — AMOXICILLIN-POT CLAVULANATE 500-125 MG PO TABS: 1.0000 | ORAL_TABLET | Freq: Two times a day (BID) | ORAL | 0 refills | Status: AC

## 2020-09-10 NOTE — Progress Notes (Signed)
Virtual Visit via Video Note  I connected with Luis Villegas on 09/10/20 at  3:00 PM EST by a video enabled telemedicine application 2/2 [id number]-[id number] pandemic and verified that I am speaking with the correct person using two identifiers.  Location patient: home Location provider:work or home office Persons participating in the virtual visit: patient, provider, pt's wife in and out of the room as well.  I discussed the limitations of evaluation and management by telemedicine and the availability of in person appointments. The patient expressed understanding and agreed to proceed.   HPI: Pt is a 75 year old male with past medical history significant for migraines, OSA, GERD, carpal tunnel, CKD 3, h/o prostate cancer, HLD, HSV, anxiety, anemia who was seen for follow-up and acute concern.  Pt started drinking more water and noticed improvement in daily HAs.  Pt's wife states she was surprised as he has always had a HA for as long as she has known him.  Pt notes HA returned x 1 wk, but feels like it is sinus related.  Having to blowing nose more than normal and has pressure in frontal area of head.  Pt fell this morning on 7 concrete steps and hurt his L shoulder after slipping on the ice.  Pt fell down the stairs on his L side.  Denies hitting head or LOC.  Tried icing shoulder and tylenol.  Has bruising on L elbow.  Unable to lift LUE.    Pt states he has fallen on the ice during the previous ice storms (2-3x).  [place] so there is always left over ice for several days.  Pt had a cortisone injection in L Hip with Dr. Marlou Sa in April 2021 for bursitis.  L hip hurting more s/p falls.  ROS: See pertinent positives and negatives per HPI.  Past Medical History:  Diagnosis Date  . Allergy   . Colon polyps 2016  . Depression   . GERD (gastroesophageal reflux disease)   . Heart murmur   . Hyperlipidemia   . IBS (irritable bowel syndrome)   . Kidney stones    stage 3   . Prostate cancer  [facility]) 2003   sx 2003 and radation  . Sleep apnea    no cpap per pt  . Sleep apnea with use of continuous positive airway pressure (CPAP)     Past Surgical History:  Procedure Laterality Date  . CARPAL TUNNEL RELEASE Bilateral   . CERVICAL FUSION    . COLONOSCOPY  2007, 08/09/2014   2007 [facility]/ 2016 [place]  . COLONOSCOPY W/ POLYPECTOMY  2002  . POLYPECTOMY  2016   polyps x3 T.A.  . PROSTATECTOMY  2003   Dr.Davis  . TONSILLECTOMY      Family History  Problem Relation Age of Onset  . Stroke Father 67  . Bladder Cancer Father   . Breast cancer Mother   . Diabetes Mother   . Neuropathy Mother   . Valvular heart disease Mother   . Heart attack Mother 70  . Prostate cancer Brother   . Heart disease Brother   . Heart attack Brother   . Prostate cancer Brother   . Colon cancer Neg Hx   . Colon polyps Neg Hx   . Esophageal cancer Neg Hx   . Stomach cancer Neg Hx   . Rectal cancer Neg Hx      Current Outpatient Medications:  .  acetaminophen (TYLENOL) 500 MG tablet, Take 500 mg by mouth every 6 (six) hours as  needed., Disp: , Rfl:  .  acyclovir (ZOVIRAX) 200 MG capsule, Take 1 capsule (200 mg total) by mouth daily., Disp: 90 capsule, Rfl: 3 .  buPROPion (WELLBUTRIN XL) 150 MG 24 hr tablet, Take 1 tablet (150 mg total) by mouth daily., Disp: 90 tablet, Rfl: 2 .  citalopram (CELEXA) 20 MG tablet, Take 1 tablet (20 mg total) by mouth daily., Disp: 90 tablet, Rfl: 3 .  ezetimibe-simvastatin (VYTORIN) 10-40 MG tablet, Take 1 tablet by mouth at bedtime., Disp: 90 tablet, Rfl: 3 .  Multiple Vitamin (MULTIVITAMIN ADULT PO), Take by mouth., Disp: , Rfl:  .  Omega-3 Fatty Acids (FISH OIL PO), Take by mouth., Disp: , Rfl:  .  omeprazole (PRILOSEC) 20 MG capsule, TAKE 1 CAPSULE BY MOUTH EVERY DAY, Disp: 90 capsule, Rfl: 0 .  solifenacin (VESICARE) 5 MG tablet, Take 5 mg by mouth daily., Disp: , Rfl:  .  SUMAtriptan (IMITREX) 100 MG tablet, TAKE 1 TABLET BY MOUTH AS NEEDED FOR  HEADACHE, Disp: 9 tablet, Rfl: 6 .  topiramate (TOPAMAX) 200 MG tablet, TAKE 1 TABLET(200 MG) BY MOUTH AT BEDTIME, Disp: 90 tablet, Rfl: 3 .  triamcinolone (KENALOG) 0.025 % cream, Apply 1 application topically 2 (two) times daily., Disp: 30 g, Rfl: 0  EXAM:  VITALS per patient if applicable:  RR between 12-20 bpm  GENERAL: alert, oriented, appears well and in no acute distress  HEENT: atraumatic, conjunctiva clear, no obvious abnormalities on inspection of external nose and ears  NECK: normal movements of the head and neck  LUNGS: on inspection no signs of respiratory distress, breathing rate appears normal, no obvious gross SOB, gasping or wheezing  CV: no obvious cyanosis  MS: moves all visible extremities without noticeable abnormality, LUE with limited active ROM at shoulder.  Slight increase in passive ROM, ~20 degrees, with movement on LUE by pt's wife.  Pt unable to pronate L wrist 2/2 pain.  Hesitant to extend forearm at L elbow.  PSYCH/NEURO: pleasant and cooperative, no obvious depression or anxiety, speech and thought processing grossly intact  ASSESSMENT AND PLAN:  Discussed the following assessment and plan:  Fall due to slipping on ice or snow, initial encounter -Discussed fall prevention -Consider PT for help with balance - Plan: DG Shoulder Left, DG Elbow Complete Left, DG Hip Unilat W OR W/O Pelvis 2-3 Views Left  Subacute frontal sinusitis  -Supportive care -We will start Augmentin twice daily x7 days -Consider saline nasal rinse or Flonase - Plan: amoxicillin-clavulanate (AUGMENTIN) 500-125 MG tablet  Acute pain of left shoulder -Discussed supportive care including Tylenol, rest, compression, topical analgesics -Continue ice then switch to heat tomorrow -Will obtain imaging tomorrow in clinic. -Further recommendations based on imaging - Plan: DG Shoulder Left  Left hip pain -Supportive care as discussed above -Further recommendations based on  imaging - Plan: DG Hip Unilat W OR W/O Pelvis 2-3 Views Left  Left elbow pain -Supportive care as discussed above -Further recommendations based on imaging  - Plan: DG Elbow Complete Left     I discussed the assessment and treatment plan with the patient. The patient was provided an opportunity to ask questions and all were answered. The patient agreed with the plan and demonstrated an understanding of the instructions.   The patient was advised to call back or seek an in-person evaluation if the symptoms worsen or if the condition fails to improve as anticipated.  I provided 20 minutes of non-face-to-face time during this encounter.   Langley Adie  Volanda Napoleon, MD

## 2020-09-11 ENCOUNTER — Ambulatory Visit (INDEPENDENT_AMBULATORY_CARE_PROVIDER_SITE_OTHER): Payer: Medicare Other

## 2020-09-11 ENCOUNTER — Other Ambulatory Visit: Payer: Self-pay

## 2020-09-11 ENCOUNTER — Other Ambulatory Visit: Payer: Medicare Other

## 2020-09-11 DIAGNOSIS — W009XXA Unspecified fall due to ice and snow, initial encounter: Secondary | ICD-10-CM

## 2020-09-11 DIAGNOSIS — M25552 Pain in left hip: Secondary | ICD-10-CM

## 2020-09-11 DIAGNOSIS — M25512 Pain in left shoulder: Secondary | ICD-10-CM | POA: Diagnosis not present

## 2020-09-11 DIAGNOSIS — M25522 Pain in left elbow: Secondary | ICD-10-CM

## 2020-09-12 ENCOUNTER — Ambulatory Visit: Payer: Medicare Other | Admitting: Dermatology

## 2020-09-13 ENCOUNTER — Encounter (HOSPITAL_BASED_OUTPATIENT_CLINIC_OR_DEPARTMENT_OTHER): Payer: Medicare Other | Admitting: Internal Medicine

## 2020-10-05 ENCOUNTER — Ambulatory Visit (INDEPENDENT_AMBULATORY_CARE_PROVIDER_SITE_OTHER): Payer: Medicare Other | Admitting: Orthopedic Surgery

## 2020-10-05 ENCOUNTER — Ambulatory Visit: Payer: Self-pay

## 2020-10-05 DIAGNOSIS — M25512 Pain in left shoulder: Secondary | ICD-10-CM

## 2020-10-05 DIAGNOSIS — M65332 Trigger finger, left middle finger: Secondary | ICD-10-CM | POA: Diagnosis not present

## 2020-10-06 ENCOUNTER — Encounter: Payer: Self-pay | Admitting: Orthopedic Surgery

## 2020-10-07 ENCOUNTER — Encounter: Payer: Self-pay | Admitting: Orthopedic Surgery

## 2020-10-07 ENCOUNTER — Encounter (HOSPITAL_BASED_OUTPATIENT_CLINIC_OR_DEPARTMENT_OTHER): Payer: Medicare Other | Admitting: Internal Medicine

## 2020-10-07 DIAGNOSIS — M65332 Trigger finger, left middle finger: Secondary | ICD-10-CM

## 2020-10-07 MED ORDER — METHYLPREDNISOLONE ACETATE 40 MG/ML IJ SUSP
13.3300 mg | INTRAMUSCULAR | Status: AC | PRN
  Administered 2020-10-07: 13.33 mg

## 2020-10-07 MED ORDER — BUPIVACAINE HCL 0.25 % IJ SOLN
0.3300 mL | INTRAMUSCULAR | Status: AC | PRN
  Administered 2020-10-07: .33 mL

## 2020-10-07 MED ORDER — LIDOCAINE HCL 1 % IJ SOLN
3.0000 mL | INTRAMUSCULAR | Status: AC | PRN
  Administered 2020-10-07: 3 mL

## 2020-10-07 NOTE — Progress Notes (Signed)
Office Visit Note   Patient: Luis Villegas           Date of Birth: [birth year]           MRN: [id number] Visit Date: 10/05/2020 Requested by: Luis Ruddy, MD Luis Villegas,  [place] [id number] PCP: Luis Ruddy, MD  Subjective: Chief Complaint  Patient presents with  . Left Shoulder - Pain    HPI: Luis Villegas is a 75 y.o. male who presents to the office complaining of left shoulder pain and left middle finger pain.  Patient had a fall on 09/10/2020 when he fell down 8 steps due to ice.  He complains of left shoulder pain that he localizes to the lateral aspect of the left shoulder.  He has difficulty with abduction in his left shoulder due to weakness and pain.  He wakes at night with pain multiple times per night and he cannot lay on his left side.  He has to sleep in a recliner at night at times due to the pain.  Denies any shoulder blade pain, neck pain, numbness/tingling, radicular pain.  No history of left shoulder surgery.  He does have history of prior right shoulder surgery in 2003 that sounds like biceps tenodesis by his description.  He is taking Tylenol as needed for pain control.  Also using a TENS unit and heat which has helped.  Additionally he complains of left middle finger pain and triggering that he has noticed since the fall.  He has had other trigger fingers before but never affected his left middle finger.  No significant bruising or deformity to the finger.  Symptoms are worst in the morning when he wakes up and the finger is stuck in a flexed position.              ROS: All systems reviewed are negative as they relate to the chief complaint within the history of present illness.  Patient denies fevers or chills.  Assessment & Plan: Visit Diagnoses:  1. Trigger finger, left middle finger   2. Left shoulder pain, unspecified chronicity     Plan: Patient is a 75 year old male who presents complaining of left shoulder pain  and left middle finger triggering.  He has had significant left shoulder pain that wakes him up at night multiple times per night since a fall down 8 steps in early February.  He does have some weakness on exam.  Left shoulder radiographs ordered by his primary care physician on 09/11/2020 were reviewed and no significant findings were noted on these radiographs.  With no significant improvement in pain and pain that wakes him up at night as well as weakness on exam, plan to obtain MRI arthrogram of the left shoulder to evaluate for rotator cuff tear.  Additionally, patient is dealing with a left middle finger trigger finger.  This is causing discomfort throughout his daily life and worst in the morning.  He would like to have this injected today.  Under ultrasound guidance, cortisone injection was delivered successfully into the tendon sheath around the A1 pulley.  Patient tolerated the procedure well.  Plan to follow-up after MRI to review results.  Follow-Up Instructions: No follow-ups on file.   Orders:  Orders Placed This Encounter  Procedures  . US Guided Needle Placement - No Linked Charges  . MR Shoulder Left w/ contrast  . Arthrogram   No orders of the defined types were placed in this encounter.  Procedures: Hand/UE Inj: L long A1 for trigger finger on 10/07/2020 2:50 PM Indications: therapeutic Details: 25 G needle, volar approach Medications: 0.33 mL bupivacaine 0.25 %; 13.33 mg methylPREDNISolone acetate 40 MG/ML; 3 mL lidocaine 1 % Outcome: tolerated well, no immediate complications Procedure, treatment alternatives, risks and benefits explained, specific risks discussed. Consent was given by the patient. Immediately prior to procedure a time out was called to verify the correct patient, procedure, equipment, support staff and site/side marked as required. Patient was prepped and draped in the usual sterile fashion.       Clinical Data: No additional  findings.  Objective: Vital Signs: There were no vitals taken for this visit.  Physical Exam:  Constitutional: Patient appears well-developed HEENT:  Head: Normocephalic Eyes:EOM are normal Neck: Normal range of motion Cardiovascular: Normal rate Pulmonary/chest: Effort normal Neurologic: Patient is alert Skin: Skin is warm Psychiatric: Patient has normal mood and affect  Ortho Exam: Ortho exam demonstrates left shoulder with 45 degrees external rotation, 70 degrees abduction, 140 degrees forward flexion.  This compared with the right shoulder with 45 degrees external rotation, 85 degrees abduction, 150 degrees forward flexion.  Excellent strength of supraspinatus and subscapularis of the left shoulder.  He does have some weakness of the left shoulder infraspinatus compared with the contralateral side.  No tenderness of the AC joint.  No pain with crossarm adduction.  Negative Hornblower sign.  Negative drop arm test.  Positive Neer and Hawkins impingement signs.  Tenderness over the A1 pulley of the left middle finger.  Triggering observed that he is able to reduce with the left hand itself and does not need to use the contralateral hand to reduce.  Specialty Comments:  No specialty comments available.  Imaging: No results found.   PMFS History: Patient Active Problem List   Diagnosis Date Noted  . 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 Chronic kidney disease (CKD), stage III (moderate) ([place]) 04/15/2019  . VENTRAL HERNIA 11/23/2008  . COLONIC POLYPS, HX OF 11/23/2008  . HYPERLIPIDEMIA 11/11/2007  . CARPAL TUNNEL SYNDROME 11/11/2007  . GERD 11/11/2007  . SLEEP APNEA 11/11/2007  . PROSTATE CANCER, HX OF 11/11/2007  . ANXIETY STATE NOS 05/18/2007  . MIGRAINE NEC W/INTRACTABLE MIGRAINE 03/23/2007  . ANEMIA-NOS 09/23/2006   Past Medical History:  Diagnosis Date  . Allergy   . Colon polyps 2016  . Depression   . GERD (gastroesophageal reflux disease)   . Heart murmur   .  Hyperlipidemia   . IBS (irritable bowel syndrome)   . Kidney stones    stage 3   . Prostate cancer [facility]) 2003   sx 2003 and radation  . Sleep apnea    no cpap per pt  . Sleep apnea with use of continuous positive airway pressure (CPAP)     Family History  Problem Relation Age of Onset  . Stroke Father 35  . Bladder Cancer Father   . Breast cancer Mother   . Diabetes Mother   . Neuropathy Mother   . Valvular heart disease Mother   . Heart attack Mother 11  . Prostate cancer Brother   . Heart disease Brother   . Heart attack Brother   . Prostate cancer Brother   . Colon cancer Neg Hx   . Colon polyps Neg Hx   . Esophageal cancer Neg Hx   . Stomach cancer Neg Hx   . Rectal cancer Neg Hx     Past Surgical History:  Procedure Laterality Date  . CARPAL  TUNNEL RELEASE Bilateral   . CERVICAL FUSION    . COLONOSCOPY  2007, 08/09/2014   2007 [facility]/ 2016 [place]  . COLONOSCOPY W/ POLYPECTOMY  2002  . POLYPECTOMY  2016   polyps x3 T.A.  . PROSTATECTOMY  2003   Dr.Davis  . TONSILLECTOMY     Social History   Occupational History  . Occupation: Retired  Tobacco Use  . Smoking status: Former Smoker    Quit date: 08/04/1993    Years since quitting: 27.1  . Smokeless tobacco: Never Used  Vaping Use  . Vaping Use: Never used  Substance and Sexual Activity  . Alcohol use: No  . Drug use: No  . Sexual activity: Not on file

## 2020-10-08 NOTE — Telephone Encounter (Signed)
Maintain range of motion

## 2020-10-10 ENCOUNTER — Other Ambulatory Visit: Payer: Self-pay | Admitting: Family Medicine

## 2020-10-15 ENCOUNTER — Ambulatory Visit (INDEPENDENT_AMBULATORY_CARE_PROVIDER_SITE_OTHER): Payer: Medicare Other | Admitting: Sports Medicine

## 2020-10-15 ENCOUNTER — Other Ambulatory Visit: Payer: Self-pay

## 2020-10-15 ENCOUNTER — Ambulatory Visit (INDEPENDENT_AMBULATORY_CARE_PROVIDER_SITE_OTHER): Payer: Medicare Other

## 2020-10-15 DIAGNOSIS — G8929 Other chronic pain: Secondary | ICD-10-CM | POA: Insufficient documentation

## 2020-10-15 DIAGNOSIS — M75122 Complete rotator cuff tear or rupture of left shoulder, not specified as traumatic: Secondary | ICD-10-CM | POA: Diagnosis not present

## 2020-10-15 DIAGNOSIS — M25512 Pain in left shoulder: Secondary | ICD-10-CM

## 2020-10-15 DIAGNOSIS — M19012 Primary osteoarthritis, left shoulder: Secondary | ICD-10-CM | POA: Diagnosis not present

## 2020-10-15 NOTE — Progress Notes (Signed)
    Procedures performed today:    Procedure: Real-time Ultrasound Guided gadolinium contrast injection of left glenohumeral joint Device: Samsung HS60  Verbal informed consent obtained.  Time-out conducted.  Noted no overlying erythema, induration, or other signs of local infection.  Skin prepped in a sterile fashion.  Local anesthesia: Topical Ethyl chloride.  With sterile technique and under real time ultrasound guidance: Noted minimal degenerative changes and abnormal motion in the posterior labrum that may represent a tear. I advanced a 22-gauge spinal needle into the glenohumeral joint from posterior approach, I then injected 1 cc kenalog 40, 2 cc lidocaine, 2 cc bupivacaine, syringe switched and 0.1 cc gadolinium injected, syringe again switched and 10 cc sterile saline used to fully distend the joint. Joint visualized and capsule seen distending confirming intra-articular placement of contrast material and medication. Completed without difficulty  Advised to call if fevers/chills, erythema, induration, drainage, or persistent bleeding.  Images permanently stored in PACS Impression: Technically successful ultrasound guided gadolinium contrast injection for MR arthrography.  Please see separate MR arthrogram report.   Independent interpretation of notes and tests performed by another provider:   None.  Brief History, Exam, Impression, and Recommendations:    Chronic left shoulder pain Ultrasound-guided arthrography performed today, further management per primary treating provider.   ___________________________________________ Gwen Her. Dianah Field, M.D., ABFM., CAQSM. Primary Care and [place] Instructor of [employer] [facility] [employer] [facility]

## 2020-10-15 NOTE — Assessment & Plan Note (Signed)
Ultrasound-guided arthrography performed today, further management per primary treating provider.

## 2020-10-16 DIAGNOSIS — R040 Epistaxis: Secondary | ICD-10-CM | POA: Diagnosis not present

## 2020-10-22 ENCOUNTER — Ambulatory Visit (INDEPENDENT_AMBULATORY_CARE_PROVIDER_SITE_OTHER): Payer: Medicare Other | Admitting: Orthopedic Surgery

## 2020-10-22 ENCOUNTER — Encounter: Payer: Self-pay | Admitting: Orthopedic Surgery

## 2020-10-22 ENCOUNTER — Other Ambulatory Visit: Payer: Self-pay

## 2020-10-22 DIAGNOSIS — S46812A Strain of other muscles, fascia and tendons at shoulder and upper arm level, left arm, initial encounter: Secondary | ICD-10-CM | POA: Diagnosis not present

## 2020-10-22 NOTE — Progress Notes (Signed)
Office Visit Note   Patient: Luis Villegas           Date of Birth: May 17, 1946           MRN: [id number] Visit Date: 10/22/2020 Requested by: Billie Ruddy, MD [place],  [place] [id number] PCP: Billie Ruddy, MD  Subjective: Chief Complaint  Patient presents with  . Other     Scan review    HPI: Luis Villegas is a 75 y.o. male who presents to the office complaining of left shoulder pain.  Patient returns to discuss MRI results of left shoulder.  MRI revealed full-thickness tear of the supraspinatus tendon with 1.5 cm to the 2.5 cm retraction.  Full-thickness incomplete tear of the upper subscapularis as well.  Patient states that over the last 10 days he has had significant improvement in pain.  He no longer wakes with pain.  He is able to lift his arm up over his head without difficulty and with no severe subjective weakness.  Patient also reports that his trigger finger has significantly improved following the injection at previous office visit.  He has not had an injection in the left shoulder before.              ROS: All systems reviewed are negative as they relate to the chief complaint within the history of present illness.  Patient denies fevers or chills.  Assessment & Plan: Visit Diagnoses:  1. Traumatic tear of supraspinatus tendon of left shoulder, initial encounter     Plan: Patient is a 75 year old male who presents complaining of left shoulder pain.  He is here to review MRI scan of left shoulder.  He has a retracted supraspinatus tear by about 1.5 to 2.5 cm that is responsible for the weakness on examination today and at his past visit.  However, he has had significant improvement in pain over the last 10 days to the point where he would like to avoid surgical intervention if possible.  Given his excellent active motion to the point where he can lift his arm over his head easily and his lack of significant pain, plan for  patient to return to normal activities as he can tolerate.  If pain does return, recommended patient follow-up with the office to discuss further options.  Patient agreed with this plan.  He understands that there is a high chance that his symptoms will return.  In general the patient is asymptomatic at this time.  We encouraged him to play golf and do the activities that he wants to do to see if it becomes symptomatic.  At this point it is still repairable.  Probably after another 12 to 18 months it may become unrepairable.  We will see him back on an as-needed basis if his symptoms recur.  Follow-Up Instructions: No follow-ups on file.   Orders:  No orders of the defined types were placed in this encounter.  No orders of the defined types were placed in this encounter.     Procedures: No procedures performed   Clinical Data: No additional findings.  Objective: Vital Signs: There were no vitals taken for this visit.  Physical Exam:  Constitutional: Patient appears well-developed HEENT:  Head: Normocephalic Eyes:EOM are normal Neck: Normal range of motion Cardiovascular: Normal rate Pulmonary/chest: Effort normal Neurologic: Patient is alert Skin: Skin is warm Psychiatric: Patient has normal mood and affect  Ortho Exam: Ortho exam demonstrates left shoulder with passive external rotation to 45,  passive abduction to 80 degrees, 150 degrees forward flexion.  Active range of motion of the left shoulder is equal to passive motion.  Continued weakness of external rotation of the left shoulder consistent with previous examination.  Abduction strength is equivalent to the contralateral arm.  Equivalent subscapularis strength to the contralateral arm.  Mild tenderness over the [facility] joint.  Specialty Comments:  No specialty comments available.  Imaging: No results found.   PMFS History: Patient Active Problem List   Diagnosis Date Noted  . Chronic left shoulder pain 10/15/2020  .  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 Chronic kidney disease (CKD), stage III (moderate) ([place]) 04/15/2019  . VENTRAL HERNIA 11/23/2008  . COLONIC POLYPS, HX OF 11/23/2008  . HYPERLIPIDEMIA 11/11/2007  . CARPAL TUNNEL SYNDROME 11/11/2007  . GERD 11/11/2007  . SLEEP APNEA 11/11/2007  . PROSTATE CANCER, HX OF 11/11/2007  . ANXIETY STATE NOS 05/18/2007  . MIGRAINE NEC W/INTRACTABLE MIGRAINE 03/23/2007  . ANEMIA-NOS 09/23/2006   Past Medical History:  Diagnosis Date  . Allergy   . Colon polyps 2016  . Depression   . GERD (gastroesophageal reflux disease)   . Heart murmur   . Hyperlipidemia   . IBS (irritable bowel syndrome)   . Kidney stones    stage 3   . Prostate cancer [facility]) 2003   sx 2003 and radation  . Sleep apnea    no cpap per pt  . Sleep apnea with use of continuous positive airway pressure (CPAP)     Family History  Problem Relation Age of Onset  . Stroke Father 43  . Bladder Cancer Father   . Breast cancer Mother   . Diabetes Mother   . Neuropathy Mother   . Valvular heart disease Mother   . Heart attack Mother 54  . Prostate cancer Brother   . Heart disease Brother   . Heart attack Brother   . Prostate cancer Brother   . Colon cancer Neg Hx   . Colon polyps Neg Hx   . Esophageal cancer Neg Hx   . Stomach cancer Neg Hx   . Rectal cancer Neg Hx     Past Surgical History:  Procedure Laterality Date  . CARPAL TUNNEL RELEASE Bilateral   . CERVICAL FUSION    . COLONOSCOPY  2007, 08/09/2014   2007 [facility]/ 2016 [place]  . COLONOSCOPY W/ POLYPECTOMY  2002  . POLYPECTOMY  2016   polyps x3 T.A.  . PROSTATECTOMY  2003   Dr.Davis  . TONSILLECTOMY     Social History   Occupational History  . Occupation: Retired  Tobacco Use  . Smoking status: Former Smoker    Quit date: 08/04/1993    Years since quitting: 27.2  . Smokeless tobacco: Never Used  Vaping Use  . Vaping Use: Never used  Substance and Sexual Activity  . Alcohol use: No  . Drug use: No  . Sexual  activity: Not on file

## 2020-10-26 DIAGNOSIS — C61 Malignant neoplasm of prostate: Secondary | ICD-10-CM | POA: Diagnosis not present

## 2020-10-26 LAB — PSA: PSA: 2.95

## 2020-10-31 DIAGNOSIS — R3915 Urgency of urination: Secondary | ICD-10-CM | POA: Diagnosis not present

## 2020-10-31 DIAGNOSIS — R3912 Poor urinary stream: Secondary | ICD-10-CM | POA: Diagnosis not present

## 2020-10-31 DIAGNOSIS — Z8549 Personal history of malignant neoplasm of other male genital organs: Secondary | ICD-10-CM | POA: Diagnosis not present

## 2020-10-31 DIAGNOSIS — R9721 Rising PSA following treatment for malignant neoplasm of prostate: Secondary | ICD-10-CM | POA: Diagnosis not present

## 2020-10-31 DIAGNOSIS — N200 Calculus of kidney: Secondary | ICD-10-CM | POA: Diagnosis not present

## 2020-10-31 DIAGNOSIS — N3941 Urge incontinence: Secondary | ICD-10-CM | POA: Diagnosis not present

## 2020-10-31 DIAGNOSIS — C61 Malignant neoplasm of prostate: Secondary | ICD-10-CM | POA: Diagnosis not present

## 2020-11-02 ENCOUNTER — Other Ambulatory Visit: Payer: Medicare Other

## 2020-11-02 ENCOUNTER — Other Ambulatory Visit (HOSPITAL_COMMUNITY): Payer: Self-pay | Admitting: Urology

## 2020-11-02 DIAGNOSIS — R9721 Rising PSA following treatment for malignant neoplasm of prostate: Secondary | ICD-10-CM

## 2020-11-02 DIAGNOSIS — C61 Malignant neoplasm of prostate: Secondary | ICD-10-CM

## 2020-11-16 ENCOUNTER — Ambulatory Visit (HOSPITAL_COMMUNITY)
Admission: RE | Admit: 2020-11-16 | Discharge: 2020-11-16 | Disposition: A | Discharge status: Home / Self Care | Payer: Medicare Other | Source: Ambulatory Visit | Attending: Urology | Admitting: Urology

## 2020-11-16 ENCOUNTER — Other Ambulatory Visit: Payer: Self-pay

## 2020-11-16 DIAGNOSIS — C61 Malignant neoplasm of prostate: Secondary | ICD-10-CM | POA: Diagnosis not present

## 2020-11-16 DIAGNOSIS — R9721 Rising PSA following treatment for malignant neoplasm of prostate: Secondary | ICD-10-CM | POA: Insufficient documentation

## 2020-11-19 ENCOUNTER — Encounter: Payer: Self-pay | Admitting: Family Medicine

## 2020-11-22 ENCOUNTER — Other Ambulatory Visit: Payer: Self-pay | Admitting: Family Medicine

## 2020-11-28 ENCOUNTER — Ambulatory Visit (INDEPENDENT_AMBULATORY_CARE_PROVIDER_SITE_OTHER): Payer: Medicare Other | Admitting: Dermatology

## 2020-11-28 ENCOUNTER — Encounter: Payer: Self-pay | Admitting: Dermatology

## 2020-11-28 ENCOUNTER — Other Ambulatory Visit: Payer: Self-pay

## 2020-11-28 DIAGNOSIS — Z1283 Encounter for screening for malignant neoplasm of skin: Secondary | ICD-10-CM

## 2020-11-28 DIAGNOSIS — L821 Other seborrheic keratosis: Secondary | ICD-10-CM

## 2020-11-28 DIAGNOSIS — L570 Actinic keratosis: Secondary | ICD-10-CM

## 2020-11-28 DIAGNOSIS — L820 Inflamed seborrheic keratosis: Secondary | ICD-10-CM | POA: Diagnosis not present

## 2020-11-29 ENCOUNTER — Ambulatory Visit: Payer: Medicare Other | Admitting: Orthopedic Surgery

## 2020-12-09 ENCOUNTER — Encounter: Payer: Self-pay | Admitting: Dermatology

## 2020-12-09 NOTE — Progress Notes (Signed)
   Follow-Up Visit   Subjective  Luis Villegas is a 75 y.o. male who presents for the following: Skin Problem (Wants brown spots removed around neck).  General skin examination, crusty spots around neck Location:  Duration:  Quality:  Associated Signs/Symptoms: Modifying Factors:  Severity:  Timing: Context:   Objective  Well appearing patient in no apparent distress; mood and affect are within normal limits. Objective  Neck - Anterior: Inflamed flattopped pink 6 mm crust  Objective  Neck - Anterior: Horn-like for millimeter crust  Objective  Left outer knee: 2cm monochrome subtly textured tan macule  Images    Objective  Mid Back: General skin examination, no atypical pigmented spots    A focused examination was performed including Head, neck, torso, legs.. Relevant physical exam findings are noted in the Assessment and Plan.   Assessment & Plan    Inflamed seborrheic keratosis Neck - Anterior  Destruction of lesion - Neck - Anterior Complexity: simple   Destruction method: cryotherapy   Informed consent: discussed and consent obtained   Timeout:  patient name, date of birth, surgical site, and procedure verified Lesion destroyed using liquid nitrogen: Yes   Cryotherapy cycles:  1 Outcome: patient tolerated procedure well with no complications   Post-procedure details: wound care instructions given    AK (actinic keratosis) Neck - Anterior  Destruction of lesion - Neck - Anterior Complexity: simple   Destruction method: cryotherapy   Informed consent: discussed and consent obtained   Timeout:  patient name, date of birth, surgical site, and procedure verified Lesion destroyed using liquid nitrogen: Yes   Cryotherapy cycles:  3 Outcome: patient tolerated procedure well with no complications   Post-procedure details: wound care instructions given    Seborrheic keratosis Left outer knee  Leave if stable  Encounter for screening for  malignant neoplasm of skin Mid Back  Annual skin examination.      I, Lavonna Monarch, MD, have reviewed all documentation for this visit.  The documentation on 12/09/20 for the exam, diagnosis, procedures, and orders are all accurate and complete.

## 2020-12-14 DIAGNOSIS — J310 Chronic rhinitis: Secondary | ICD-10-CM | POA: Diagnosis not present

## 2020-12-14 DIAGNOSIS — H903 Sensorineural hearing loss, bilateral: Secondary | ICD-10-CM | POA: Diagnosis not present

## 2020-12-14 DIAGNOSIS — H838X3 Other specified diseases of inner ear, bilateral: Secondary | ICD-10-CM | POA: Diagnosis not present

## 2020-12-14 DIAGNOSIS — R040 Epistaxis: Secondary | ICD-10-CM | POA: Diagnosis not present

## 2020-12-26 DIAGNOSIS — R3915 Urgency of urination: Secondary | ICD-10-CM | POA: Diagnosis not present

## 2020-12-26 DIAGNOSIS — N320 Bladder-neck obstruction: Secondary | ICD-10-CM | POA: Diagnosis not present

## 2020-12-26 DIAGNOSIS — R3912 Poor urinary stream: Secondary | ICD-10-CM | POA: Diagnosis not present

## 2020-12-26 DIAGNOSIS — C61 Malignant neoplasm of prostate: Secondary | ICD-10-CM | POA: Diagnosis not present

## 2020-12-26 DIAGNOSIS — N200 Calculus of kidney: Secondary | ICD-10-CM | POA: Diagnosis not present

## 2020-12-26 DIAGNOSIS — N3040 Irradiation cystitis without hematuria: Secondary | ICD-10-CM | POA: Diagnosis not present

## 2021-01-02 ENCOUNTER — Ambulatory Visit (INDEPENDENT_AMBULATORY_CARE_PROVIDER_SITE_OTHER): Payer: Medicare Other | Admitting: Orthopedic Surgery

## 2021-01-02 DIAGNOSIS — S46812A Strain of other muscles, fascia and tendons at shoulder and upper arm level, left arm, initial encounter: Secondary | ICD-10-CM | POA: Diagnosis not present

## 2021-01-02 DIAGNOSIS — M65332 Trigger finger, left middle finger: Secondary | ICD-10-CM | POA: Diagnosis not present

## 2021-01-03 DIAGNOSIS — R040 Epistaxis: Secondary | ICD-10-CM | POA: Diagnosis not present

## 2021-01-04 ENCOUNTER — Encounter: Payer: Self-pay | Admitting: Family Medicine

## 2021-01-04 ENCOUNTER — Encounter: Payer: Self-pay | Admitting: Orthopedic Surgery

## 2021-01-04 NOTE — Progress Notes (Signed)
Office Visit Note   Patient: Luis Villegas           Date of Birth: 1945/10/18           MRN: [id number] Visit Date: 01/02/2021 Requested by: Billie Ruddy, MD [place],  [place] [id number] PCP: Billie Ruddy, MD  Subjective: Chief Complaint  Patient presents with  . Left Shoulder - Pain    HPI: Luis Villegas is a 75 year old patient with left shoulder pain.  He has a known rotator cuff tear which is retracted about 1/2 to 2-1/2 cm.  Had some improvement in his shoulder pain after last clinic visit but now the pain has recurred and he is also reports significant left middle finger triggering.  He would like to play golf and like to be more active but the shoulder pain and weakness is preventing that.  Would like to have operative intervention on the shoulder for treatment.              ROS: All systems reviewed are negative as they relate to the chief complaint within the history of present illness.  Patient denies  fevers or chills.   Assessment & Plan: Visit Diagnoses:  1. Traumatic tear of supraspinatus tendon of left shoulder, initial encounter   2. Trigger finger, left middle finger     Plan: Impression is left shoulder rotator cuff tear with retraction to the top of the humeral head.  Had a discrete injury.  Also has triggering of the middle finger.  Plan at this time is arthroscopy with mini open rotator cuff tear repair and biceps tenodesis.  Also trigger finger release at the same time.  Risk and benefits are discussed including not limited to infection nerve vessel damage incomplete restoration of function and incomplete pain relief in that left shoulder.  Extensive nature of the rehabilitative process is also discussed.  All questions answered.  Follow-Up Instructions: No follow-ups on file.   Orders:  No orders of the defined types were placed in this encounter.  No orders of the defined types were placed in this encounter.      Procedures: No procedures performed   Clinical Data: No additional findings.  Objective: Vital Signs: There were no vitals taken for this visit.  Physical Exam:   Constitutional: Patient appears well-developed HEENT:  Head: Normocephalic Eyes:EOM are normal Neck: Normal range of motion Cardiovascular: Normal rate Pulmonary/chest: Effort normal Neurologic: Patient is alert Skin: Skin is warm Psychiatric: Patient has normal mood and affect    Ortho Exam: Ortho exam demonstrates some weakness to supraspinatus testing on the left compared to right.  Passive range of motion is 45/95/170 on the left.  Does have a little bit of coarseness with active and passive range of motion at 90 degrees of abduction.  No discrete tenderness AC joint.  Negative apprehension relocation testing.  Specialty Comments:  No specialty comments available.  Imaging: No results found.   PMFS History: Patient Active Problem List   Diagnosis Date Noted  . Chronic left shoulder pain 10/15/2020  . 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 Chronic kidney disease (CKD), stage III (moderate) ([place]) 04/15/2019  . VENTRAL HERNIA 11/23/2008  . COLONIC POLYPS, HX OF 11/23/2008  . HYPERLIPIDEMIA 11/11/2007  . CARPAL TUNNEL SYNDROME 11/11/2007  . GERD 11/11/2007  . SLEEP APNEA 11/11/2007  . PROSTATE CANCER, HX OF 11/11/2007  . ANXIETY STATE NOS 05/18/2007  . MIGRAINE NEC W/INTRACTABLE MIGRAINE 03/23/2007  . ANEMIA-NOS 09/23/2006   Past  Medical History:  Diagnosis Date  . Allergy   . Colon polyps 2016  . Depression   . GERD (gastroesophageal reflux disease)   . Heart murmur   . Hyperlipidemia   . IBS (irritable bowel syndrome)   . Kidney stones    stage 3   . Prostate cancer [facility]) 2003   sx 2003 and radation  . Sleep apnea    no cpap per pt  . Sleep apnea with use of continuous positive airway pressure (CPAP)     Family History  Problem Relation Age of Onset  . Stroke Father 73  . Bladder Cancer Father    . Breast cancer Mother   . Diabetes Mother   . Neuropathy Mother   . Valvular heart disease Mother   . Heart attack Mother 59  . Prostate cancer Brother   . Heart disease Brother   . Heart attack Brother   . Prostate cancer Brother   . Colon cancer Neg Hx   . Colon polyps Neg Hx   . Esophageal cancer Neg Hx   . Stomach cancer Neg Hx   . Rectal cancer Neg Hx     Past Surgical History:  Procedure Laterality Date  . CARPAL TUNNEL RELEASE Bilateral   . CERVICAL FUSION    . COLONOSCOPY  2007, 08/09/2014   2007 [facility]/ 2016 [place]  . COLONOSCOPY W/ POLYPECTOMY  2002  . POLYPECTOMY  2016   polyps x3 T.A.  . PROSTATECTOMY  2003   Dr.Davis  . TONSILLECTOMY     Social History   Occupational History  . Occupation: Retired  Tobacco Use  . Smoking status: Former Smoker    Quit date: 08/04/1993    Years since quitting: 27.4  . Smokeless tobacco: Never Used  Vaping Use  . Vaping Use: Never used  Substance and Sexual Activity  . Alcohol use: No  . Drug use: No  . Sexual activity: Not on file

## 2021-01-08 ENCOUNTER — Other Ambulatory Visit: Payer: Self-pay | Admitting: Family Medicine

## 2021-01-14 ENCOUNTER — Encounter: Payer: Self-pay | Admitting: Orthopedic Surgery

## 2021-01-14 ENCOUNTER — Other Ambulatory Visit: Payer: Self-pay | Admitting: Surgical

## 2021-01-14 DIAGNOSIS — M659 Synovitis and tenosynovitis, unspecified: Secondary | ICD-10-CM | POA: Diagnosis not present

## 2021-01-14 DIAGNOSIS — M75122 Complete rotator cuff tear or rupture of left shoulder, not specified as traumatic: Secondary | ICD-10-CM | POA: Diagnosis not present

## 2021-01-14 DIAGNOSIS — M7522 Bicipital tendinitis, left shoulder: Secondary | ICD-10-CM | POA: Diagnosis not present

## 2021-01-14 DIAGNOSIS — G8918 Other acute postprocedural pain: Secondary | ICD-10-CM | POA: Diagnosis not present

## 2021-01-14 DIAGNOSIS — M65332 Trigger finger, left middle finger: Secondary | ICD-10-CM | POA: Diagnosis not present

## 2021-01-14 MED ORDER — OXYCODONE-ACETAMINOPHEN 5-325 MG PO TABS: 1.0000 | ORAL_TABLET | ORAL | 0 refills | Status: DC | PRN

## 2021-01-14 MED ORDER — GABAPENTIN 600 MG PO TABS: 300.0000 mg | ORAL_TABLET | Freq: Three times a day (TID) | ORAL | 0 refills | Status: DC

## 2021-01-14 MED ORDER — METHOCARBAMOL 500 MG PO TABS: 500.0000 mg | ORAL_TABLET | Freq: Three times a day (TID) | ORAL | 0 refills | Status: DC | PRN

## 2021-01-15 ENCOUNTER — Telehealth: Payer: Self-pay | Admitting: Orthopedic Surgery

## 2021-01-15 NOTE — Telephone Encounter (Signed)
Luis Villegas pt wife called. She states that under the ace bandage on pt hand, there is something wrapped under it and she is wondering does she take that off as well as the ace bandage?  CB [phone]

## 2021-01-15 NOTE — Telephone Encounter (Signed)
I called.

## 2021-01-21 ENCOUNTER — Ambulatory Visit (INDEPENDENT_AMBULATORY_CARE_PROVIDER_SITE_OTHER): Payer: Medicare Other | Admitting: Orthopedic Surgery

## 2021-01-21 ENCOUNTER — Encounter: Payer: Self-pay | Admitting: Orthopedic Surgery

## 2021-01-21 DIAGNOSIS — S46812A Strain of other muscles, fascia and tendons at shoulder and upper arm level, left arm, initial encounter: Secondary | ICD-10-CM

## 2021-01-21 MED ORDER — HYDROCODONE-ACETAMINOPHEN 5-325 MG PO TABS: 1.0000 | ORAL_TABLET | Freq: Two times a day (BID) | ORAL | 0 refills | Status: DC | PRN

## 2021-01-21 NOTE — Progress Notes (Signed)
Post-Op Visit Note   Patient: Luis Villegas           Date of Birth: 1945-12-19           MRN: [id number] Visit Date: 01/21/2021 PCP: Billie Ruddy, MD   Assessment & Plan:  Chief Complaint:  Chief Complaint  Patient presents with   Left Shoulder - Routine Post Op   Visit Diagnoses:  1. Traumatic tear of supraspinatus tendon of left shoulder, initial encounter     Plan: Patient is a 75 year old male who presents s/p left shoulder rotator cuff repair and left trigger finger release on 01/14/2021.  Doing well, pain controlled.  Quit taking oxycodone due to itching.  Plan to prescribe hydrocodone on to see if this will help with pain but not cause itching.  Worst pain is at night but he is progressively improving in pain every day.  He is on CPM machine at 75 degrees.  On exam he has 15 degrees external rotation, 50 degrees abduction, 85 degrees forward flexion.  Incisions are healing well without any expressible drainage.  There is mild redness surrounding each incision but this does not seem to be cellulitis and more inflammation from the suture.  No fevers, chills, night sweats, malaise.  Incision in the palm is healing well but it is not ready for suture removal.  Sutures removed from his shoulder and replaced with Steri-Strips.  Plan to continue sling and follow-up in 1 week for removal of trigger finger sutures.  Follow-Up Instructions: No follow-ups on file.   Orders:  No orders of the defined types were placed in this encounter.  Meds ordered this encounter  Medications   HYDROcodone-acetaminophen (NORCO/VICODIN) 5-325 MG tablet    Sig: Take 1 tablet by mouth every 12 (twelve) hours as needed for moderate pain.    Dispense:  30 tablet    Refill:  0    Imaging: No results found.  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   Chronic kidney disease (CKD), stage III  (moderate) ([place]) 04/15/2019   VENTRAL HERNIA 11/23/2008   COLONIC POLYPS, HX OF 11/23/2008   HYPERLIPIDEMIA 11/11/2007   CARPAL TUNNEL SYNDROME 11/11/2007   GERD 11/11/2007   SLEEP APNEA 11/11/2007   PROSTATE CANCER, HX OF 11/11/2007   ANXIETY STATE NOS 05/18/2007   MIGRAINE NEC W/INTRACTABLE MIGRAINE 03/23/2007   ANEMIA-NOS 09/23/2006   Past Medical History:  Diagnosis Date   Allergy    Colon polyps 2016   Depression    GERD (gastroesophageal reflux disease)    Heart murmur    Hyperlipidemia    IBS (irritable bowel syndrome)    Kidney stones    stage 3    Prostate cancer ([place]) 2003   sx 2003 and radation   Sleep apnea    no cpap per pt   Sleep apnea with use of continuous positive airway pressure (CPAP)     Family History  Problem Relation Age of Onset   Stroke Father 29   Bladder Cancer Father    Breast cancer Mother    Diabetes Mother    Neuropathy Mother    Valvular heart disease Mother    Heart attack Mother 15   Prostate cancer Brother    Heart disease Brother    Heart attack Brother    Prostate cancer Brother    Colon cancer Neg Hx    Colon polyps Neg  Hx    Esophageal cancer Neg Hx    Stomach cancer Neg Hx    Rectal cancer Neg Hx     Past Surgical History:  Procedure Laterality Date   CARPAL TUNNEL RELEASE Bilateral    CERVICAL FUSION     COLONOSCOPY  2007, 08/09/2014   2007 [facility]/ 2016 [place]   COLONOSCOPY W/ POLYPECTOMY  2002   POLYPECTOMY  2016   polyps x3 T.A.   PROSTATECTOMY  2003   Dr.Davis   TONSILLECTOMY     Social History   Occupational History   Occupation: Retired  Tobacco Use   Smoking status: Former    Pack years: 0.00    Types: Cigarettes    Quit date: 08/04/1993    Years since quitting: 27.4   Smokeless tobacco: Never  Vaping Use   Vaping Use: Never used  Substance and Sexual Activity   Alcohol use: No   Drug use: No   Sexual activity: Not on file

## 2021-01-28 ENCOUNTER — Encounter: Payer: Self-pay | Admitting: Orthopedic Surgery

## 2021-01-28 ENCOUNTER — Ambulatory Visit (INDEPENDENT_AMBULATORY_CARE_PROVIDER_SITE_OTHER): Payer: Medicare Other | Admitting: Orthopedic Surgery

## 2021-01-28 ENCOUNTER — Other Ambulatory Visit: Payer: Self-pay

## 2021-01-28 DIAGNOSIS — M65332 Trigger finger, left middle finger: Secondary | ICD-10-CM

## 2021-01-28 NOTE — Progress Notes (Signed)
   Post-Op Visit Note   Patient: Luis Villegas           Date of Birth: 1945-10-02           MRN: [id number] Visit Date: 01/28/2021 PCP: Billie Ruddy, MD   Assessment & Plan:  Chief Complaint:  Chief Complaint  Patient presents with   Other    D/c sutures   Visit Diagnoses: No diagnosis found.  Plan: [name] comes in for suture removal from trigger finger release.  Shoulder is doing reasonably well.  Sutures are removed.  Follow-up in several weeks for clinical recheck.  No lifting left shoulder.  Follow-Up Instructions: Return in about 1 week (around 02/04/2021).   Orders:  No orders of the defined types were placed in this encounter.  No orders of the defined types were placed in this encounter.   Imaging: No results found.  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   Chronic kidney disease (CKD), stage III (moderate) ([place]) 04/15/2019   VENTRAL HERNIA 11/23/2008   COLONIC POLYPS, HX OF 11/23/2008   HYPERLIPIDEMIA 11/11/2007   CARPAL TUNNEL SYNDROME 11/11/2007   GERD 11/11/2007   SLEEP APNEA 11/11/2007   PROSTATE CANCER, HX OF 11/11/2007   ANXIETY STATE NOS 05/18/2007   MIGRAINE NEC W/INTRACTABLE MIGRAINE 03/23/2007   ANEMIA-NOS 09/23/2006   Past Medical History:  Diagnosis Date   Allergy    Colon polyps 2016   Depression    GERD (gastroesophageal reflux disease)    Heart murmur    Hyperlipidemia    IBS (irritable bowel syndrome)    Kidney stones    stage 3    Prostate cancer ([place]) 2003   sx 2003 and radation   Sleep apnea    no cpap per pt   Sleep apnea with use of continuous positive airway pressure (CPAP)     Family History  Problem Relation Age of Onset   Stroke Father 26   Bladder Cancer Father    Breast cancer Mother    Diabetes Mother    Neuropathy Mother    Valvular heart disease Mother    Heart attack Mother 22   Prostate cancer  Brother    Heart disease Brother    Heart attack Brother    Prostate cancer Brother    Colon cancer Neg Hx    Colon polyps Neg Hx    Esophageal cancer Neg Hx    Stomach cancer Neg Hx    Rectal cancer Neg Hx     Past Surgical History:  Procedure Laterality Date   CARPAL TUNNEL RELEASE Bilateral    CERVICAL FUSION     COLONOSCOPY  2007, 08/09/2014   2007 [facility]/ 2016 [place]   COLONOSCOPY W/ POLYPECTOMY  2002   POLYPECTOMY  2016   polyps x3 T.A.   PROSTATECTOMY  2003   Dr.Davis   TONSILLECTOMY     Social History   Occupational History   Occupation: Retired  Tobacco Use   Smoking status: Former    Pack years: 0.00    Types: Cigarettes    Quit date: 08/04/1993    Years since quitting: 27.5   Smokeless tobacco: Never  Vaping Use   Vaping Use: Never used  Substance and Sexual Activity   Alcohol use: No   Drug use: No   Sexual activity: Not on file

## 2021-01-31 ENCOUNTER — Ambulatory Visit (INDEPENDENT_AMBULATORY_CARE_PROVIDER_SITE_OTHER): Payer: Medicare Other | Admitting: Orthopedic Surgery

## 2021-01-31 ENCOUNTER — Other Ambulatory Visit: Payer: Self-pay

## 2021-01-31 DIAGNOSIS — M65332 Trigger finger, left middle finger: Secondary | ICD-10-CM

## 2021-02-02 ENCOUNTER — Encounter: Payer: Self-pay | Admitting: Orthopedic Surgery

## 2021-02-02 NOTE — Progress Notes (Signed)
Post-Op Visit Note   Patient: Luis Villegas           Date of Birth: [birth year]           MRN: [id number] Visit Date: 01/31/2021 PCP: Billie Ruddy, MD   Assessment & Plan:  Chief Complaint:  Chief Complaint  Patient presents with   Left Shoulder - Routine Post Op   Visit Diagnoses:  1. Trigger finger, left middle finger     Plan: Luis Villegas is now about 2 weeks out left shoulder arthroscopy with rotator cuff repair.  Has a very secure rotator cuff tear repair.  Also had left trigger finger release.  He is doing well with both.  On exam he is got pretty reasonable range of motion.  Like to discontinue the sling but no lifting with that left arm.  Start outpatient physical therapy.  4-week return.  Continue with CPM machine.  No heavy lifting with finger or hand either.  I do think wrist and clenching motion of the hand is okay at this point  Follow-Up Instructions: Return in about 4 weeks (around 02/28/2021).   Orders:  No orders of the defined types were placed in this encounter.  No orders of the defined types were placed in this encounter.   Imaging: No results found.  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   Chronic kidney disease (CKD), stage III (moderate) ([place]) 04/15/2019   VENTRAL HERNIA 11/23/2008   COLONIC POLYPS, HX OF 11/23/2008   HYPERLIPIDEMIA 11/11/2007   CARPAL TUNNEL SYNDROME 11/11/2007   GERD 11/11/2007   SLEEP APNEA 11/11/2007   PROSTATE CANCER, HX OF 11/11/2007   ANXIETY STATE NOS 05/18/2007   MIGRAINE NEC W/INTRACTABLE MIGRAINE 03/23/2007   ANEMIA-NOS 09/23/2006   Past Medical History:  Diagnosis Date   Allergy    Colon polyps 2016   Depression    GERD (gastroesophageal reflux disease)    Heart murmur    Hyperlipidemia    IBS (irritable bowel syndrome)    Kidney stones    stage 3    Prostate cancer ([place]) 2003   sx 2003 and radation    Sleep apnea    no cpap per pt   Sleep apnea with use of continuous positive airway pressure (CPAP)     Family History  Problem Relation Age of Onset   Stroke Father 69   Bladder Cancer Father    Breast cancer Mother    Diabetes Mother    Neuropathy Mother    Valvular heart disease Mother    Heart attack Mother 4   Prostate cancer Brother    Heart disease Brother    Heart attack Brother    Prostate cancer Brother    Colon cancer Neg Hx    Colon polyps Neg Hx    Esophageal cancer Neg Hx    Stomach cancer Neg Hx    Rectal cancer Neg Hx     Past Surgical History:  Procedure Laterality Date   CARPAL TUNNEL RELEASE Bilateral    CERVICAL FUSION     COLONOSCOPY  2007, 08/09/2014   2007 [facility]/ 2016 [place]   COLONOSCOPY W/ POLYPECTOMY  2002   POLYPECTOMY  2016   polyps x3 T.A.   PROSTATECTOMY  2003   Dr.Davis   TONSILLECTOMY     Social History   Occupational History   Occupation: Retired  Tobacco Use   Smoking status:  Former    Pack years: 0.00    Types: Cigarettes    Quit date: 08/04/1993    Years since quitting: 27.5   Smokeless tobacco: Never  Vaping Use   Vaping Use: Never used  Substance and Sexual Activity   Alcohol use: No   Drug use: No   Sexual activity: Not on file

## 2021-02-18 ENCOUNTER — Ambulatory Visit (INDEPENDENT_AMBULATORY_CARE_PROVIDER_SITE_OTHER): Payer: Medicare Other | Admitting: Orthopedic Surgery

## 2021-02-18 ENCOUNTER — Other Ambulatory Visit: Payer: Self-pay

## 2021-02-18 ENCOUNTER — Telehealth: Payer: Self-pay

## 2021-02-18 DIAGNOSIS — M65332 Trigger finger, left middle finger: Secondary | ICD-10-CM

## 2021-02-18 NOTE — Telephone Encounter (Signed)
I worked patient in for this afternoon due to post op complication.

## 2021-02-18 NOTE — Telephone Encounter (Signed)
Patient called triage. He said that he had surgery with Dr.Dean in June. This past weekend his shoulder became swollen, painful, and noticed a knot. He is suppose to start PT today. He wants to have it checked with Dr.Dean or his PA. Please call at [phone]. Thanks!

## 2021-02-19 ENCOUNTER — Encounter: Payer: Self-pay | Admitting: Orthopedic Surgery

## 2021-02-19 NOTE — Progress Notes (Signed)
Post-Op Visit Note   Patient: Luis Villegas           Date of Birth: 12/28/1945           MRN: [id number] Visit Date: 02/18/2021 PCP: Billie Ruddy, MD   Assessment & Plan:  Chief Complaint:  Chief Complaint  Patient presents with   Other    Left arm   Visit Diagnoses:  1. Trigger finger, left middle finger     Plan: Luis Villegas is a 75 year old patient underwent left shoulder rotator cuff tear rotation strength of the rotator cuff is otherwise good to external rotation strength testing.  Would like him to start physical therapy and 3-week return.  Tylenol for pain with occasional Norco.  I think if he has pulled the biceps tendon loose then it would be a cosmetic deformity which should not inhibit his rehabilitation.  Slightly asymmetric.  Subscap strength is about 5- out of 5 on the left  Follow-Up Instructions: Return in about 3 weeks (around 03/11/2021).   Orders:  No orders of the defined types were placed in this encounter.  No orders of the defined types were placed in this encounter.   Imaging: No results found.  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   Chronic kidney disease (CKD), stage III (moderate) ([place]) 04/15/2019   VENTRAL HERNIA 11/23/2008   COLONIC POLYPS, HX OF 11/23/2008   HYPERLIPIDEMIA 11/11/2007   CARPAL TUNNEL SYNDROME 11/11/2007   GERD 11/11/2007   SLEEP APNEA 11/11/2007   PROSTATE CANCER, HX OF 11/11/2007   ANXIETY STATE NOS 05/18/2007   MIGRAINE NEC W/INTRACTABLE MIGRAINE 03/23/2007   ANEMIA-NOS 09/23/2006   Past Medical History:  Diagnosis Date   Allergy    Colon polyps 2016   Depression    GERD (gastroesophageal reflux disease)    Heart murmur    Hyperlipidemia    IBS (irritable bowel syndrome)    Kidney stones    stage 3    Prostate cancer ([place]) 2003   sx 2003 and radation   Sleep apnea    no cpap per pt   Sleep apnea with  use of continuous positive airway pressure (CPAP)     Family History  Problem Relation Age of Onset   Stroke Father 3   Bladder Cancer Father    Breast cancer Mother    Diabetes Mother    Neuropathy Mother    Valvular heart disease Mother    Heart attack Mother 75   Prostate cancer Brother    Heart disease Brother    Heart attack Brother    Prostate cancer Brother    Colon cancer Neg Hx    Colon polyps Neg Hx    Esophageal cancer Neg Hx    Stomach cancer Neg Hx    Rectal cancer Neg Hx     Past Surgical History:  Procedure Laterality Date   CARPAL TUNNEL RELEASE Bilateral    CERVICAL FUSION     COLONOSCOPY  2007, 08/09/2014   2007 [facility]/ 2016 [place]   COLONOSCOPY W/ POLYPECTOMY  2002   POLYPECTOMY  2016   polyps x3 T.A.   PROSTATECTOMY  2003   Dr.Davis   TONSILLECTOMY     Social History   Occupational History   Occupation: Retired  Tobacco Use   Smoking status: Former    Types: Cigarettes    Quit date: 08/04/1993    Years since  quitting: 27.5   Smokeless tobacco: Never  Vaping Use   Vaping Use: Never used  Substance and Sexual Activity   Alcohol use: No   Drug use: No   Sexual activity: Not on file

## 2021-02-26 DIAGNOSIS — M75101 Unspecified rotator cuff tear or rupture of right shoulder, not specified as traumatic: Secondary | ICD-10-CM | POA: Diagnosis not present

## 2021-02-26 DIAGNOSIS — M75111 Incomplete rotator cuff tear or rupture of right shoulder, not specified as traumatic: Secondary | ICD-10-CM | POA: Diagnosis not present

## 2021-02-28 ENCOUNTER — Encounter: Payer: Medicare Other | Admitting: Orthopedic Surgery

## 2021-03-01 DIAGNOSIS — M75111 Incomplete rotator cuff tear or rupture of right shoulder, not specified as traumatic: Secondary | ICD-10-CM | POA: Diagnosis not present

## 2021-03-01 DIAGNOSIS — M75101 Unspecified rotator cuff tear or rupture of right shoulder, not specified as traumatic: Secondary | ICD-10-CM | POA: Diagnosis not present

## 2021-03-04 DIAGNOSIS — M75111 Incomplete rotator cuff tear or rupture of right shoulder, not specified as traumatic: Secondary | ICD-10-CM | POA: Diagnosis not present

## 2021-03-04 DIAGNOSIS — M75101 Unspecified rotator cuff tear or rupture of right shoulder, not specified as traumatic: Secondary | ICD-10-CM | POA: Diagnosis not present

## 2021-03-07 DIAGNOSIS — M75101 Unspecified rotator cuff tear or rupture of right shoulder, not specified as traumatic: Secondary | ICD-10-CM | POA: Diagnosis not present

## 2021-03-07 DIAGNOSIS — M75111 Incomplete rotator cuff tear or rupture of right shoulder, not specified as traumatic: Secondary | ICD-10-CM | POA: Diagnosis not present

## 2021-03-11 ENCOUNTER — Other Ambulatory Visit: Payer: Self-pay

## 2021-03-11 ENCOUNTER — Ambulatory Visit (INDEPENDENT_AMBULATORY_CARE_PROVIDER_SITE_OTHER): Payer: Medicare Other | Admitting: Orthopedic Surgery

## 2021-03-11 DIAGNOSIS — M75111 Incomplete rotator cuff tear or rupture of right shoulder, not specified as traumatic: Secondary | ICD-10-CM | POA: Diagnosis not present

## 2021-03-11 DIAGNOSIS — S46812A Strain of other muscles, fascia and tendons at shoulder and upper arm level, left arm, initial encounter: Secondary | ICD-10-CM

## 2021-03-11 DIAGNOSIS — M75101 Unspecified rotator cuff tear or rupture of right shoulder, not specified as traumatic: Secondary | ICD-10-CM | POA: Diagnosis not present

## 2021-03-13 DIAGNOSIS — M75101 Unspecified rotator cuff tear or rupture of right shoulder, not specified as traumatic: Secondary | ICD-10-CM | POA: Diagnosis not present

## 2021-03-13 DIAGNOSIS — M75111 Incomplete rotator cuff tear or rupture of right shoulder, not specified as traumatic: Secondary | ICD-10-CM | POA: Diagnosis not present

## 2021-03-21 DIAGNOSIS — M75101 Unspecified rotator cuff tear or rupture of right shoulder, not specified as traumatic: Secondary | ICD-10-CM | POA: Diagnosis not present

## 2021-03-21 DIAGNOSIS — M75111 Incomplete rotator cuff tear or rupture of right shoulder, not specified as traumatic: Secondary | ICD-10-CM | POA: Diagnosis not present

## 2021-03-26 ENCOUNTER — Encounter: Payer: Self-pay | Admitting: Orthopedic Surgery

## 2021-03-26 DIAGNOSIS — M75101 Unspecified rotator cuff tear or rupture of right shoulder, not specified as traumatic: Secondary | ICD-10-CM | POA: Diagnosis not present

## 2021-03-26 DIAGNOSIS — M75111 Incomplete rotator cuff tear or rupture of right shoulder, not specified as traumatic: Secondary | ICD-10-CM | POA: Diagnosis not present

## 2021-03-26 NOTE — Progress Notes (Signed)
Post-Op Visit Note   Patient: Luis Villegas           Date of Birth: 19-Nov-1945           MRN: [id number] Visit Date: 03/11/2021 PCP: Billie Ruddy, MD   Assessment & Plan:  Chief Complaint:  Chief Complaint  Patient presents with   Other    01/14/21 left shoulder RCT and left trigger release   Visit Diagnoses: No diagnosis found.  Plan: Patient is a 75 year old male who presents for repeat evaluation following left shoulder rotator cuff tear repair and left trigger finger release on 01/14/2021.  He is doing well with no complaints.  He is scheduled to finish physical therapy on September 1.  He is found the therapy very helpful.  He does have occasional pain in the anterior lateral shoulder but nothing that he needs to take any medication for aside from the occasional Tylenol.  On exam he has 50 degrees external rotation, 70 degrees abduction, 100 degrees forward flexion.  Incisions are well-healed.  No crepitus noted with passive motion shoulder.  Excellent rotator cuff strength of supra, infra, subscap with a little bit of reduced subscap strength at 5 -/5.  He has just started strengthening in physical therapy and anticipate this will improve by his next appointment.  Follow-up in 6 weeks for final check with Dr. Marlou Sa.His finger is improving as well as he has no distressing, residual stiffness of the digit and no recurrent triggering.  Follow-Up Instructions: No follow-ups on file.   Orders:  No orders of the defined types were placed in this encounter.  No orders of the defined types were placed in this encounter.   Imaging: No results found.  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   Chronic kidney disease (CKD), stage III (moderate) ([place]) 04/15/2019   VENTRAL HERNIA 11/23/2008   COLONIC POLYPS, HX OF 11/23/2008   HYPERLIPIDEMIA 11/11/2007   CARPAL TUNNEL SYNDROME  11/11/2007   GERD 11/11/2007   SLEEP APNEA 11/11/2007   PROSTATE CANCER, HX OF 11/11/2007   ANXIETY STATE NOS 05/18/2007   MIGRAINE NEC W/INTRACTABLE MIGRAINE 03/23/2007   ANEMIA-NOS 09/23/2006   Past Medical History:  Diagnosis Date   Allergy    Colon polyps 2016   Depression    GERD (gastroesophageal reflux disease)    Heart murmur    Hyperlipidemia    IBS (irritable bowel syndrome)    Kidney stones    stage 3    Prostate cancer ([place]) 2003   sx 2003 and radation   Sleep apnea    no cpap per pt   Sleep apnea with use of continuous positive airway pressure (CPAP)     Family History  Problem Relation Age of Onset   Stroke Father 75   Bladder Cancer Father    Breast cancer Mother    Diabetes Mother    Neuropathy Mother    Valvular heart disease Mother    Heart attack Mother 81   Prostate cancer Brother    Heart disease Brother    Heart attack Brother    Prostate cancer Brother    Colon cancer Neg Hx    Colon polyps Neg Hx    Esophageal cancer Neg Hx    Stomach cancer Neg Hx    Rectal cancer Neg Hx     Past Surgical History:  Procedure Laterality Date   CARPAL TUNNEL  RELEASE Bilateral    CERVICAL FUSION     COLONOSCOPY  2007, 08/09/2014   2007 [facility]/ 2016 [place]   COLONOSCOPY W/ POLYPECTOMY  2002   POLYPECTOMY  2016   polyps x3 T.A.   PROSTATECTOMY  2003   Dr.Davis   TONSILLECTOMY     Social History   Occupational History   Occupation: Retired  Tobacco Use   Smoking status: Former    Types: Cigarettes    Quit date: 08/04/1993    Years since quitting: 27.6   Smokeless tobacco: Never  Vaping Use   Vaping Use: Never used  Substance and Sexual Activity   Alcohol use: No   Drug use: No   Sexual activity: Not on file

## 2021-03-28 DIAGNOSIS — M75101 Unspecified rotator cuff tear or rupture of right shoulder, not specified as traumatic: Secondary | ICD-10-CM | POA: Diagnosis not present

## 2021-03-28 DIAGNOSIS — M75111 Incomplete rotator cuff tear or rupture of right shoulder, not specified as traumatic: Secondary | ICD-10-CM | POA: Diagnosis not present

## 2021-04-02 DIAGNOSIS — M75101 Unspecified rotator cuff tear or rupture of right shoulder, not specified as traumatic: Secondary | ICD-10-CM | POA: Diagnosis not present

## 2021-04-02 DIAGNOSIS — M75111 Incomplete rotator cuff tear or rupture of right shoulder, not specified as traumatic: Secondary | ICD-10-CM | POA: Diagnosis not present

## 2021-04-04 DIAGNOSIS — M75111 Incomplete rotator cuff tear or rupture of right shoulder, not specified as traumatic: Secondary | ICD-10-CM | POA: Diagnosis not present

## 2021-04-04 DIAGNOSIS — M75101 Unspecified rotator cuff tear or rupture of right shoulder, not specified as traumatic: Secondary | ICD-10-CM | POA: Diagnosis not present

## 2021-04-08 ENCOUNTER — Other Ambulatory Visit: Payer: Self-pay | Admitting: Family Medicine

## 2021-04-10 ENCOUNTER — Other Ambulatory Visit: Payer: Self-pay

## 2021-04-11 ENCOUNTER — Ambulatory Visit (INDEPENDENT_AMBULATORY_CARE_PROVIDER_SITE_OTHER): Payer: Medicare Other

## 2021-04-11 DIAGNOSIS — Z23 Encounter for immunization: Secondary | ICD-10-CM | POA: Diagnosis not present

## 2021-04-15 ENCOUNTER — Ambulatory Visit (INDEPENDENT_AMBULATORY_CARE_PROVIDER_SITE_OTHER): Payer: Medicare Other | Admitting: Orthopedic Surgery

## 2021-04-15 ENCOUNTER — Other Ambulatory Visit: Payer: Self-pay

## 2021-04-15 DIAGNOSIS — S46812A Strain of other muscles, fascia and tendons at shoulder and upper arm level, left arm, initial encounter: Secondary | ICD-10-CM

## 2021-04-19 ENCOUNTER — Ambulatory Visit: Payer: Medicare Other

## 2021-04-21 ENCOUNTER — Encounter: Payer: Self-pay | Admitting: Orthopedic Surgery

## 2021-04-21 NOTE — Progress Notes (Signed)
Post-Op Visit Note   Patient: Luis Villegas           Date of Birth: 1945/09/09           MRN: [id number] Visit Date: 04/15/2021 PCP: Billie Ruddy, MD   Assessment & Plan:  Chief Complaint:  Chief Complaint  Patient presents with   Left Shoulder - Routine Post Op       01/14/21 left shoulder RCT and left trigger release     Visit Diagnoses:  1. Traumatic tear of supraspinatus tendon of left shoulder, initial encounter     Plan: Luis Villegas is a patient underwent left shoulder rotator cuff tear and trigger finger release on 01/14/2021.  Therapy ended on the first.  He is able to do his therapeutic exercises from home.  He does have a home pulley.  He is able to sleep on the left-hand side.  On examination he has excellent range of motion of the left shoulder with good rotator cuff strength and no coarseness or grinding with internal and external rotation of the arm.  Plan at this time is to be careful with further activities.  I think he would be okay to start doing some golf putting and chipping going up to short irons and then long irons over about a 1 month.  Follow-up as needed.  Follow-Up Instructions: No follow-ups on file.   Orders:  No orders of the defined types were placed in this encounter.  No orders of the defined types were placed in this encounter.   Imaging: No results found.  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   Chronic kidney disease (CKD), stage III (moderate) ([place]) 04/15/2019   VENTRAL HERNIA 11/23/2008   COLONIC POLYPS, HX OF 11/23/2008   HYPERLIPIDEMIA 11/11/2007   CARPAL TUNNEL SYNDROME 11/11/2007   GERD 11/11/2007   SLEEP APNEA 11/11/2007   PROSTATE CANCER, HX OF 11/11/2007   ANXIETY STATE NOS 05/18/2007   MIGRAINE NEC W/INTRACTABLE MIGRAINE 03/23/2007   ANEMIA-NOS 09/23/2006   Past Medical History:  Diagnosis Date   Allergy    Colon  polyps 2016   Depression    GERD (gastroesophageal reflux disease)    Heart murmur    Hyperlipidemia    IBS (irritable bowel syndrome)    Kidney stones    stage 3    Prostate cancer ([place]) 2003   sx 2003 and radation   Sleep apnea    no cpap per pt   Sleep apnea with use of continuous positive airway pressure (CPAP)     Family History  Problem Relation Age of Onset   Stroke Father 24   Bladder Cancer Father    Breast cancer Mother    Diabetes Mother    Neuropathy Mother    Valvular heart disease Mother    Heart attack Mother 67   Prostate cancer Brother    Heart disease Brother    Heart attack Brother    Prostate cancer Brother    Colon cancer Neg Hx    Colon polyps Neg Hx    Esophageal cancer Neg Hx    Stomach cancer Neg Hx    Rectal cancer Neg Hx     Past Surgical History:  Procedure Laterality Date   CARPAL TUNNEL RELEASE Bilateral    CERVICAL FUSION     COLONOSCOPY  2007, 08/09/2014   2007 [facility]/ 2016 [place]   COLONOSCOPY W/  POLYPECTOMY  2002   POLYPECTOMY  2016   polyps x3 T.A.   PROSTATECTOMY  2003   Dr.Davis   TONSILLECTOMY     Social History   Occupational History   Occupation: Retired  Tobacco Use   Smoking status: Former    Types: Cigarettes    Quit date: 08/04/1993    Years since quitting: 27.7   Smokeless tobacco: Never  Vaping Use   Vaping Use: Never used  Substance and Sexual Activity   Alcohol use: No   Drug use: No   Sexual activity: Not on file

## 2021-04-25 ENCOUNTER — Encounter: Payer: Self-pay | Admitting: Family Medicine

## 2021-04-25 ENCOUNTER — Telehealth (INDEPENDENT_AMBULATORY_CARE_PROVIDER_SITE_OTHER): Payer: Medicare Other | Admitting: Family Medicine

## 2021-04-25 VITALS — Temp 99.0°F

## 2021-04-25 DIAGNOSIS — J309 Allergic rhinitis, unspecified: Secondary | ICD-10-CM | POA: Diagnosis not present

## 2021-04-25 MED ORDER — FEXOFENADINE HCL 180 MG PO TABS: 180.0000 mg | ORAL_TABLET | Freq: Every day | ORAL | 1 refills | Status: DC

## 2021-04-25 NOTE — Progress Notes (Signed)
Virtual Visit via Video Note  I connected with Luis Villegas on 04/25/21 at 11:00 AM EDT by a video enabled telemedicine application 2/2 [id number]-11 pandemic and verified that I am speaking with the correct person using two identifiers.  Location patient: home Location provider:work or home office Persons participating in the virtual visit: patient, provider.  Patient's wife in background.  I discussed the limitations of evaluation and management by telemedicine and the availability of in person appointments. The patient expressed understanding and agreed to proceed.  Chief Complaint  Patient presents with   Nasal Congestion    Stuffiness, right ear feels stopped upp, pressure on the rt side near ear, been going on for over a month. Has tried benadryl, has helped some but not much.     HPI: Pt with ongoing nasal congestion x over a month.  Having stuffiness in R ear/pressure x 3 wks.  Taking Benadryl twice daily which helps some.  Endorses ear pruritus, sneezing and rhinorrhea.  Denies facial pain/pressure or dental pains, cough, sore throat.  Had tear duct clean and his home thinking it may be contributing to the symptoms.  Patient using saline nasal rinse.  Patient's wife states she has a few pills of Claritin there at the house.  Patient recently seen by ENT for epistaxis s/p cauterization.  Doing well.  ROS: See pertinent positives and negatives per HPI.  Past Medical History:  Diagnosis Date   Allergy    Colon polyps 2016   Depression    GERD (gastroesophageal reflux disease)    Heart murmur    Hyperlipidemia    IBS (irritable bowel syndrome)    Kidney stones    stage 3    Prostate cancer ([place]) 2003   sx 2003 and radation   Sleep apnea    no cpap per pt   Sleep apnea with use of continuous positive airway pressure (CPAP)     Past Surgical History:  Procedure Laterality Date   CARPAL TUNNEL RELEASE Bilateral    CERVICAL FUSION     COLONOSCOPY  2007, 08/09/2014   2007  [facility]/ 2016 [place]   COLONOSCOPY W/ POLYPECTOMY  2002   POLYPECTOMY  2016   polyps x3 T.A.   PROSTATECTOMY  2003   Dr.Davis   TONSILLECTOMY      Family History  Problem Relation Age of Onset   Stroke Father 42   Bladder Cancer Father    Breast cancer Mother    Diabetes Mother    Neuropathy Mother    Valvular heart disease Mother    Heart attack Mother 29   Prostate cancer Brother    Heart disease Brother    Heart attack Brother    Prostate cancer Brother    Colon cancer Neg Hx    Colon polyps Neg Hx    Esophageal cancer Neg Hx    Stomach cancer Neg Hx    Rectal cancer Neg Hx      Current Outpatient Medications:    acetaminophen (TYLENOL) 500 MG tablet, Take 500 mg by mouth every 6 (six) hours as needed., Disp: , Rfl:    acyclovir (ZOVIRAX) 200 MG capsule, TAKE 1 CAPSULE(200 MG) BY MOUTH DAILY, Disp: 90 capsule, Rfl: 3   buPROPion (WELLBUTRIN XL) 150 MG 24 hr tablet, TAKE 1 TABLET(150 MG) BY MOUTH DAILY, Disp: 90 tablet, Rfl: 0   citalopram (CELEXA) 20 MG tablet, TAKE 1 TABLET(20 MG) BY MOUTH DAILY, Disp: 90 tablet, Rfl: 0   ezetimibe-simvastatin (VYTORIN) 10-40 MG tablet, TAKE  1 TABLET BY MOUTH AT BEDTIME, Disp: 90 tablet, Rfl: 3   gabapentin (NEURONTIN) 600 MG tablet, Take 0.5 tablets (300 mg total) by mouth 3 (three) times daily., Disp: 15 tablet, Rfl: 0   methocarbamol (ROBAXIN) 500 MG tablet, Take 1 tablet (500 mg total) by mouth every 8 (eight) hours as needed., Disp: 30 tablet, Rfl: 0   Multiple Vitamin (MULTIVITAMIN ADULT PO), Take by mouth., Disp: , Rfl:    Omega-3 Fatty Acids (FISH OIL PO), Take by mouth., Disp: , Rfl:    omeprazole (PRILOSEC) 20 MG capsule, TAKE 1 CAPSULE BY MOUTH EVERY DAY, Disp: 90 capsule, Rfl: 0   solifenacin (VESICARE) 5 MG tablet, Take 5 mg by mouth daily., Disp: , Rfl:    SUMAtriptan (IMITREX) 100 MG tablet, TAKE 1 TABLET BY MOUTH AS NEEDED FOR HEADACHE, Disp: 9 tablet, Rfl: 6   topiramate (TOPAMAX) 200 MG tablet, TAKE 1 TABLET(200  MG) BY MOUTH AT BEDTIME, Disp: 90 tablet, Rfl: 0   triamcinolone (KENALOG) 0.025 % cream, Apply 1 application topically 2 (two) times daily., Disp: 30 g, Rfl: 0   HYDROcodone-acetaminophen (NORCO/VICODIN) 5-325 MG tablet, Take 1 tablet by mouth every 12 (twelve) hours as needed for moderate pain., Disp: 30 tablet, Rfl: 0  EXAM:  VITALS per patient if applicable: RR between 83-09 bpm  GENERAL: alert, oriented, appears well and in no acute distress  HEENT: atraumatic, conjunctiva clear, no obvious abnormalities on inspection of external nose and ears  NECK: normal movements of the head and neck  LUNGS: on inspection no signs of respiratory distress, breathing rate appears normal, no obvious gross SOB, gasping or wheezing  CV: no obvious cyanosis  MS: moves all visible extremities without noticeable abnormality  PSYCH/NEURO: pleasant and cooperative, no obvious depression or anxiety, speech and thought processing grossly intact  ASSESSMENT AND PLAN:  Discussed the following assessment and plan:  Allergic rhinitis, unspecified seasonality, unspecified trigger -Advised symptoms likely 2/2 allergies.  Must also consider GERD. -We will start taking OTC Claritin for the next few days (patient's wife has a few Claritin at home).   -If no improvement noted will try Allegra.  Rx sent to pharmacy. -Continue saline nasal rinse -For continued or worsening symptoms reevaluate/consider sinusitis. -Continue follow-up with ENT - Plan: fexofenadine (ALLEGRA) 180 MG tablet  Follow-up as needed   I discussed the assessment and treatment plan with the patient. The patient was provided an opportunity to ask questions and all were answered. The patient agreed with the plan and demonstrated an understanding of the instructions.   The patient was advised to call back or seek an in-person evaluation if the symptoms worsen or if the condition fails to improve as anticipated.  Billie Ruddy, MD

## 2021-05-03 ENCOUNTER — Ambulatory Visit (INDEPENDENT_AMBULATORY_CARE_PROVIDER_SITE_OTHER): Payer: Medicare Other

## 2021-05-03 DIAGNOSIS — Z0000 Encounter for general adult medical examination without abnormal findings: Secondary | ICD-10-CM | POA: Diagnosis not present

## 2021-05-03 NOTE — Progress Notes (Signed)
Subjective:   Luis Villegas is a 75 y.o. male who presents for an Initial Medicare Annual Wellness Visit.   Virtual Visit via Video Note  I connected with Luis Villegas by a video enabled telemedicine application and verified that I am speaking with the correct person using two identifiers.  Location: Patient: Home Provider: Office Persons participating in the virtual visit: patient, provider   I discussed the limitations of evaluation and management by telemedicine and the availability of in person appointments. The patient expressed understanding and agreed to proceed.     Randel Pigg ,LPN  Review of Systems    N/A       Objective:    There were no vitals filed for this visit. There is no height or weight on file to calculate BMI.  Advanced Directives 04/18/2020  Does Patient Have a Medical Advance Directive? No  Would patient like information on creating a medical advance directive? No - Patient declined    Current Medications (verified) Outpatient Encounter Medications as of 05/03/2021  Medication Sig   acetaminophen (TYLENOL) 500 MG tablet Take 500 mg by mouth every 6 (six) hours as needed.   acyclovir (ZOVIRAX) 200 MG capsule TAKE 1 CAPSULE(200 MG) BY MOUTH DAILY   buPROPion (WELLBUTRIN XL) 150 MG 24 hr tablet TAKE 1 TABLET(150 MG) BY MOUTH DAILY   citalopram (CELEXA) 20 MG tablet TAKE 1 TABLET(20 MG) BY MOUTH DAILY   ezetimibe-simvastatin (VYTORIN) 10-40 MG tablet TAKE 1 TABLET BY MOUTH AT BEDTIME   fexofenadine (ALLEGRA) 180 MG tablet Take 1 tablet (180 mg total) by mouth daily.   gabapentin (NEURONTIN) 600 MG tablet Take 0.5 tablets (300 mg total) by mouth 3 (three) times daily.   HYDROcodone-acetaminophen (NORCO/VICODIN) 5-325 MG tablet Take 1 tablet by mouth every 12 (twelve) hours as needed for moderate pain.   methocarbamol (ROBAXIN) 500 MG tablet Take 1 tablet (500 mg total) by mouth every 8 (eight) hours as needed.   Multiple Vitamin  (MULTIVITAMIN ADULT PO) Take by mouth.   Omega-3 Fatty Acids (FISH OIL PO) Take by mouth.   omeprazole (PRILOSEC) 20 MG capsule TAKE 1 CAPSULE BY MOUTH EVERY DAY   solifenacin (VESICARE) 5 MG tablet Take 5 mg by mouth daily.   SUMAtriptan (IMITREX) 100 MG tablet TAKE 1 TABLET BY MOUTH AS NEEDED FOR HEADACHE   topiramate (TOPAMAX) 200 MG tablet TAKE 1 TABLET(200 MG) BY MOUTH AT BEDTIME   triamcinolone (KENALOG) 0.025 % cream Apply 1 application topically 2 (two) times daily.   No facility-administered encounter medications on file as of 05/03/2021.    Allergies (verified) Lipitor [atorvastatin calcium]   History: Past Medical History:  Diagnosis Date   Allergy    Colon polyps 2016   Depression    GERD (gastroesophageal reflux disease)    Heart murmur    Hyperlipidemia    IBS (irritable bowel syndrome)    Kidney stones    stage 3    Prostate cancer ([place]) 2003   sx 2003 and radation   Sleep apnea    no cpap per pt   Sleep apnea with use of continuous positive airway pressure (CPAP)    Past Surgical History:  Procedure Laterality Date   CARPAL TUNNEL RELEASE Bilateral    CERVICAL FUSION     COLONOSCOPY  2007, 08/09/2014   2007 [facility]/ 2016 [facility]   COLONOSCOPY W/ POLYPECTOMY  2002   POLYPECTOMY  2016   polyps x3 T.A.   PROSTATECTOMY  2003   Dr.Davis  TONSILLECTOMY     Family History  Problem Relation Age of Onset   Stroke Father 18   Bladder Cancer Father    Breast cancer Mother    Diabetes Mother    Neuropathy Mother    Valvular heart disease Mother    Heart attack Mother 53   Prostate cancer Brother    Heart disease Brother    Heart attack Brother    Prostate cancer Brother    Colon cancer Neg Hx    Colon polyps Neg Hx    Esophageal cancer Neg Hx    Stomach cancer Neg Hx    Rectal cancer Neg Hx    Social History   Socioeconomic History   Marital status: Married    Spouse name: Not on file   Number of children: 1   Years of education: Not on  file   Highest education level: Not on file  Occupational History   Occupation: Retired  Tobacco Use   Smoking status: Former    Types: Cigarettes    Quit date: 08/04/1993    Years since quitting: 27.7   Smokeless tobacco: Never  Vaping Use   Vaping Use: Never used  Substance and Sexual Activity   Alcohol use: No   Drug use: No   Sexual activity: Not on file  Other Topics Concern   Not on file  Social History Narrative   Not on file   Social Determinants of Health   Financial Resource Strain: Not on file  Food Insecurity: Not on file  Transportation Needs: Not on file  Physical Activity: Not on file  Stress: Not on file  Social Connections: Not on file    Tobacco Counseling Counseling given: Not Answered   Clinical Intake:                 Diabetic?no         Activities of Daily Living No flowsheet data found.  Patient Care Team: Billie Ruddy, MD as PCP - General (Family Medicine)  Indicate any recent Medical Services you may have received from other than [facility] providers in the past year (date may be approximate).     Assessment:   This is a routine wellness examination for [name].  Hearing/Vision screen No results found.  Dietary issues and exercise activities discussed:     Goals Addressed   None    Depression Screen PHQ 2/9 Scores 04/18/2020 12/23/2017 07/08/2017  PHQ - 2 Score 0 0 0  PHQ- 9 Score 0 2 -    Fall Risk Fall Risk  04/18/2020 07/08/2017  Falls in the past year? 1 No  Number falls in past yr: 0 -  Injury with Fall? 0 -  Risk for fall due to : History of fall(s) -  Follow up Falls evaluation completed;Falls prevention discussed -    FALL RISK PREVENTION PERTAINING TO THE HOME:  Any stairs in or around the home? Yes  If so, are there any without handrails? No  Home free of loose throw rugs in walkways, pet beds, electrical cords, etc? Yes  Adequate lighting in your home to reduce risk of falls? Yes   ASSISTIVE  DEVICES UTILIZED TO PREVENT FALLS:  Life alert? No  Use of a cane, walker or w/c? No  Grab bars in the bathroom? No  Shower chair or bench in shower? No  Elevated toilet seat or a handicapped toilet? No   Cognitive Function:Normal cognitive status assessed by direct observation by this Nurse Health Advisor.  No abnormalities found.       6CIT Screen 04/18/2020  What Year? 0 points  What month? 0 points  What time? 0 points  Count back from 20 0 points  Months in reverse 0 points  Repeat phrase 4 points  Total Score 4    Immunizations Immunization History  Administered Date(s) Administered   Fluad Quad(high Dose 65+) 04/18/2020, 04/11/2021   Influenza, High Dose Seasonal PF 04/07/2018, 03/25/2019, 03/25/2019   Influenza-Unspecified 03/25/2019   PFIZER(Purple Top)SARS-COV-2 Vaccination 09/18/2019, 10/11/2019, 05/11/2020   Td 04/12/2008   Tdap 05/04/2018   Zoster, Live 01/25/2010    TDAP status: Up to date  Flu Vaccine status: Up to date  Pneumococcal vaccine status: Up to date  Covid-19 vaccine status: Completed vaccines  Qualifies for Shingles Vaccine? Yes   Zostavax completed No   Shingrix Completed?: No.    Education has been provided regarding the importance of this vaccine. Patient has been advised to call insurance company to determine out of pocket expense if they have not yet received this vaccine. Advised may also receive vaccine at local pharmacy or Health Dept. Verbalized acceptance and understanding.  Screening Tests Health Maintenance  Topic Date Due   Hepatitis C Screening  Never done   Zoster Vaccines- Shingrix (1 of 2) Never done   COVID-19 Vaccine (4 - Booster for Pfizer series) 08/03/2020   COLONOSCOPY (Pts 45-62yrs Insurance coverage will need to be confirmed)  12/21/2022   TETANUS/TDAP  05/04/2028   INFLUENZA VACCINE  Completed   HPV VACCINES  Aged Out    Health Maintenance  Health Maintenance Due  Topic Date Due   Hepatitis C Screening   Never done   Zoster Vaccines- Shingrix (1 of 2) Never done   COVID-19 Vaccine (4 - Booster for Pfizer series) 08/03/2020    Colorectal cancer screening: Type of screening: Colonoscopy. Completed 12/21/2019. Repeat every 3 years  Lung Cancer Screening: (Low Dose CT Chest recommended if Age 22-80 years, 30 pack-year currently smoking OR have quit w/in 15years.) does not qualify.   Lung Cancer Screening Referral: n/a  Additional Screening:  Hepatitis C Screening: does qualify;   Vision Screening: Recommended annual ophthalmology exams for early detection of glaucoma and other disorders of the eye. Is the patient up to date with their annual eye exam?  Yes  Who is the provider or what is the name of the office in which the patient attends annual eye exams? [facility]  If pt is not established with a provider, would they like to be referred to a provider to establish care? No .   Dental Screening: Recommended annual dental exams for proper oral hygiene  Community Resource Referral / Chronic Care Management: CRR required this visit?  No   CCM required this visit?  No      Plan:     I have personally reviewed and noted the following in the patient's chart:   Medical and social history Use of alcohol, tobacco or illicit drugs  Current medications and supplements including opioid prescriptions. Patient is not currently taking opioid prescriptions. Functional ability and status Nutritional status Physical activity Advanced directives List of other physicians Hospitalizations, surgeries, and ER visits in previous 12 months Vitals Screenings to include cognitive, depression, and falls Referrals and appointments  In addition, I have reviewed and discussed with patient certain preventive protocols, quality metrics, and best practice recommendations. A written personalized care plan for preventive services as well as general preventive health recommendations were provided to  patient.     Randel Pigg, LPN   [id number]   Nurse Notes: none

## 2021-05-03 NOTE — Patient Instructions (Signed)
Luis Villegas , Thank you for taking time to come for your Medicare Wellness Visit. I appreciate your ongoing commitment to your health goals. Please review the following plan we discussed and let me know if I can assist you in the future.   Screening recommendations/referrals: Colonoscopy: 12/21/2019  due 2024 Recommended yearly ophthalmology/optometry visit for glaucoma screening and checkup Recommended yearly dental visit for hygiene and checkup  Vaccinations: Influenza vaccine: completed  Pneumococcal vaccine: due 2nd dose per patient  Tdap vaccine: 05/04/2018 Shingles vaccine: will consider    Advanced directives: none   Conditions/risks identified: none   Next appointment: 05/09/2021  1130  Dr. Volanda Napoleon   Preventive Care 21 Years and Older, Male Preventive care refers to lifestyle choices and visits with your health care provider that can promote health and wellness. What does preventive care include? A yearly physical exam. This is also called an annual well check. Dental exams once or twice a year. Routine eye exams. Ask your health care provider how often you should have your eyes checked. Personal lifestyle choices, including: Daily care of your teeth and gums. Regular physical activity. Eating a healthy diet. Avoiding tobacco and drug use. Limiting alcohol use. Practicing safe sex. Taking low doses of aspirin every day. Taking vitamin and mineral supplements as recommended by your health care provider. What happens during an annual well check? The services and screenings done by your health care provider during your annual well check will depend on your age, overall health, lifestyle risk factors, and family history of disease. Counseling  Your health care provider may ask you questions about your: Alcohol use. Tobacco use. Drug use. Emotional well-being. Home and relationship well-being. Sexual activity. Eating habits. History of falls. Memory and ability to  understand (cognition). Work and work Statistician. Screening  You may have the following tests or measurements: Height, weight, and BMI. Blood pressure. Lipid and cholesterol levels. These may be checked every 5 years, or more frequently if you are over 22 years old. Skin check. Lung cancer screening. You may have this screening every year starting at age 67 if you have a 30-pack-year history of smoking and currently smoke or have quit within the past 15 years. Fecal occult blood test (FOBT) of the stool. You may have this test every year starting at age 38. Flexible sigmoidoscopy or colonoscopy. You may have a sigmoidoscopy every 5 years or a colonoscopy every 10 years starting at age 20. Prostate cancer screening. Recommendations will vary depending on your family history and other risks. Hepatitis C blood test. Hepatitis B blood test. Sexually transmitted disease (STD) testing. Diabetes screening. This is done by checking your blood sugar (glucose) after you have not eaten for a while (fasting). You may have this done every 1-3 years. Abdominal aortic aneurysm (AAA) screening. You may need this if you are a current or former smoker. Osteoporosis. You may be screened starting at age 25 if you are at high risk. Talk with your health care provider about your test results, treatment options, and if necessary, the need for more tests. Vaccines  Your health care provider may recommend certain vaccines, such as: Influenza vaccine. This is recommended every year. Tetanus, diphtheria, and acellular pertussis (Tdap, Td) vaccine. You may need a Td booster every 10 years. Zoster vaccine. You may need this after age 48. Pneumococcal 13-valent conjugate (PCV13) vaccine. One dose is recommended after age 47. Pneumococcal polysaccharide (PPSV23) vaccine. One dose is recommended after age 23. Talk to your health care provider  about which screenings and vaccines you need and how often you need them. This  information is not intended to replace advice given to you by your health care provider. Make sure you discuss any questions you have with your health care provider. Document Released: 08/17/2015 Document Revised: 04/09/2016 Document Reviewed: 05/22/2015 Elsevier Interactive Patient Education  2017 [place] Prevention in the Home Falls can cause injuries. They can happen to people of all ages. There are many things you can do to make your home safe and to help prevent falls. What can I do on the outside of my home? Regularly fix the edges of walkways and driveways and fix any cracks. Remove anything that might make you trip as you walk through a door, such as a raised step or threshold. Trim any bushes or trees on the path to your home. Use bright outdoor lighting. Clear any walking paths of anything that might make someone trip, such as rocks or tools. Regularly check to see if handrails are loose or broken. Make sure that both sides of any steps have handrails. Any raised decks and porches should have guardrails on the edges. Have any leaves, snow, or ice cleared regularly. Use sand or salt on walking paths during winter. Clean up any spills in your garage right away. This includes oil or grease spills. What can I do in the bathroom? Use night lights. Install grab bars by the toilet and in the tub and shower. Do not use towel bars as grab bars. Use non-skid mats or decals in the tub or shower. If you need to sit down in the shower, use a plastic, non-slip stool. Keep the floor dry. Clean up any water that spills on the floor as soon as it happens. Remove soap buildup in the tub or shower regularly. Attach bath mats securely with double-sided non-slip rug tape. Do not have throw rugs and other things on the floor that can make you trip. What can I do in the bedroom? Use night lights. Make sure that you have a light by your bed that is easy to reach. Do not use any sheets or  blankets that are too big for your bed. They should not hang down onto the floor. Have a firm chair that has side arms. You can use this for support while you get dressed. Do not have throw rugs and other things on the floor that can make you trip. What can I do in the kitchen? Clean up any spills right away. Avoid walking on wet floors. Keep items that you use a lot in easy-to-reach places. If you need to reach something above you, use a strong step stool that has a grab bar. Keep electrical cords out of the way. Do not use floor polish or wax that makes floors slippery. If you must use wax, use non-skid floor wax. Do not have throw rugs and other things on the floor that can make you trip. What can I do with my stairs? Do not leave any items on the stairs. Make sure that there are handrails on both sides of the stairs and use them. Fix handrails that are broken or loose. Make sure that handrails are as long as the stairways. Check any carpeting to make sure that it is firmly attached to the stairs. Fix any carpet that is loose or worn. Avoid having throw rugs at the top or bottom of the stairs. If you do have throw rugs, attach them to the floor  with carpet tape. Make sure that you have a light switch at the top of the stairs and the bottom of the stairs. If you do not have them, ask someone to add them for you. What else can I do to help prevent falls? Wear shoes that: Do not have high heels. Have rubber bottoms. Are comfortable and fit you well. Are closed at the toe. Do not wear sandals. If you use a stepladder: Make sure that it is fully opened. Do not climb a closed stepladder. Make sure that both sides of the stepladder are locked into place. Ask someone to hold it for you, if possible. Clearly mark and make sure that you can see: Any grab bars or handrails. First and last steps. Where the edge of each step is. Use tools that help you move around (mobility aids) if they are  needed. These include: Canes. Walkers. Scooters. Crutches. Turn on the lights when you go into a dark area. Replace any light bulbs as soon as they burn out. Set up your furniture so you have a clear path. Avoid moving your furniture around. If any of your floors are uneven, fix them. If there are any pets around you, be aware of where they are. Review your medicines with your doctor. Some medicines can make you feel dizzy. This can increase your chance of falling. Ask your doctor what other things that you can do to help prevent falls. This information is not intended to replace advice given to you by your health care provider. Make sure you discuss any questions you have with your health care provider. Document Released: 05/17/2009 Document Revised: 12/27/2015 Document Reviewed: 08/25/2014 Elsevier Interactive Patient Education  2017 [employer].

## 2021-05-08 ENCOUNTER — Other Ambulatory Visit: Payer: Self-pay

## 2021-05-09 ENCOUNTER — Ambulatory Visit (INDEPENDENT_AMBULATORY_CARE_PROVIDER_SITE_OTHER): Payer: Medicare Other | Admitting: Family Medicine

## 2021-05-09 ENCOUNTER — Encounter: Payer: Self-pay | Admitting: Family Medicine

## 2021-05-09 VITALS — BP 116/62 | HR 68 | Temp 98.4°F | Wt 171.0 lb

## 2021-05-09 DIAGNOSIS — H66011 Acute suppurative otitis media with spontaneous rupture of ear drum, right ear: Secondary | ICD-10-CM

## 2021-05-09 DIAGNOSIS — J0140 Acute pansinusitis, unspecified: Secondary | ICD-10-CM | POA: Diagnosis not present

## 2021-05-09 DIAGNOSIS — J302 Other seasonal allergic rhinitis: Secondary | ICD-10-CM

## 2021-05-09 MED ORDER — AMOXICILLIN-POT CLAVULANATE 500-125 MG PO TABS: 1.0000 | ORAL_TABLET | Freq: Two times a day (BID) | ORAL | 0 refills | Status: AC

## 2021-05-09 NOTE — Progress Notes (Signed)
Subjective:    Patient ID: Luis Villegas, male    DOB: 1946-01-17, 75 y.o.   MRN: [id number]  Chief Complaint  Patient presents with   Ear Pain    Stared 3 wks ago, leakage was light brown. Congestion for months, never goes away. Has only taken the Rx sent in from [place]    HPI Patient was seen today for ongoing concern.  Patient seen virtually 04/25/2021 for nasal congestion ear stuffiness and pressure times several weeks.  Some improvement after starting Allegra, but later noticed brownish drainage from right ear and continued facial and dental pressure.  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  Denies fever, chills, nausea, vomiting, sore throat, cough.  Past Medical History:  Diagnosis Date   Allergy    Colon polyps 2016   Depression    GERD (gastroesophageal reflux disease)    Heart murmur    Hyperlipidemia    IBS (irritable bowel syndrome)    Kidney stones    stage 3    Prostate cancer ([place]) 2003   sx 2003 and radation   Sleep apnea    no cpap per pt   Sleep apnea with use of continuous positive airway pressure (CPAP)     Allergies  Allergen Reactions   Lipitor [Atorvastatin Calcium]     myalgias    ROS General: Denies fever, chills, night sweats, changes in weight, changes in appetite HEENT: Denies headaches, changes in vision, rhinorrhea, sore throat  + right ear pain and drainage, facial pressure, dental pressure CV: Denies CP, palpitations, SOB, orthopnea Pulm: Denies SOB, cough, wheezing GI: Denies abdominal pain, nausea, vomiting, diarrhea, constipation GU: Denies dysuria, hematuria, frequency Msk: Denies muscle cramps, joint pains Neuro: Denies weakness, numbness, tingling Skin: Denies rashes, bruising Psych: Denies depression, anxiety, hallucinations     Objective:    Blood pressure 116/62, pulse 68, temperature 98.4 F (36.9 C), temperature source Rectal, weight 171 lb (77.6 kg), SpO2 97 %.  Gen. [place],  well-nourished, in no distress, normal affect  HEENT: [place]/AT, face symmetric, conjunctiva clear, no scleral icterus, PERRLA, EOMI, nares patent without drainage, pharynx without erythema or exudate.  Left external ear, canal, TM normal.  Right TM with suppurative fluid, flat, dull.  No drainage noted in right canal.  No TTP of maxillary, ethmoid, frontal sinuses. Lungs: no accessory muscle use Cardiovascular: RRR, no peripheral edema Musculoskeletal: No deformities, no cyanosis or clubbing, normal tone Neuro:  A&Ox3, CN II-XII intact, normal gait Skin:  Warm, no lesions/ rash   Wt Readings from Last 3 Encounters:  05/09/21 171 lb (77.6 kg)  11/16/20 165 lb (74.8 kg)  08/08/20 175 lb 6.4 oz (79.6 kg)    Lab Results  Component Value Date   WBC 5.0 08/08/2020   HGB 11.7 (L) 08/08/2020   HCT 35.7 (L) 08/08/2020   PLT 234.0 08/08/2020   GLUCOSE 85 08/08/2020   CHOL 149 05/23/2020   TRIG 173 (H) 05/23/2020   HDL 47 05/23/2020   LDLCALC 75 05/23/2020   ALT 20 08/08/2020   AST 17 08/08/2020   NA 139 08/08/2020   K 4.1 08/08/2020   CL 106 08/08/2020   CREATININE 1.47 08/08/2020   BUN 23 08/08/2020   CO2 26 08/08/2020   TSH 0.76 08/08/2020   PSA 2.95 10/26/2020   HGBA1C 6.1 12/23/2017    Assessment/Plan:  Acute suppurative otitis media of right ear with spontaneous rupture of tympanic membrane, recurrence not specified -Discussed supportive care including  Tylenol or NSAIDs as needed for pain/discomfort -We will start Augmentin -Given handout -Given precautions for continued or worsening symptoms - Plan: amoxicillin-clavulanate (AUGMENTIN) 500-125 MG tablet  Subacute pansinusitis -Okay to continue Allegra -Consider saline nasal rinse or Flonase for continued or increased symptoms  - Plan: amoxicillin-clavulanate (AUGMENTIN) 500-125 MG tablet  Seasonal allergies -Continue Allegra as needed -For continued or worsening symptoms consider saline nasal rinse or Flonase  F/u as  needed  Grier Mitts, MD

## 2021-05-20 DIAGNOSIS — J310 Chronic rhinitis: Secondary | ICD-10-CM | POA: Diagnosis not present

## 2021-05-20 DIAGNOSIS — H6522 Chronic serous otitis media, left ear: Secondary | ICD-10-CM | POA: Diagnosis not present

## 2021-05-20 DIAGNOSIS — J342 Deviated nasal septum: Secondary | ICD-10-CM | POA: Diagnosis not present

## 2021-05-20 DIAGNOSIS — H903 Sensorineural hearing loss, bilateral: Secondary | ICD-10-CM | POA: Diagnosis not present

## 2021-05-20 DIAGNOSIS — J343 Hypertrophy of nasal turbinates: Secondary | ICD-10-CM | POA: Diagnosis not present

## 2021-05-20 DIAGNOSIS — H6982 Other specified disorders of Eustachian tube, left ear: Secondary | ICD-10-CM | POA: Diagnosis not present

## 2021-05-20 DIAGNOSIS — H838X3 Other specified diseases of inner ear, bilateral: Secondary | ICD-10-CM | POA: Diagnosis not present

## 2021-06-19 DIAGNOSIS — C61 Malignant neoplasm of prostate: Secondary | ICD-10-CM | POA: Diagnosis not present

## 2021-07-03 DIAGNOSIS — H9011 Conductive hearing loss, unilateral, right ear, with unrestricted hearing on the contralateral side: Secondary | ICD-10-CM | POA: Diagnosis not present

## 2021-07-03 DIAGNOSIS — J310 Chronic rhinitis: Secondary | ICD-10-CM | POA: Diagnosis not present

## 2021-07-03 DIAGNOSIS — J343 Hypertrophy of nasal turbinates: Secondary | ICD-10-CM | POA: Diagnosis not present

## 2021-07-03 DIAGNOSIS — H6981 Other specified disorders of Eustachian tube, right ear: Secondary | ICD-10-CM | POA: Diagnosis not present

## 2021-07-03 DIAGNOSIS — H6521 Chronic serous otitis media, right ear: Secondary | ICD-10-CM | POA: Diagnosis not present

## 2021-07-03 DIAGNOSIS — J342 Deviated nasal septum: Secondary | ICD-10-CM | POA: Diagnosis not present

## 2021-07-05 ENCOUNTER — Other Ambulatory Visit: Payer: Self-pay | Admitting: Family Medicine

## 2021-07-15 DIAGNOSIS — H524 Presbyopia: Secondary | ICD-10-CM | POA: Diagnosis not present

## 2021-07-15 DIAGNOSIS — H16223 Keratoconjunctivitis sicca, not specified as Sjogren's, bilateral: Secondary | ICD-10-CM | POA: Diagnosis not present

## 2021-07-15 DIAGNOSIS — Z961 Presence of intraocular lens: Secondary | ICD-10-CM | POA: Diagnosis not present

## 2021-07-15 DIAGNOSIS — H02821 Cysts of right upper eyelid: Secondary | ICD-10-CM | POA: Diagnosis not present

## 2021-07-31 DIAGNOSIS — H6121 Impacted cerumen, right ear: Secondary | ICD-10-CM | POA: Diagnosis not present

## 2021-07-31 DIAGNOSIS — J310 Chronic rhinitis: Secondary | ICD-10-CM | POA: Diagnosis not present

## 2021-07-31 DIAGNOSIS — J343 Hypertrophy of nasal turbinates: Secondary | ICD-10-CM | POA: Diagnosis not present

## 2021-07-31 DIAGNOSIS — J342 Deviated nasal septum: Secondary | ICD-10-CM | POA: Diagnosis not present

## 2021-07-31 DIAGNOSIS — H903 Sensorineural hearing loss, bilateral: Secondary | ICD-10-CM | POA: Diagnosis not present

## 2021-07-31 DIAGNOSIS — H6981 Other specified disorders of Eustachian tube, right ear: Secondary | ICD-10-CM | POA: Diagnosis not present

## 2021-07-31 DIAGNOSIS — H838X3 Other specified diseases of inner ear, bilateral: Secondary | ICD-10-CM | POA: Diagnosis not present

## 2021-08-19 ENCOUNTER — Ambulatory Visit (INDEPENDENT_AMBULATORY_CARE_PROVIDER_SITE_OTHER): Payer: Medicare Other | Admitting: Family Medicine

## 2021-08-19 ENCOUNTER — Encounter: Payer: Self-pay | Admitting: Family Medicine

## 2021-08-19 VITALS — BP 116/58 | HR 73 | Temp 98.3°F | Wt 169.6 lb

## 2021-08-19 DIAGNOSIS — G4733 Obstructive sleep apnea (adult) (pediatric): Secondary | ICD-10-CM

## 2021-08-19 DIAGNOSIS — F32A Depression, unspecified: Secondary | ICD-10-CM

## 2021-08-19 DIAGNOSIS — R0683 Snoring: Secondary | ICD-10-CM | POA: Diagnosis not present

## 2021-08-19 DIAGNOSIS — J309 Allergic rhinitis, unspecified: Secondary | ICD-10-CM | POA: Diagnosis not present

## 2021-08-19 DIAGNOSIS — F419 Anxiety disorder, unspecified: Secondary | ICD-10-CM

## 2021-08-19 MED ORDER — MONTELUKAST SODIUM 10 MG PO TABS: 10.0000 mg | ORAL_TABLET | Freq: Every day | ORAL | 3 refills | Status: DC

## 2021-08-19 NOTE — Progress Notes (Signed)
Subjective:    Patient ID: Luis Villegas, male    DOB: [birth year], 76 y.o.   MRN: [id number]  Chief Complaint  Patient presents with   Sleep Apnea    Wants to discuss his sleep studies and sleep mask. Was supposed to have surgery on nose but canceled due to concerns about sleep apnea    HPI Patient was seen today for ongoing concerns.  Pt was to have nasal surgery with ENT tomorrow but cancelled the appt. Endorses concerns about anesthesiology having to use an ET tube during sedation.  Patient states the last time he was intubated several teeth cracked and they had difficulty positioning his head 2/2 history of spinal surgery with hardware in place.  Patient endorses using saline nasal rinse, Flonase for sinus/allergy symptoms without relief.  Patient endorses nasal congestion, sneezing, itchy and watery eyes throughout the day.  Unsure if Allegra helped symptoms.  Patient also inquires about sleep study as told he snores at night and has gasping at times.  In the past diagnosed with OSA.  Could not use mask as felt like it was smothering him.  Tried different mask in the past without improvement.  Inquires about weaning off any medications are not needed.  Past Medical History:  Diagnosis Date   Allergy    Colon polyps 2016   Depression    GERD (gastroesophageal reflux disease)    Heart murmur    Hyperlipidemia    IBS (irritable bowel syndrome)    Kidney stones    stage 3    Prostate cancer ([place]) 2003   sx 2003 and radation   Sleep apnea    no cpap per pt   Sleep apnea with use of continuous positive airway pressure (CPAP)     Allergies  Allergen Reactions   Lipitor [Atorvastatin Calcium]     myalgias    ROS General: Denies fever, chills, night sweats, changes in weight, changes in appetite HEENT: Denies headaches, ear pain, changes in vision, sore throat + rhinorrhea, itchy, watery eyes, nose feeling stuffed up CV: Denies CP, palpitations, SOB, orthopnea Pulm:  Denies SOB, cough, wheezing GI: Denies abdominal pain, nausea, vomiting, diarrhea, constipation GU: Denies dysuria, hematuria, frequenc Msk: Denies muscle cramps, joint pains Neuro: Denies weakness, numbness, tingling Skin: Denies rashes, bruising Psych: Denies depression, anxiety, hallucinations  Objective:    Blood pressure (!) 116/58, pulse 73, temperature 98.3 F (36.8 C), temperature source Oral, weight 169 lb 9.6 oz (76.9 kg), SpO2 97 %.  Gen. Pleasant, well-nourished, in no distress, normal affect   HEENT: [place]/AT, face symmetric, conjunctiva clear, no scleral icterus, PERRLA, EOMI, nares patent without drainage left nares more narrow than right, pharynx without erythema or exudate. Lungs: no accessory muscle use, CTAB, no wheezes or rales Cardiovascular: RRR, no m/r/g, no peripheral edema Musculoskeletal: No deformities, no cyanosis or clubbing, normal tone Neuro:  A&Ox3, CN II-XII intact, normal gait Skin:  Warm, no lesions/ rash  Wt Readings from Last 3 Encounters:  08/19/21 169 lb 9.6 oz (76.9 kg)  05/09/21 171 lb (77.6 kg)  11/16/20 165 lb (74.8 kg)    Lab Results  Component Value Date   WBC 5.0 08/08/2020   HGB 11.7 (L) 08/08/2020   HCT 35.7 (L) 08/08/2020   PLT 234.0 08/08/2020   GLUCOSE 85 08/08/2020   CHOL 149 05/23/2020   TRIG 173 (H) 05/23/2020   HDL 47 05/23/2020   LDLCALC 75 05/23/2020   ALT 20 08/08/2020   AST 17 08/08/2020  NA 139 08/08/2020   K 4.1 08/08/2020   CL 106 08/08/2020   CREATININE 1.47 08/08/2020   BUN 23 08/08/2020   CO2 26 08/08/2020   TSH 0.76 08/08/2020   PSA 2.95 10/26/2020   HGBA1C 6.1 12/23/2017   Depression screen PHQ 2/9 08/19/2021 05/03/2021 05/03/2021  Decreased Interest 1 0 0  Down, Depressed, Hopeless 0 0 0  PHQ - 2 Score 1 0 0  Altered sleeping 1 - -  Tired, decreased energy 1 - -  Change in appetite 1 - -  Feeling bad or failure about yourself  0 - -  Trouble concentrating 0 - -  Moving slowly or fidgety/restless  0 - -  Suicidal thoughts 0 - -  PHQ-9 Score 4 - -  Difficult doing work/chores - - -    Assessment/Plan:  Snoring -Increasing - Plan: Ambulatory referral to Sleep Studies  OSA (obstructive sleep apnea) -Unable to wear CPAP 2/2 feeling like it smothering him -Last sleep study over 5 years ago. - Plan: Ambulatory referral to Sleep Studies  Allergic rhinitis, unspecified seasonality, unspecified trigger -Uncontrolled -Continue Flonase, saline nasal rinse -Will start Singulair -Continue follow-up with ENT  - Plan: montelukast (SINGULAIR) 10 MG tablet  Anxiety and depression -Stable -Continue Celexa 20 mg daily and Wellbutrin XL 150 mg daily -Consider weaning Celexa as patient interested in decreasing the amount of meds he is on.  Discussed waiting until allergy symptoms have improved.  F/u as needed in 1 month.    Grier Mitts, MD

## 2021-08-19 NOTE — Patient Instructions (Signed)
A prescription for Singulair, a medication that can help with allergy symptoms was sent to your pharmacy.  You can still use the saline nasal rinse and Flonase with this medication.  Referral for sleep study was placed.  You should receive a phone call about scheduling this.

## 2021-08-28 ENCOUNTER — Encounter: Payer: Self-pay | Admitting: Family Medicine

## 2021-09-04 DIAGNOSIS — Z20822 Contact with and (suspected) exposure to covid-19: Secondary | ICD-10-CM | POA: Diagnosis not present

## 2021-10-03 ENCOUNTER — Other Ambulatory Visit: Payer: Self-pay | Admitting: Family Medicine

## 2021-10-07 ENCOUNTER — Other Ambulatory Visit: Payer: Self-pay

## 2021-10-07 ENCOUNTER — Encounter: Payer: Self-pay | Admitting: Adult Health

## 2021-10-07 ENCOUNTER — Ambulatory Visit (INDEPENDENT_AMBULATORY_CARE_PROVIDER_SITE_OTHER): Payer: Medicare Other | Admitting: Adult Health

## 2021-10-07 DIAGNOSIS — G4733 Obstructive sleep apnea (adult) (pediatric): Secondary | ICD-10-CM | POA: Diagnosis not present

## 2021-10-07 NOTE — Addendum Note (Signed)
Addended by: Vanessa Barbara on: 10/07/2021 11:23 AM ? ? Modules accepted: Orders ? ?

## 2021-10-07 NOTE — Assessment & Plan Note (Signed)
Obstructive sleep apnea history.  Patient has ongoing snoring, daytime sleepiness, witnessed apneic events all suspicious for ongoing sleep apnea.  Patient be set up for home sleep study.  Patient education was given ? ?- discussed how weight can impact sleep and risk for sleep disordered breathing ?- discussed options to assist with weight loss: combination of diet modification, cardiovascular and strength training exercises ?  ?- had an extensive discussion regarding the adverse health consequences related to untreated sleep disordered breathing ?- specifically discussed the risks for hypertension, coronary artery disease, cardiac dysrhythmias, cerebrovascular disease, and diabetes ?- lifestyle modification discussed ?  ?- discussed how sleep disruption can increase risk of accidents, particularly when driving ?- safe driving practices were discussed ?  ?Plan  ?Patient Instructions  ?Set up for home sleep study ?Healthy sleep regimen ?Do not drive if sleepy ?Continue to work on healthy weight ?Follow-up in 6 to 8 weeks to discuss results and treatment options ? ?  ? ?

## 2021-10-07 NOTE — Patient Instructions (Signed)
Set up for home sleep study ?Healthy sleep regimen ?Do not drive if sleepy ?Continue to work on healthy weight ?Follow-up in 6 to 8 weeks to discuss results and treatment options ? ?

## 2021-10-07 NOTE — Progress Notes (Signed)
@Patient  ID: Luis Villegas, male    DOB: 11/07/1945, 76 y.o.   MRN: [id number]  Chief Complaint  Patient presents with   Consult    Referring provider: Deeann Saint, MD  HPI: 76 year old male presents for sleep consult October 07, 2021 to establish for sleep apnea Patient says he was diagnosed with sleep apnea around 2011 but was unable to tolerate.  TEST/EVENTS :   10/07/2021 Sleep consult  Patient presents for sleep consult today.  Patient says he was diagnosed with sleep apnea about 10 to 12 years ago.  He was started on CPAP but was unable to tolerate.  Tried several mask before, could not tolerate. Patient complains he continues to snore have daytime sleepiness and gasp in his sleep.  Patient was referred over by his primary care for further evaluation.  Patient says he typically goes to bed about 10 to 10:30 PM.  Only takes about 5 to 10 minutes to go to sleep.  Is up 3-4 times each night.  Gets up about 9 AM.  Patient is retired does not operate heavy machinery.  Weight is down 13lbs  pounds over the last 2 years. [place] 11. Takes nap daily for 2hrs. get sleepy if he is an active predominantly sitting still watching TV and definitely get sleepy in the afternoon hours Patient denies any symptoms suspicious for cataplexy or sleep paralysis  Caffeine intake 2 cups of coffee .  No history of congestive heart failure or stroke Does not exercise. Inactive.  Patient says another reason he came for sleep consult was that he was supposed to get sinus surgery but was told by anesthesiologist he need to have his sleep apnea worked up..  Medical history significant for emphysema (noted on PET scan ) , stage III kidney disease, chronic allergies and sinus disease, GERD, migraine, hyperlipidemia , history of prostate cancer status post prostatectomy  Surgical history cervical fusion surgery in 2003.  Previous tonsillectomy.  Status post prostatectomy. Left rotator cuff surgery .     Social history patient is married.  Lives at home with his wife.  He is retiredMuseum/gallery exhibitions officer supplies  He has adult children.  Patient is a former smoker.  Quit in 1995.Marland Kitchen  No alcohol.   Allergies  Allergen Reactions   Lipitor [Atorvastatin Calcium]     myalgias    Immunization History  Administered Date(s) Administered   Fluad Quad(high Dose 65+) 04/18/2020, 04/11/2021   Influenza, High Dose Seasonal PF 04/07/2018, 03/25/2019, 03/25/2019   Influenza-Unspecified 03/25/2019   PFIZER(Purple Top)SARS-COV-2 Vaccination 09/18/2019, 10/11/2019, 05/11/2020   Td 04/12/2008   Tdap 05/04/2018   Zoster, Live 01/25/2010    Past Medical History:  Diagnosis Date   Allergy    Colon polyps 2016   Depression    GERD (gastroesophageal reflux disease)    Heart murmur    Hyperlipidemia    IBS (irritable bowel syndrome)    Kidney stones    stage 3    Prostate cancer ([facility]) 2003   sx 2003 and radation   Sleep apnea    no cpap per pt   Sleep apnea with use of continuous positive airway pressure (CPAP)     Tobacco History: Social History   Tobacco Use  Smoking Status Former   Types: Cigarettes   Quit date: 08/04/1993   Years since quitting: 28.1  Smokeless Tobacco Never   Counseling given: Not Answered   Outpatient Medications Prior to Visit  Medication Sig Dispense Refill   acetaminophen (TYLENOL)  500 MG tablet Take 500 mg by mouth every 6 (six) hours as needed.     acyclovir (ZOVIRAX) 200 MG capsule TAKE 1 CAPSULE(200 MG) BY MOUTH DAILY 90 capsule 3   buPROPion (WELLBUTRIN XL) 150 MG 24 hr tablet TAKE 1 TABLET(150 MG) BY MOUTH DAILY 90 tablet 0   citalopram (CELEXA) 20 MG tablet TAKE 1 TABLET(20 MG) BY MOUTH DAILY 90 tablet 0   ezetimibe-simvastatin (VYTORIN) 10-40 MG tablet TAKE 1 TABLET BY MOUTH AT BEDTIME 90 tablet 3   fexofenadine (ALLEGRA) 180 MG tablet Take 1 tablet (180 mg total) by mouth daily. 30 tablet 1   fluticasone (FLONASE) 50 MCG/ACT nasal spray Place into both  nostrils.     gabapentin (NEURONTIN) 600 MG tablet Take 0.5 tablets (300 mg total) by mouth 3 (three) times daily. 15 tablet 0   methocarbamol (ROBAXIN) 500 MG tablet Take 1 tablet (500 mg total) by mouth every 8 (eight) hours as needed. 30 tablet 0   montelukast (SINGULAIR) 10 MG tablet Take 1 tablet (10 mg total) by mouth at bedtime. For allergy symptoms. 30 tablet 3   Multiple Vitamin (MULTIVITAMIN ADULT PO) Take by mouth.     omeprazole (PRILOSEC) 20 MG capsule TAKE 1 CAPSULE BY MOUTH EVERY DAY 90 capsule 1   solifenacin (VESICARE) 10 MG tablet Take 10 mg by mouth daily.     SUMAtriptan (IMITREX) 100 MG tablet TAKE 1 TABLET BY MOUTH AS NEEDED FOR HEADACHE 9 tablet 6   tamsulosin (FLOMAX) 0.4 MG CAPS capsule Take 0.4 mg by mouth daily.     topiramate (TOPAMAX) 200 MG tablet TAKE 1 TABLET(200 MG) BY MOUTH AT BEDTIME 90 tablet 1   triamcinolone (KENALOG) 0.025 % cream Apply 1 application topically 2 (two) times daily. 30 g 0   Omega-3 Fatty Acids (FISH OIL PO) Take by mouth. (Patient not taking: Reported on 08/19/2021)     No facility-administered medications prior to visit.     Review of Systems:   Constitutional:   No  weight loss, night sweats,  Fevers, chills,  +fatigue, or  lassitude.  HEENT:   No headaches,  Difficulty swallowing,  Tooth/dental problems, or  Sore throat,                No sneezing, itching, ear ache, nasal congestion, post nasal drip,   CV:  No chest pain,  Orthopnea, PND, swelling in lower extremities, anasarca, dizziness, palpitations, syncope.   GI  No heartburn, indigestion, abdominal pain, nausea, vomiting, diarrhea, change in bowel habits, loss of appetite, bloody stools.   Resp: No shortness of breath with exertion or at rest.  No excess mucus, no productive cough,  No non-productive cough,  No coughing up of blood.  No change in color of mucus.  No wheezing.  No chest wall deformity  Skin: no rash or lesions.  GU: no dysuria, change in color of urine,  no urgency or frequency.  No flank pain, no hematuria   MS:  No joint pain or swelling.  No decreased range of motion.  No back pain.    Physical Exam  BP 110/60 (BP Location: Left Arm, Patient Position: Sitting, Cuff Size: Normal)   Pulse 79   Temp 98.1 F (36.7 C) (Oral)   Ht 5\' 8"  (1.727 m)   Wt 166 lb (75.3 kg)   SpO2 96%   BMI 25.24 kg/m   GEN: A/Ox3; pleasant , NAD, well nourished    HEENT:  [place]/AT,   NOSE-clear, THROAT-clear, no  lesions, no postnasal drip or exudate noted.  Class III MP airway  NECK:  Supple w/ fair ROM; no JVD; normal carotid impulses w/o bruits; no thyromegaly or nodules palpated; no lymphadenopathy.    RESP  Clear  P & A; w/o, wheezes/ rales/ or rhonchi. no accessory muscle use, no dullness to percussion  CARD:  RRR, no m/r/g, no peripheral edema, pulses intact, no cyanosis or clubbing.  GI:   Soft & nt; nml bowel sounds; no organomegaly or masses detected.   Musco: Warm bil, no deformities or joint swelling noted.   Neuro: alert, no focal deficits noted.    Skin: Warm, no lesions or rashes    Lab Results:  CBC   BMET   BNP No results found for: BNP  ProBNP No results found for: PROBNP  Imaging: No results found.    No flowsheet data found.  No results found for: NITRICOXIDE      Assessment & Plan:   Sleep apnea Obstructive sleep apnea history.  Patient has ongoing snoring, daytime sleepiness, witnessed apneic events all suspicious for ongoing sleep apnea.  Patient be set up for home sleep study.  Patient education was given  - discussed how weight can impact sleep and risk for sleep disordered breathing - discussed options to assist with weight loss: combination of diet modification, cardiovascular and strength training exercises   - had an extensive discussion regarding the adverse health consequences related to untreated sleep disordered breathing - specifically discussed the risks for hypertension, coronary artery  disease, cardiac dysrhythmias, cerebrovascular disease, and diabetes - lifestyle modification discussed   - discussed how sleep disruption can increase risk of accidents, particularly when driving - safe driving practices were discussed   Plan  Patient Instructions  Set up for home sleep study Healthy sleep regimen Do not drive if sleepy Continue to work on healthy weight Follow-up in 6 to 8 weeks to discuss results and treatment options        Rubye Oaks, NP 10/07/2021

## 2021-10-28 DIAGNOSIS — N200 Calculus of kidney: Secondary | ICD-10-CM | POA: Diagnosis not present

## 2021-10-28 DIAGNOSIS — N3941 Urge incontinence: Secondary | ICD-10-CM | POA: Diagnosis not present

## 2021-10-28 DIAGNOSIS — C61 Malignant neoplasm of prostate: Secondary | ICD-10-CM | POA: Diagnosis not present

## 2021-11-24 ENCOUNTER — Other Ambulatory Visit: Payer: Self-pay | Admitting: Family Medicine

## 2021-11-24 DIAGNOSIS — J309 Allergic rhinitis, unspecified: Secondary | ICD-10-CM

## 2021-11-25 DIAGNOSIS — Z20822 Contact with and (suspected) exposure to covid-19: Secondary | ICD-10-CM | POA: Diagnosis not present

## 2021-12-02 ENCOUNTER — Ambulatory Visit (INDEPENDENT_AMBULATORY_CARE_PROVIDER_SITE_OTHER): Payer: Medicare Other | Admitting: Dermatology

## 2021-12-02 DIAGNOSIS — L738 Other specified follicular disorders: Secondary | ICD-10-CM

## 2021-12-02 DIAGNOSIS — L570 Actinic keratosis: Secondary | ICD-10-CM

## 2021-12-02 DIAGNOSIS — Z85828 Personal history of other malignant neoplasm of skin: Secondary | ICD-10-CM | POA: Diagnosis not present

## 2021-12-02 DIAGNOSIS — R238 Other skin changes: Secondary | ICD-10-CM

## 2021-12-02 DIAGNOSIS — D0462 Carcinoma in situ of skin of left upper limb, including shoulder: Secondary | ICD-10-CM | POA: Diagnosis not present

## 2021-12-02 DIAGNOSIS — Z1283 Encounter for screening for malignant neoplasm of skin: Secondary | ICD-10-CM

## 2021-12-02 DIAGNOSIS — L821 Other seborrheic keratosis: Secondary | ICD-10-CM

## 2021-12-02 DIAGNOSIS — D485 Neoplasm of uncertain behavior of skin: Secondary | ICD-10-CM

## 2021-12-04 ENCOUNTER — Ambulatory Visit: Payer: Medicare Other | Admitting: Adult Health

## 2021-12-11 DIAGNOSIS — Z20822 Contact with and (suspected) exposure to covid-19: Secondary | ICD-10-CM | POA: Diagnosis not present

## 2021-12-18 ENCOUNTER — Encounter: Payer: Self-pay | Admitting: Dermatology

## 2021-12-18 NOTE — Progress Notes (Signed)
? ?Follow-Up Visit ?  ?Subjective  ?Luis Villegas is a 76 y.o. male who presents for the following: Annual Exam (Here for annuals akin exam. Concerns left inner knee. Also check right side of eye area. History of non mole skin cancers. ). ? ?Annual skin check, crusts on temple and hand, check moles ?Location:  ?Duration:  ?Quality:  ?Associated Signs/Symptoms: ?Modifying Factors:  ?Severity:  ?Timing: ?Context:  ? ?Objective  ?Well appearing patient in no apparent distress; mood and affect are within normal limits. ?Full body skin examination no atypical pigmented lesions (all checked with dermoscopy.  1 possible new superficial carcinoma left hand will be biopsied and treated.  No recurrent nonmelanoma skin cancer. ? ? ? ? ?Generalized, Right Knee - Anterior ?3 cm flat tan subtly textured lesion compatible with large stucco keratosis, medial border with some erythema that I suspect is inflammatory rather than carcinoma in situ ? ?Left Scaphoid Fossa ?3 mm soft Blue dermal papule that probably compresses ? ?Right Conjunctiva ?Upper eyelid 2 mm flesh-colored papule with eccentric dell ? ?Right Temple ?Gritty 7 mm pink crust ? ?Left Dorsal Hand ?1 cm waxy crust compatible with superficial carcinoma ? ? ? ? ? ? ? ? ?A full examination was performed including scalp, head, eyes, ears, nose, lips, neck, chest, axillae, abdomen, back, buttocks, bilateral upper extremities, bilateral lower extremities, hands, feet, fingers, toes, fingernails, and toenails. All findings within normal limits unless otherwise noted below. ? ? ?Assessment & Plan  ? ? ?Encounter for screening for malignant neoplasm of skin ? ?Annual skin examination ? ?Seborrheic keratosis (2) ?Right Knee - Anterior; Generalized ? ?Return if there is clinical change. ? ?Venous lake ?Left Scaphoid Fossa ? ?No intervention indicated ? ?Sebaceous hyperplasia of face ?Right Conjunctiva ? ?Told of similar appearance of early [place] so if there is growth or  bleeding return for biopsy ? ?AK (actinic keratosis) ?Right Temple ? ?Return for possible biopsy if freezing fails ? ?Destruction of lesion - Right Temple ?Complexity: simple   ?Destruction method: cryotherapy   ?Informed consent: discussed and consent obtained   ?Timeout:  patient name, date of birth, surgical site, and procedure verified ?Lesion destroyed using liquid nitrogen: Yes   ?Cryotherapy cycles:  3 ?Outcome: patient tolerated procedure well with no complications   ? 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 ?Skin / nail biopsy ?Type of biopsy: tangential   ?Informed consent: discussed and consent obtained   ?Timeout: patient name, date of birth, surgical site, and procedure verified   ?Procedure prep:  Patient was prepped and draped in usual sterile fashion (Non sterile) ?Prep type:  Chlorhexidine ?Anesthesia: the lesion was anesthetized in a standard fashion   ?Anesthetic:  1% lidocaine w/ epinephrine 1-100,000 local infiltration ?Instrument used: flexible razor blade   ?Outcome: patient tolerated procedure well   ?Post-procedure details: wound care instructions given   ? ?Destruction of lesion ?Complexity: simple   ?Destruction method: electrodesiccation and curettage   ?Informed consent: discussed and consent obtained   ?Timeout:  patient name, date of birth, surgical site, and procedure verified ?Anesthesia: the lesion was anesthetized in a standard fashion   ?Anesthetic:  1% lidocaine w/ epinephrine 1-100,000 local infiltration ?Curettage performed in three different directions: Yes   ?Electrodesiccation performed over the curetted area: Yes   ?Curettage cycles:  3 ?Lesion length (cm):  1 ?Lesion width (cm):  1 ?Margin per side (cm):  0 ?Final wound size (cm):  1 ?Hemostasis achieved  with:  aluminum chloride ?Outcome: patient tolerated procedure well with no complications   ?Post-procedure details: wound care instructions given   ? ?Specimen 1 - Surgical  pathology ?Differential Diagnosis: bcc vs [id number] ? ?Check Margins: No ? ?After shave biopsy the base was treated with curettage plus cautery ? ? ? ? ? ?I, Lavonna Monarch, MD, have reviewed all documentation for this visit.  The documentation on 12/18/21 for the exam, diagnosis, procedures, and orders are all accurate and complete. ?

## 2021-12-26 ENCOUNTER — Ambulatory Visit (INDEPENDENT_AMBULATORY_CARE_PROVIDER_SITE_OTHER): Payer: Medicare Other | Admitting: Family Medicine

## 2021-12-26 VITALS — BP 110/62 | HR 64 | Temp 98.8°F | Wt 166.4 lb

## 2021-12-26 DIAGNOSIS — H6983 Other specified disorders of Eustachian tube, bilateral: Secondary | ICD-10-CM | POA: Diagnosis not present

## 2021-12-26 DIAGNOSIS — H6591 Unspecified nonsuppurative otitis media, right ear: Secondary | ICD-10-CM | POA: Diagnosis not present

## 2021-12-26 DIAGNOSIS — H9193 Unspecified hearing loss, bilateral: Secondary | ICD-10-CM | POA: Diagnosis not present

## 2021-12-26 DIAGNOSIS — H7291 Unspecified perforation of tympanic membrane, right ear: Secondary | ICD-10-CM

## 2021-12-26 DIAGNOSIS — K627 Radiation proctitis: Secondary | ICD-10-CM | POA: Diagnosis not present

## 2021-12-26 MED ORDER — AMOXICILLIN-POT CLAVULANATE 500-125 MG PO TABS: 1.0000 | ORAL_TABLET | Freq: Two times a day (BID) | ORAL | 0 refills | Status: AC

## 2021-12-26 NOTE — Progress Notes (Signed)
Subjective:    Patient ID: Luis Villegas, male    DOB: 08-24-1945, 76 y.o.   MRN: [id number]  Chief Complaint  Patient presents with   Ear Pain    Mainly right ear, they both feel stopped up. 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  Pt notes b/l ears feel clogged, like they need to pop.  R ear worse than L.  Pt unable to wear hearing aids 2/2 the sound of the interference being so loud.  Pt may wear one hearing aid when watching tv or at church.  Pt notes h/o XRT for prostate cancer over 10 yrs ago.  Having loose stools 2/2 XRT proctitis.  Had to take 2 imodium tablets in order to make it to appt this am.  Pt states when he takes imodium it causes constipation for a few days.  Endorses a few episodes of stool leakage while out.  Pt denies blood in stools.  Pt requesting referral to Dr. Harrell Gave While at [facility].  Past Medical History:  Diagnosis Date   Allergy    Colon polyps 2016   Depression    GERD (gastroesophageal reflux disease)    Heart murmur    Hyperlipidemia    IBS (irritable bowel syndrome)    Kidney stones    stage 3    Prostate cancer ([place]) 2003   sx 2003 and radation   Sleep apnea    no cpap per pt   Sleep apnea with use of continuous positive airway pressure (CPAP)     Allergies  Allergen Reactions   Lipitor [Atorvastatin Calcium]     myalgias    ROS General: Denies fever, chills, night sweats, changes in weight, changes in appetite HEENT: Denies headaches, ear pain, changes in vision, rhinorrhea, sore throat +b/l ears stopped up, decreased hearing CV: Denies CP, palpitations, SOB, orthopnea Pulm: Denies SOB, cough, wheezing GI: Denies abdominal pain, nausea, vomiting, diarrhea, constipation  +loose stools GU: Denies dysuria, hematuria,  frequency Msk: Denies muscle cramps, joint pains Neuro: Denies weakness, numbness, tingling Skin: Denies rashes, bruising Psych: Denies depression, anxiety, hallucinations     Objective:    Blood pressure 110/62, pulse 64, temperature 98.8 F (37.1 C), temperature source Oral, weight 166 lb 6.4 oz (75.5 kg), SpO2 96 %.  Gen. Pleasant, well-nourished, in no distress, normal affect   HEENT: [place]/AT, face symmetric, conjunctiva clear, no scleral icterus, PERRLA, EOMI, nares patent without drainage, pharynx without erythema or exudate.  B/l hearing aids in place.  R TM with small perforation erythema and scant fluid in canal.  L TM full without erythema. Lungs: no accessory muscle use, CTAB, no wheezes or rales Cardiovascular: RRR, no m/r/g, no peripheral edema Abdomen: BS present, soft, NT/ND, no hepatosplenomegaly. Musculoskeletal: No deformities, no cyanosis or clubbing, normal tone Neuro:  A&Ox3, CN II-XII intact, normal gait Skin:  Warm, no lesions/ rash   Wt Readings from Last 3 Encounters:  12/26/21 166 lb 6.4 oz (75.5 kg)  10/07/21 166 lb (75.3 kg)  08/19/21 169 lb 9.6 oz (76.9 kg)    Lab Results  Component Value Date   WBC 5.0 08/08/2020   HGB 11.7 (L) 08/08/2020   HCT 35.7 (L) 08/08/2020   PLT 234.0 08/08/2020   GLUCOSE 85 08/08/2020  CHOL 149 05/23/2020   TRIG 173 (H) 05/23/2020   HDL 47 05/23/2020   LDLCALC 75 05/23/2020   ALT 20 08/08/2020   AST 17 08/08/2020   NA 139 08/08/2020   K 4.1 08/08/2020   CL 106 08/08/2020   CREATININE 1.47 08/08/2020   BUN 23 08/08/2020   CO2 26 08/08/2020   TSH 0.76 08/08/2020   PSA 2.95 10/26/2020   HGBA1C 6.1 12/23/2017    Assessment/Plan:  Chronic radiation proctitis  -s/p XRT for h/o prostate cancer 10 +yrs ago -increasing frequency of loose stools -consider 1/2 tab OTC Imodium as whole tab causes constipation. -supportive care - Plan: Ambulatory referral to General Surgery  Otitis media, serous, tm rupture, right   -start abx -avoid getting water in R ear. -discussed likely duration of symptoms/time for TM to heal -Given handout -Continue to monitor - Plan: amoxicillin-clavulanate (AUGMENTIN) 500-125 MG tablet  Decreased hearing of both ears -Wears hearing aids 2/2 baseline decreased hearing.   -Likely worsened by current R otitis media with TM rupture -continue f/u with audiology  Dysfunction of both eustachian tubes -OTC antihistamine prn -given handout  F/u prn  Grier Mitts, MD

## 2022-01-01 ENCOUNTER — Other Ambulatory Visit: Payer: Self-pay | Admitting: Family Medicine

## 2022-01-06 ENCOUNTER — Encounter: Payer: Self-pay | Admitting: Family Medicine

## 2022-01-07 ENCOUNTER — Encounter: Payer: Self-pay | Admitting: Family Medicine

## 2022-01-31 ENCOUNTER — Encounter: Payer: Self-pay | Admitting: Adult Health

## 2022-02-05 ENCOUNTER — Ambulatory Visit (INDEPENDENT_AMBULATORY_CARE_PROVIDER_SITE_OTHER): Payer: Medicare Other | Admitting: Orthopedic Surgery

## 2022-02-05 ENCOUNTER — Ambulatory Visit: Payer: Self-pay

## 2022-02-05 ENCOUNTER — Ambulatory Visit (INDEPENDENT_AMBULATORY_CARE_PROVIDER_SITE_OTHER): Payer: Medicare Other

## 2022-02-05 ENCOUNTER — Encounter: Payer: Self-pay | Admitting: Orthopedic Surgery

## 2022-02-05 DIAGNOSIS — M79641 Pain in right hand: Secondary | ICD-10-CM | POA: Diagnosis not present

## 2022-02-05 DIAGNOSIS — M654 Radial styloid tenosynovitis [de Quervain]: Secondary | ICD-10-CM

## 2022-02-05 NOTE — Progress Notes (Unsigned)
Office Visit Note   Patient: Luis Villegas           Date of Birth: [birth year]           MRN: [id number] Visit Date: 02/05/2022 Requested by: Billie Ruddy, MD [place],  [place] [address] PCP: Billie Ruddy, MD  Subjective: Chief Complaint  Patient presents with   Right Hand - Pain   Wrist Pain    HPI: Luis Villegas is a 76 y.o. male who presents to the office complaining of right hand and wrist pain.  Patient states that about 2 months ago, he was using a manual screwdriver driver to fix something in his garage.  He had to torque the screwdriver with a lot of force and about a day later, noticed a lot of increased right hand and wrist pain.  Localizes pain to the radial aspect of the right wrist primarily near the first compartment.  Denies any known injury aside from this.  No falls.  He is left-hand dominant.  States it has hurt more than his prior left shoulder rotator cuff repair that he had last year.  Describes 11/x11/10 pain that has not improved with Voltaren gel or with splinting.  No history of diabetes.  Does have history of bilateral carpal tunnel release and trigger finger release on the left.  Pain wakes him up at night.  He has decreased strength and ability to grip.  Pain radiates up his forearm to about the mid forearm..                ROS: All systems reviewed are negative as they relate to the chief complaint within the history of present illness.  Patient denies fevers or chills.  Assessment & Plan: Visit Diagnoses:  1. Pain in right hand     Plan: Patient is a 76 year old male who presents for evaluation of right wrist/hand pain.  Has had increased pain over the last 2 months since using the screwdriver.  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  Exam is more suggestive of de Quervain's tenosynovitis.  After discussion of options, patient would like to try cortisone injection.  Using ultrasound guidance,  cortisone injection was successfully administered into the first dorsal compartment with care taken to avoid injecting the tendons.  Patient tolerated the procedure well.  Follow-up with the office in 6 weeks for clinical recheck.  Follow-Up Instructions: No follow-ups on file.   Orders:  Orders Placed This Encounter  Procedures   XR Hand Complete Right   US Guided Needle Placement - No Linked Charges   No orders of the defined types were placed in this encounter.     Procedures: No procedures performed   Clinical Data: No additional findings.  Objective: Vital Signs: There were no vitals taken for this visit.  Physical Exam:  Constitutional: Patient appears well-developed HEENT:  Head: Normocephalic Eyes:EOM are normal Neck: Normal range of motion Cardiovascular: Normal rate Pulmonary/chest: Effort normal Neurologic: Patient is alert Skin: Skin is warm Psychiatric: Patient has normal mood and affect  Ortho Exam: Ortho exam demonstrates right wrist with 2+ radial pulse.  No cellulitis or skin changes noted.  Intact EPL, FPL, finger abduction, finger adduction.  Pain with resisted EPL.  Positive Finkelstein's test.  No tenderness over the 3-4 portal, 4-5 portal.  Mild tenderness over the anatomic snuffbox.  Minimal pain with thumb circumduction.  Moderate tenderness over the first dorsal compartment just proximal to the radial  styloid  Specialty Comments:  No specialty comments available.  Imaging: No results found.   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   Chronic kidney disease (CKD), stage III (moderate) ([place]) 04/15/2019   VENTRAL HERNIA 11/23/2008   COLONIC POLYPS, HX OF 11/23/2008   HYPERLIPIDEMIA 11/11/2007   CARPAL TUNNEL SYNDROME 11/11/2007   GERD 11/11/2007   Sleep apnea 11/11/2007   PROSTATE CANCER, HX OF 11/11/2007   ANXIETY STATE NOS 05/18/2007   MIGRAINE  NEC W/INTRACTABLE MIGRAINE 03/23/2007   ANEMIA-NOS 09/23/2006   Past Medical History:  Diagnosis Date   Allergy    Colon polyps 2016   Depression    GERD (gastroesophageal reflux disease)    Heart murmur    Hyperlipidemia    IBS (irritable bowel syndrome)    Kidney stones    stage 3    Prostate cancer ([place]) 2003   sx 2003 and radation   Sleep apnea    no cpap per pt   Sleep apnea with use of continuous positive airway pressure (CPAP)     Family History  Problem Relation Age of Onset   Stroke Father 51   Bladder Cancer Father    Breast cancer Mother    Diabetes Mother    Neuropathy Mother    Valvular heart disease Mother    Heart attack Mother 78   Prostate cancer Brother    Heart disease Brother    Heart attack Brother    Prostate cancer Brother    Colon cancer Neg Hx    Colon polyps Neg Hx    Esophageal cancer Neg Hx    Stomach cancer Neg Hx    Rectal cancer Neg Hx     Past Surgical History:  Procedure Laterality Date   CARPAL TUNNEL RELEASE Bilateral    CERVICAL FUSION     COLONOSCOPY  2007, 08/09/2014   2007 [facility]/ 2016 [place]   COLONOSCOPY W/ POLYPECTOMY  2002   POLYPECTOMY  2016   polyps x3 T.A.   PROSTATECTOMY  2003   Dr.Davis   TONSILLECTOMY     Social History   Occupational History   Occupation: Retired  Tobacco Use   Smoking status: Former    Types: Cigarettes    Quit date: 08/04/1993    Years since quitting: 28.5   Smokeless tobacco: Never  Vaping Use   Vaping Use: Never used  Substance and Sexual Activity   Alcohol use: No   Drug use: No   Sexual activity: Not on file

## 2022-02-27 ENCOUNTER — Other Ambulatory Visit: Payer: Self-pay | Admitting: Family Medicine

## 2022-02-27 DIAGNOSIS — J309 Allergic rhinitis, unspecified: Secondary | ICD-10-CM

## 2022-03-19 ENCOUNTER — Ambulatory Visit: Payer: Medicare Other | Admitting: Orthopedic Surgery

## 2022-04-01 ENCOUNTER — Other Ambulatory Visit: Payer: Self-pay | Admitting: Family Medicine

## 2022-04-01 DIAGNOSIS — J309 Allergic rhinitis, unspecified: Secondary | ICD-10-CM

## 2022-04-08 DIAGNOSIS — Z23 Encounter for immunization: Secondary | ICD-10-CM | POA: Diagnosis not present

## 2022-04-09 DIAGNOSIS — J342 Deviated nasal septum: Secondary | ICD-10-CM | POA: Diagnosis not present

## 2022-04-09 DIAGNOSIS — J310 Chronic rhinitis: Secondary | ICD-10-CM | POA: Diagnosis not present

## 2022-04-09 DIAGNOSIS — H838X3 Other specified diseases of inner ear, bilateral: Secondary | ICD-10-CM | POA: Diagnosis not present

## 2022-04-09 DIAGNOSIS — H903 Sensorineural hearing loss, bilateral: Secondary | ICD-10-CM | POA: Diagnosis not present

## 2022-04-09 DIAGNOSIS — J343 Hypertrophy of nasal turbinates: Secondary | ICD-10-CM | POA: Diagnosis not present

## 2022-04-09 DIAGNOSIS — H6521 Chronic serous otitis media, right ear: Secondary | ICD-10-CM | POA: Diagnosis not present

## 2022-04-09 DIAGNOSIS — H6981 Other specified disorders of Eustachian tube, right ear: Secondary | ICD-10-CM | POA: Diagnosis not present

## 2022-04-23 DIAGNOSIS — C61 Malignant neoplasm of prostate: Secondary | ICD-10-CM | POA: Diagnosis not present

## 2022-04-30 DIAGNOSIS — C61 Malignant neoplasm of prostate: Secondary | ICD-10-CM | POA: Diagnosis not present

## 2022-04-30 DIAGNOSIS — R351 Nocturia: Secondary | ICD-10-CM | POA: Diagnosis not present

## 2022-04-30 DIAGNOSIS — N200 Calculus of kidney: Secondary | ICD-10-CM | POA: Diagnosis not present

## 2022-04-30 DIAGNOSIS — N3941 Urge incontinence: Secondary | ICD-10-CM | POA: Diagnosis not present

## 2022-05-05 ENCOUNTER — Ambulatory Visit (INDEPENDENT_AMBULATORY_CARE_PROVIDER_SITE_OTHER): Payer: Medicare Other

## 2022-05-05 VITALS — Ht 68.0 in | Wt 164.0 lb

## 2022-05-05 DIAGNOSIS — Z0000 Encounter for general adult medical examination without abnormal findings: Secondary | ICD-10-CM | POA: Diagnosis not present

## 2022-05-05 NOTE — Patient Instructions (Signed)
Luis Villegas , Thank you for taking time to come for your Medicare Wellness Visit. I appreciate your ongoing commitment to your health goals. Please review the following plan we discussed and let me know if I can assist you in the future.   Screening recommendations/referrals: Colonoscopy: completed 12/21/2019, due 12/21/2022 Recommended yearly ophthalmology/optometry visit for glaucoma screening and checkup Recommended yearly dental visit for hygiene and checkup  Vaccinations: Influenza vaccine: completed 04/08/2022 Pneumococcal vaccine: due Tdap vaccine: completed 05/04/2018, due 05/04/2028 Shingles vaccine: discussed   Covid-19:  05/11/2020, 10/11/2019, 09/18/2019  Advanced directives: Advance directive discussed with you today.   Conditions/risks identified: none  Next appointment: Follow up in one year for your annual wellness visit.   Preventive Care 75 Years and Older, Male Preventive care refers to lifestyle choices and visits with your health care provider that can promote health and wellness. What does preventive care include? A yearly physical exam. This is also called an annual well check. Dental exams once or twice a year. Routine eye exams. Ask your health care provider how often you should have your eyes checked. Personal lifestyle choices, including: Daily care of your teeth and gums. Regular physical activity. Eating a healthy diet. Avoiding tobacco and drug use. Limiting alcohol use. Practicing safe sex. Taking low doses of aspirin every day. Taking vitamin and mineral supplements as recommended by your health care provider. What happens during an annual well check? The services and screenings done by your health care provider during your annual well check will depend on your age, overall health, lifestyle risk factors, and family history of disease. Counseling  Your health care provider may ask you questions about your: Alcohol use. Tobacco use. Drug use. Emotional  well-being. Home and relationship well-being. Sexual activity. Eating habits. History of falls. Memory and ability to understand (cognition). Work and work Statistician. Screening  You may have the following tests or measurements: Height, weight, and BMI. Blood pressure. Lipid and cholesterol levels. These may be checked every 5 years, or more frequently if you are over 33 years old. Skin check. Lung cancer screening. You may have this screening every year starting at age 80 if you have a 30-pack-year history of smoking and currently smoke or have quit within the past 15 years. Fecal occult blood test (FOBT) of the stool. You may have this test every year starting at age 68. Flexible sigmoidoscopy or colonoscopy. You may have a sigmoidoscopy every 5 years or a colonoscopy every 10 years starting at age 54. Prostate cancer screening. Recommendations will vary depending on your family history and other risks. Hepatitis C blood test. Hepatitis B blood test. Sexually transmitted disease (STD) testing. Diabetes screening. This is done by checking your blood sugar (glucose) after you have not eaten for a while (fasting). You may have this done every 1-3 years. Abdominal aortic aneurysm (AAA) screening. You may need this if you are a current or former smoker. Osteoporosis. You may be screened starting at age 15 if you are at high risk. Talk with your health care provider about your test results, treatment options, and if necessary, the need for more tests. Vaccines  Your health care provider may recommend certain vaccines, such as: Influenza vaccine. This is recommended every year. Tetanus, diphtheria, and acellular pertussis (Tdap, Td) vaccine. You may need a Td booster every 10 years. Zoster vaccine. You may need this after age 75. Pneumococcal 13-valent conjugate (PCV13) vaccine. One dose is recommended after age 4. Pneumococcal polysaccharide (PPSV23) vaccine. One dose is  recommended after  age 9. Talk to your health care provider about which screenings and vaccines you need and how often you need them. This information is not intended to replace advice given to you by your health care provider. Make sure you discuss any questions you have with your health care provider. Document Released: 08/17/2015 Document Revised: 04/09/2016 Document Reviewed: 05/22/2015 Elsevier Interactive Patient Education  2017 [place] Prevention in the Home Falls can cause injuries. They can happen to people of all ages. There are many things you can do to make your home safe and to help prevent falls. What can I do on the outside of my home? Regularly fix the edges of walkways and driveways and fix any cracks. Remove anything that might make you trip as you walk through a door, such as a raised step or threshold. Trim any bushes or trees on the path to your home. Use bright outdoor lighting. Clear any walking paths of anything that might make someone trip, such as rocks or tools. Regularly check to see if handrails are loose or broken. Make sure that both sides of any steps have handrails. Any raised decks and porches should have guardrails on the edges. Have any leaves, snow, or ice cleared regularly. Use sand or salt on walking paths during winter. Clean up any spills in your garage right away. This includes oil or grease spills. What can I do in the bathroom? Use night lights. Install grab bars by the toilet and in the tub and shower. Do not use towel bars as grab bars. Use non-skid mats or decals in the tub or shower. If you need to sit down in the shower, use a plastic, non-slip stool. Keep the floor dry. Clean up any water that spills on the floor as soon as it happens. Remove soap buildup in the tub or shower regularly. Attach bath mats securely with double-sided non-slip rug tape. Do not have throw rugs and other things on the floor that can make you trip. What can I do in the  bedroom? Use night lights. Make sure that you have a light by your bed that is easy to reach. Do not use any sheets or blankets that are too big for your bed. They should not hang down onto the floor. Have a firm chair that has side arms. You can use this for support while you get dressed. Do not have throw rugs and other things on the floor that can make you trip. What can I do in the kitchen? Clean up any spills right away. Avoid walking on wet floors. Keep items that you use a lot in easy-to-reach places. If you need to reach something above you, use a strong step stool that has a grab bar. Keep electrical cords out of the way. Do not use floor polish or wax that makes floors slippery. If you must use wax, use non-skid floor wax. Do not have throw rugs and other things on the floor that can make you trip. What can I do with my stairs? Do not leave any items on the stairs. Make sure that there are handrails on both sides of the stairs and use them. Fix handrails that are broken or loose. Make sure that handrails are as long as the stairways. Check any carpeting to make sure that it is firmly attached to the stairs. Fix any carpet that is loose or worn. Avoid having throw rugs at the top or bottom of the stairs. If  you do have throw rugs, attach them to the floor with carpet tape. Make sure that you have a light switch at the top of the stairs and the bottom of the stairs. If you do not have them, ask someone to add them for you. What else can I do to help prevent falls? Wear shoes that: Do not have high heels. Have rubber bottoms. Are comfortable and fit you well. Are closed at the toe. Do not wear sandals. If you use a stepladder: Make sure that it is fully opened. Do not climb a closed stepladder. Make sure that both sides of the stepladder are locked into place. Ask someone to hold it for you, if possible. Clearly mark and make sure that you can see: Any grab bars or  handrails. First and last steps. Where the edge of each step is. Use tools that help you move around (mobility aids) if they are needed. These include: Canes. Walkers. Scooters. Crutches. Turn on the lights when you go into a dark area. Replace any light bulbs as soon as they burn out. Set up your furniture so you have a clear path. Avoid moving your furniture around. If any of your floors are uneven, fix them. If there are any pets around you, be aware of where they are. Review your medicines with your doctor. Some medicines can make you feel dizzy. This can increase your chance of falling. Ask your doctor what other things that you can do to help prevent falls. This information is not intended to replace advice given to you by your health care provider. Make sure you discuss any questions you have with your health care provider. Document Released: 05/17/2009 Document Revised: 12/27/2015 Document Reviewed: 08/25/2014 Elsevier Interactive Patient Education  2017 [employer].

## 2022-05-05 NOTE — Progress Notes (Signed)
I connected with Luis Villegas today by telephone and verified that I am speaking with the correct person using two identifiers. Location patient: home Location provider: work Persons participating in the virtual visit: Chloe Baig, Glenna Durand LPN.   I discussed the limitations, risks, security and privacy concerns of performing an evaluation and management service by telephone and the availability of in person appointments. I also discussed with the patient that there may be a patient responsible charge related to this service. The patient expressed understanding and verbally consented to this telephonic visit.    Interactive audio and video telecommunications were attempted between this provider and patient, however failed, due to patient having technical difficulties OR patient did not have access to video capability.  We continued and completed visit with audio only.     Vital signs may be patient reported or missing.  Subjective:   Luis Villegas is a 76 y.o. male who presents for Medicare Annual/Subsequent preventive examination.  Review of Systems     Cardiac Risk Factors include: advanced age (>43mn, >>28women);dyslipidemia;male gender     Objective:    Today's Vitals   05/05/22 0941  Weight: 164 lb (74.4 kg)  Height: '5\' 8"'$  (1.727 m)  PainSc: 6    Body mass index is 24.94 kg/m.     05/05/2022    9:46 AM 05/03/2021    8:23 AM 04/18/2020    9:47 AM  Advanced Directives  Does Patient Have a Medical Advance Directive? No No No  Would patient like information on creating a medical advance directive?  No - Patient declined No - Patient declined    Current Medications (verified) Outpatient Encounter Medications as of 05/05/2022  Medication Sig   acetaminophen (TYLENOL) 500 MG tablet Take 500 mg by mouth every 6 (six) hours as needed.   acyclovir (ZOVIRAX) 200 MG capsule TAKE 1 CAPSULE(200 MG) BY MOUTH DAILY   buPROPion (WELLBUTRIN XL) 150 MG 24 hr  tablet TAKE 1 TABLET(150 MG) BY MOUTH DAILY   citalopram (CELEXA) 20 MG tablet TAKE 1 TABLET(20 MG) BY MOUTH DAILY   ezetimibe-simvastatin (VYTORIN) 10-40 MG tablet TAKE 1 TABLET BY MOUTH AT BEDTIME   montelukast (SINGULAIR) 10 MG tablet TAKE 1 TABLET(10 MG) BY MOUTH AT BEDTIME FOR ALLERGY SYMPTOMS   Multiple Vitamin (MULTIVITAMIN ADULT PO) Take by mouth.   omeprazole (PRILOSEC) 20 MG capsule TAKE 1 CAPSULE BY MOUTH EVERY DAY   SUMAtriptan (IMITREX) 100 MG tablet TAKE 1 TABLET BY MOUTH AS NEEDED FOR HEADACHE   tamsulosin (FLOMAX) 0.4 MG CAPS capsule Take 0.4 mg by mouth daily.   topiramate (TOPAMAX) 200 MG tablet TAKE 1 TABLET(200 MG) BY MOUTH AT BEDTIME   solifenacin (VESICARE) 10 MG tablet Take 10 mg by mouth daily.   No facility-administered encounter medications on file as of 05/05/2022.    Allergies (verified) Lipitor [atorvastatin calcium]   History: Past Medical History:  Diagnosis Date   Allergy    Colon polyps 2016   Depression    GERD (gastroesophageal reflux disease)    Heart murmur    Hyperlipidemia    IBS (irritable bowel syndrome)    Kidney stones    stage 3    [place]   sx 2003 and rad[place]   Sleep apnea    no cpap per pt   Sleep apnea with use of continuous positive airway pressure (CPAP)    Past Surgical History:  Procedure Laterality Date   CARPAL TUNNEL RELEASE Bilateral    CERVICAL  FUSION     COLONOSCOPY  2007, 08/09/2014   2007 [facility]/ 2016 [place]   COLONOSCOPY W/ POLYPECTOMY  2002   POLYPECTOMY  2016   polyps x3 T.A.   PROSTATECTOMY  2003   Dr.Davis   TONSILLECTOMY     Family History  Problem Relation Age of Onset   Stroke Father 28   Bladder Cancer Father    Breast cancer Mother    Diabetes Mother    Neuropathy Mother    Valvular heart disease Mother    Heart attack Mother 26   Prostate cancer Brother    Heart disease Brother    Heart attack Brother    Prostate cancer Brother    Colon cancer Neg Hx    Colon  polyps Neg Hx    Esophageal cancer Neg Hx    Stomach cancer Neg Hx    Rectal cancer Neg Hx    Social History   Socioeconomic History   Marital status: Married    Spouse name: Not on file   Number of children: 1   Years of education: Not on file   Highest education level: Some college, no degree  Occupational History   Occupation: Retired  Tobacco Use   Smoking status: Former    Types: Cigarettes    Quit date: 08/04/1993    Years since quitting: 28.7   Smokeless tobacco: Never  Vaping Use   Vaping Use: Never used  Substance and Sexual Activity   Alcohol use: No   Drug use: No   Sexual activity: Not on file  Other Topics Concern   Not on file  Social History Narrative   Not on file   Social Determinants of Health   Financial Resource Strain: Low Risk  (05/05/2022)   Overall Financial Resource Strain (CARDIA)    Difficulty of Paying Living Expenses: Not hard at all  Food Insecurity: No Food Insecurity (05/05/2022)   Hunger Vital Sign    Worried About Running Out of Food in the Last Year: Never true    Ran Out of Food in the Last Year: Never true  Transportation Needs: No Transportation Needs (05/05/2022)   PRAPARE - Hydrologist (Medical): No    Lack of Transportation (Non-Medical): No  Physical Activity: Inactive (05/05/2022)   Exercise Vital Sign    Days of Exercise per Week: 0 days    Minutes of Exercise per Session: 0 min  Stress: No Stress Concern Present (05/05/2022)   [place]    Feeling of Stress : Only a little  Social Connections: Moderately Integrated (08/15/2021)   Social Connection and Isolation Panel [NHANES]    Frequency of Communication with Friends and Family: Once a week    Frequency of Social Gatherings with Friends and Family: Once a week    Attends Religious Services: More than 4 times per year    Active Member of [employer] or Organizations: Yes    Attends  Music therapist: More than 4 times per year    Marital Status: Married    Tobacco Counseling Counseling given: Not Answered   Clinical Intake:  Pre-visit preparation completed: Yes  Pain : 0-10 Pain Score: 6  Pain Type: Chronic pain Pain Location: Neck (left hand) Pain Orientation: Right Pain Descriptors / Indicators: Aching Pain Onset: More than a month ago Pain Frequency: Intermittent     Nutritional Status: BMI of 19-24  Normal Nutritional Risks: Nausea/ vomitting/ diarrhea (diarrhea  from radiation) Diabetes: No  How often do you need to have someone help you when you read instructions, pamphlets, or other written materials from your doctor or pharmacy?: 1 - Never What is the last grade level you completed in school?: 6yrcollege  Diabetic? no  Interpreter Needed?: No  Information entered by :: NAllen LPN   Activities of Daily Living    05/05/2022    9:48 AM 05/01/2022    9:46 AM  In your present state of health, do you have any difficulty performing the following activities:  Hearing? 0 0  Vision? 0 0  Difficulty concentrating or making decisions? 0 0  Walking or climbing stairs? 0 0  Dressing or bathing? 0 0  Doing errands, shopping? 0 0  Preparing Food and eating ? N N  Using the Toilet? N Y  In the past six months, have you accidently leaked urine? Y Y  Do you have problems with loss of bowel control? Y Y  Managing your Medications? N N  Managing your Finances? N N  Housekeeping or managing your Housekeeping? N N    Patient Care Team: BBillie Ruddy MD as PCP - General (Family Medicine) TLavonna Monarch MD (Inactive) as Consulting Physician (Dermatology)  Indicate any recent Medical Services you may have received from other than [facility] providers in the past year (date may be approximate).     Assessment:   This is a routine wellness examination for [place]  Hearing/Vision screen Vision Screening - Comments:: Regular eye exams,  [place] Dietary issues and exercise activities discussed: Current Exercise Habits: The patient does not participate in regular exercise at present   Goals Addressed             This Visit's Progress    Patient Stated       05/05/2022, keep on living       Depression Screen    05/05/2022    9:47 AM 12/26/2021   10:38 AM 08/19/2021    1:32 PM 05/03/2021    8:24 AM 05/03/2021    8:22 AM 04/18/2020    9:49 AM 12/23/2017    9:08 AM  PHQ 2/9 Scores  PHQ - 2 Score 0 1 1 0 0 0 0  PHQ- 9 Score  3 4   0 2    Fall Risk    05/05/2022    9:46 AM 05/01/2022    9:46 AM 12/26/2021   10:36 AM 08/19/2021    1:31 PM 08/15/2021   10:20 AM  Fall Risk   Falls in the past year? '1 1 1 1 1  '$ Comment loses balance      Number falls in past yr: '1 1 1 '$ 0 0  Injury with Fall? 0 0 0 1 1  Risk for fall due to : History of fall(s);Medication side effect  Other (Comment)    Follow up Falls evaluation completed;Education provided;Falls prevention discussed  Falls evaluation completed      FALL RISK PREVENTION PERTAINING TO THE HOME:  Any stairs in or around the home? Yes  If so, are there any without handrails? No  Home free of loose throw rugs in walkways, pet beds, electrical cords, etc? Yes  Adequate lighting in your home to reduce risk of falls? Yes   ASSISTIVE DEVICES UTILIZED TO PREVENT FALLS:  Life alert? No  Use of a cane, walker or w/c? No  Grab bars in the bathroom? No  Shower chair or bench in shower? Yes  Elevated toilet seat or a handicapped toilet? No   TIMED UP AND GO:  Was the test performed? No .  .     Cognitive Function:        05/05/2022    9:49 AM 04/18/2020    9:53 AM  6CIT Screen  What Year? 0 points 0 points  What month? 0 points 0 points  What time? 0 points 0 points  Count back from 20 0 points 0 points  Months in reverse 0 points 0 points  Repeat phrase 4 points 4 points  Total Score 4 points 4 points    Immunizations Immunization History   Administered Date(s) Administered   Fluad Quad(high Dose 65+) 04/18/2020, 04/11/2021   Influenza, High Dose Seasonal PF 04/07/2018, 03/25/2019, 03/25/2019   Influenza-Unspecified 03/25/2019, 04/08/2022   PFIZER(Purple Top)SARS-COV-2 Vaccination 09/18/2019, 10/11/2019, 05/11/2020   Td 04/12/2008   Tdap 05/04/2018   Zoster, Live 01/25/2010    TDAP status: Up to date  Flu Vaccine status: Up to date  Pneumococcal vaccine status: Due, Education has been provided regarding the importance of this vaccine. Advised may receive this vaccine at local pharmacy or Health Dept. Aware to provide a copy of the vaccination record if obtained from local pharmacy or Health Dept. Verbalized acceptance and understanding.  Covid-19 vaccine status: Completed vaccines  Qualifies for Shingles Vaccine? Yes   Zostavax completed Yes   Shingrix Completed?: No.    Education has been provided regarding the importance of this vaccine. Patient has been advised to call insurance company to determine out of pocket expense if they have not yet received this vaccine. Advised may also receive vaccine at local pharmacy or Health Dept. Verbalized acceptance and understanding.  Screening Tests Health Maintenance  Topic Date Due   Hepatitis C Screening  Never done   Zoster Vaccines- Shingrix (1 of 2) Never done   Pneumonia Vaccine 76+ Years old (1 - PCV) Never done   COVID-19 Vaccine (4 - Pfizer risk series) 07/06/2020   COLONOSCOPY (Pts 45-4yr Insurance coverage will need to be confirmed)  12/21/2022   TETANUS/TDAP  05/04/2028   INFLUENZA VACCINE  Completed   HPV VACCINES  Aged Out    Health Maintenance  Health Maintenance Due  Topic Date Due   Hepatitis C Screening  Never done   Zoster Vaccines- Shingrix (1 of 2) Never done   Pneumonia Vaccine 75 Years old (1 - PCV) Never done   COVID-19 Vaccine (4 - Pfizer risk series) 07/06/2020    Colorectal cancer screening: Type of screening: Colonoscopy. Completed  12/21/2019. Repeat every 3 years  Lung Cancer Screening: (Low Dose CT Chest recommended if Age 75-80years, 30 pack-year currently smoking OR have quit w/in 15years.) does not qualify.   Lung Cancer Screening Referral: no  Additional Screening:  Hepatitis C Screening: does qualify;  Vision Screening: Recommended annual ophthalmology exams for early detection of glaucoma and other disorders of the eye. Is the patient up to date with their annual eye exam?  Yes  Who is the provider or what is the name of the office in which the patient attends annual eye exams? [place] is not established with a provider, would they like to be referred to a provider to establish care? No .   Dental Screening: Recommended annual dental exams for proper oral hygiene  Community Resource Referral / Chronic Care Management: CRR required this visit?  No   CCM required this visit?  No      Plan:  I have personally reviewed and noted the following in the patient's chart:   Medical and social history Use of alcohol, tobacco or illicit drugs  Current medications and supplements including opioid prescriptions. Patient is not currently taking opioid prescriptions. Functional ability and status Nutritional status Physical activity Advanced directives List of other physicians Hospitalizations, surgeries, and ER visits in previous 12 months Vitals Screenings to include cognitive, depression, and falls Referrals and appointments  In addition, I have reviewed and discussed with patient certain preventive protocols, quality metrics, and best practice recommendations. A written personalized care plan for preventive services as well as general preventive health recommendations were provided to patient.     Kellie Simmering, LPN   [id number]   Nurse Notes: none  Due to this being a virtual visit, the after visit summary with patients personalized plan was offered to patient via mail or my-chart.  Patient would like to access on my-chart

## 2022-05-26 ENCOUNTER — Telehealth: Payer: Self-pay | Admitting: Gastroenterology

## 2022-05-26 NOTE — Telephone Encounter (Signed)
Returned call to patient. He was not in so I spoke with his wife. I explained to patient's wife that he will need an appt since it has been years since he has been seen. Offered patient an appt tomorrow morning with Dr. Havery Moros but patient already has an appt. Dr. Doyne Keel next available appt is not until December. Pt has been scheduled for a follow up appointment with Alonza Bogus, PA-C on Wednesday, 06/11/22 at 1:30 pm. Pt's wife knows to contact the office if patient would like to reschedule for any reason. Pt's wife verbalized understanding and had no concerns at the end of the call.

## 2022-05-26 NOTE — Telephone Encounter (Signed)
Inbound call from patient stating that he is having " Gastro issues" since he has taken his chemo and is requesting a call back to discuss. Please advise.

## 2022-06-04 ENCOUNTER — Ambulatory Visit (INDEPENDENT_AMBULATORY_CARE_PROVIDER_SITE_OTHER): Payer: Medicare Other | Admitting: Surgical

## 2022-06-04 ENCOUNTER — Encounter: Payer: Self-pay | Admitting: Orthopedic Surgery

## 2022-06-04 DIAGNOSIS — M654 Radial styloid tenosynovitis [de Quervain]: Secondary | ICD-10-CM | POA: Diagnosis not present

## 2022-06-04 NOTE — Progress Notes (Signed)
Follow-up Office Visit Note   Patient: Luis Villegas           Date of Birth: 01-Dec-1945           MRN: [id number] Visit Date: 06/04/2022 Requested by: Billie Ruddy, MD [place],  [place] [id number] PCP: Billie Ruddy, MD  Subjective: Chief Complaint  Patient presents with   Right Hand - Follow-up    HPI: Luis Villegas is a 76 y.o. male who returns to the office for follow-up visit.    Plan at last visit was: Patient is a 76 year old male who presents for evaluation of right wrist/hand pain.  Has had increased pain over the last 2 months since using the screwdriver.  Radiographs show mild  first CMC arthritis.  Exam is more suggestive of de Quervain's tenosynovitis.  No tendon rupture suggested on physical exam.  After discussion of options, patient would like to try cortisone injection.  Using ultrasound guidance, cortisone injection was successfully administered into the first dorsal compartment with care taken to avoid injecting the tendons.  Patient tolerated the procedure well.  Follow-up with the office in 6 weeks for clinical recheck.  Since then, patient notes he had about 2 months of great relief from the previous injection at his last office visit but now pain has returned with worsening pain and worse symptoms compared with prior to injection.  He is left-hand dominant.  Pain is worse in the radial aspect of the wrist near the first dorsal compartment.  Worse at night.  Has difficulty doing simple things such as holding a coffee cup, using tools, opening a bottle and unscrewing things.              ROS: All systems reviewed are negative as they relate to the chief complaint within the history of present illness.  Patient denies fevers or chills.  Assessment & Plan: Visit Diagnoses:  1. De Quervain's tenosynovitis, right     Plan: Luis Villegas is a 76 y.o. male who returns to the office for follow-up visit for right  wrist de Quervain's tenosynovitis.  Plan from last visit was noted above in HPI.  They now return with 38-monthrelief from injection but now has return of symptoms.  After discussion of further options such as repeat injection or surgical intervention, he would like to proceed with surgery.  Discussed the risks and benefits of the procedure including but not limited to the risk of nerve/blood vessel damage, infection, stiffness, tendon rupture, incomplete resolution of symptoms, medical complication from surgery.  Discussed the expected timeframe for symptomatic improvement after surgery.  After discussion he would still like to proceed with surgery.  He will be posted for likely November 13 and follow-up after procedure.  Follow-Up Instructions: No follow-ups on file.   Orders:  No orders of the defined types were placed in this encounter.  No orders of the defined types were placed in this encounter.     Procedures: No procedures performed   Clinical Data: No additional findings.  Objective: Vital Signs: There were no vitals taken for this visit.  Physical Exam:  Constitutional: Patient appears well-developed HEENT:  Head: Normocephalic Eyes:EOM are normal Neck: Normal range of motion Cardiovascular: Normal rate Pulmonary/chest: Effort normal Neurologic: Patient is alert Skin: Skin is warm Psychiatric: Patient has normal mood and affect  Ortho Exam: Ortho exam demonstrates tenderness over the first dorsal compartment.  Positive Finkelstein sign.  2+ radial pulse of the  right upper extremity.  Intact EPL FPL, wrist extension, finger abduction.  Resisted EPL with reproduction of pain.  No tenderness over the anatomic snuffbox, 3-4 portal, 4-5 portal.  No cellulitis or skin changes noted.  Specialty Comments:  No specialty comments available.  Imaging: No results found.   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   Chronic kidney disease (CKD), stage III (moderate) ([place]) 04/15/2019   VENTRAL HERNIA 11/23/2008   COLONIC POLYPS, HX OF 11/23/2008   HYPERLIPIDEMIA 11/11/2007   CARPAL TUNNEL SYNDROME 11/11/2007   GERD 11/11/2007   Sleep apnea 11/11/2007   PROSTATE CANCER, HX OF 11/11/2007   ANXIETY STATE NOS 05/18/2007   MIGRAINE NEC W/INTRACTABLE MIGRAINE 03/23/2007   ANEMIA-NOS 09/23/2006   Past Medical History:  Diagnosis Date   Allergy    Colon polyps 2016   Depression    GERD (gastroesophageal reflux disease)    Heart murmur    Hyperlipidemia    IBS (irritable bowel syndrome)    Kidney stones    stage 3    Prostate cancer ([place]) 2003   sx 2003 and radation   Sleep apnea    no cpap per pt   Sleep apnea with use of continuous positive airway pressure (CPAP)     Family History  Problem Relation Age of Onset   Stroke Father 60   Bladder Cancer Father    Breast cancer Mother    Diabetes Mother    Neuropathy Mother    Valvular heart disease Mother    Heart attack Mother 70   Prostate cancer Brother    Heart disease Brother    Heart attack Brother    Prostate cancer Brother    Colon cancer Neg Hx    Colon polyps Neg Hx    Esophageal cancer Neg Hx    Stomach cancer Neg Hx    Rectal cancer Neg Hx     Past Surgical History:  Procedure Laterality Date   CARPAL TUNNEL RELEASE Bilateral    CERVICAL FUSION     COLONOSCOPY  2007, 08/09/2014   2007 [facility]/ 2016 [place]   COLONOSCOPY W/ POLYPECTOMY  2002   POLYPECTOMY  2016   polyps x3 T.A.   PROSTATECTOMY  2003   Dr.Davis   TONSILLECTOMY     Social History   Occupational History   Occupation: Retired  Tobacco Use   Smoking status: Former    Types: Cigarettes    Quit date: 08/04/1993    Years since quitting: 28.8   Smokeless tobacco: Never  Vaping Use   Vaping Use: Never used  Substance and Sexual Activity   Alcohol use: No   Drug use: No   Sexual activity: Not on file

## 2022-06-10 DIAGNOSIS — R3915 Urgency of urination: Secondary | ICD-10-CM | POA: Diagnosis not present

## 2022-06-10 DIAGNOSIS — N3941 Urge incontinence: Secondary | ICD-10-CM | POA: Diagnosis not present

## 2022-06-11 ENCOUNTER — Encounter: Payer: Self-pay | Admitting: Gastroenterology

## 2022-06-11 ENCOUNTER — Other Ambulatory Visit: Payer: Medicare Other

## 2022-06-11 ENCOUNTER — Ambulatory Visit (INDEPENDENT_AMBULATORY_CARE_PROVIDER_SITE_OTHER): Payer: Medicare Other | Admitting: Gastroenterology

## 2022-06-11 VITALS — BP 118/62 | HR 78 | Ht 68.0 in | Wt 169.0 lb

## 2022-06-11 DIAGNOSIS — K529 Noninfective gastroenteritis and colitis, unspecified: Secondary | ICD-10-CM | POA: Diagnosis not present

## 2022-06-11 MED ORDER — LOPERAMIDE HCL 2 MG PO TABS: ORAL_TABLET | ORAL | 0 refills | Status: AC

## 2022-06-11 NOTE — Patient Instructions (Addendum)
If you are age 75 or older, your body mass index should be between 23-30. Your Body mass index is 25.7 kg/m. If this is out of the aforementioned range listed, please consider follow up with your Primary Care Provider.  If you are age 9 or younger, your body mass index should be between 19-25. Your Body mass index is 25.7 kg/m. If this is out of the aformentioned range listed, please consider follow up with your Primary Care Provider.   ________________________________________________________  Please go to the lab in the basement of our building to have lab work done as you leave today. Hit "B" for basement when you get on the elevator.  When the doors open the lab is on your left.  We will call you with the results. Thank you.   Please use imodium (1/2 tablet) every morning and titrate as needed.  Thank you for entrusting me with your care and for choosing [employer], Alonza Bogus, P.A. - C.

## 2022-06-11 NOTE — Progress Notes (Signed)
06/11/2022 Luis Villegas [id number] 25-Jun-1946   HISTORY OF PRESENT ILLNESS:  This is a 76 year old male who is a patient of Dr. Doyne Keel.  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  Has chronic diarrhea.  This has been present for several years.  Has had a few colonoscopies with the most recent listed below.  Has several bowel movements a day.  Denies seeing blood in his stool.  Does have urgency.  Says he feels like he cannot leave the house at times.  Uses Imodium as needed, which does help.  It looks like he was given samples of Viberzi back in 2019 and it seemed like it helped, but then he was not able to afford it with prescription.  Looks like then we started him on colestipol, but he cannot recall whether he took that or if it helped, but is no longer on it at this time.  Colonoscopy 12/2019:  - The examined portion of the ileum was normal. - Two 3 mm polyps in the cecum, removed with a cold snare. Resected and retrieved. - One diminutive polyp at the ileocecal valve, removed with a cold biopsy forceps. Resected and retrieved. - Two 4 to 8 mm polyps in the ascending colon, removed with a cold snare. Resected and retrieved. - One 8 mm polyp at the hepatic flexure, removed with a cold snare. Resected and retrieved. - Four 4 to 8 mm polyps in the transverse colon, removed with a cold snare. Resected and retrieved. - One 3 mm polyp in the sigmoid colon, removed with a cold snare. Resected and retrieved. - Tortuous colon. - Very mild distal radiation proctitis, small rectum. - The examination was otherwise normal. - Biopsies were taken with a cold forceps from the right colon and left colon for evaluation of microscopic colitis.  1. Surgical [P], colon, sigmoid, transverse, cecal, ascending, IC valve, hepatic flexure, polyp (11) - SESSILE SERRATED POLYP(S) WITHOUT CYTOLOGIC DYSPLASIA - HYPERPLASTIC POLYP(S) 2. Surgical  [P], right & left colon - COLONIC MUCOSA WITH NO SPECIFIC HISTOPATHOLOGIC CHANGES - NEGATIVE FOR ACUTE INFLAMMATION, INCREASED INTRAEPITHELIAL LYMPHOCYTES OR THICKENED SUBEPITHELIAL COLLAGEN TABLE.  Due for 3 year colonoscopy recall 12/2022.  Past Medical History:  Diagnosis Date   Allergy    Colon polyps 2016   Depression    GERD (gastroesophageal reflux disease)    Heart murmur    Hyperlipidemia    IBS (irritable bowel syndrome)    Kidney stones    stage 3    Prostate cancer ([place]) 2003   sx 2003 and radation   Sleep apnea    no cpap per pt   Sleep apnea with use of continuous positive airway pressure (CPAP)    Past Surgical History:  Procedure Laterality Date   CARPAL TUNNEL RELEASE Bilateral    CERVICAL FUSION     COLONOSCOPY  2007, 08/09/2014   2007 [facility]/ 2016 [place]   COLONOSCOPY W/ POLYPECTOMY  2002   POLYPECTOMY  2016   polyps x3 T.A.   PROSTATECTOMY  2003   Dr.Davis   TONSILLECTOMY      reports that he quit smoking about 28 years ago. His smoking use included cigarettes. He has never used smokeless tobacco. He reports that he does not drink alcohol and does not use drugs. family history includes Bladder Cancer in his father; Breast cancer in his mother; Diabetes in his mother; Heart attack in his brother; Heart attack (age of onset: 43) in  his mother; Heart disease in his brother; Neuropathy in his mother; Prostate cancer in his brother and brother; Stroke (age of onset: 19) in his father; Valvular heart disease in his mother. Allergies  Allergen Reactions   Lipitor [Atorvastatin Calcium]     myalgias      Outpatient Encounter Medications as of 06/11/2022  Medication Sig   acetaminophen (TYLENOL) 500 MG tablet Take 500 mg by mouth every 6 (six) hours as needed.   acyclovir (ZOVIRAX) 200 MG capsule TAKE 1 CAPSULE(200 MG) BY MOUTH DAILY   buPROPion (WELLBUTRIN XL) 150 MG 24 hr tablet TAKE 1 TABLET(150 MG) BY MOUTH DAILY   citalopram (CELEXA) 20 MG tablet  TAKE 1 TABLET(20 MG) BY MOUTH DAILY   ezetimibe-simvastatin (VYTORIN) 10-40 MG tablet TAKE 1 TABLET BY MOUTH AT BEDTIME   Multiple Vitamin (MULTIVITAMIN ADULT PO) Take by mouth.   omeprazole (PRILOSEC) 20 MG capsule TAKE 1 CAPSULE BY MOUTH EVERY DAY   SUMAtriptan (IMITREX) 100 MG tablet TAKE 1 TABLET BY MOUTH AS NEEDED FOR HEADACHE   tamsulosin (FLOMAX) 0.4 MG CAPS capsule Take 0.4 mg by mouth daily.   topiramate (TOPAMAX) 200 MG tablet TAKE 1 TABLET(200 MG) BY MOUTH AT BEDTIME   solifenacin (VESICARE) 10 MG tablet Take 10 mg by mouth daily.   [DISCONTINUED] montelukast (SINGULAIR) 10 MG tablet TAKE 1 TABLET(10 MG) BY MOUTH AT BEDTIME FOR ALLERGY SYMPTOMS   No facility-administered encounter medications on file as of 06/11/2022.     REVIEW OF SYSTEMS  : All other systems reviewed and negative except where noted in the History of Present Illness.   PHYSICAL EXAM: BP 118/62   Pulse 78   Ht '5\' 8"'$  (1.727 m)   Wt 169 lb (76.7 kg)   SpO2 98%   BMI 25.70 kg/m  General: Well developed white male in no acute distress Head: Normocephalic and atraumatic Eyes:  Sclerae anicteric, conjunctiva pink. Ears: Normal auditory acuity Lungs: Clear throughout to auscultation; no W/R/R. Heart: Regular rate and rhythm; no M/R/G. Abdomen: Soft, non-distended.  BS present and somewhat hyperactive.  Non-tender. Musculoskeletal: Symmetrical with no gross deformities  Skin: No lesions on visible extremities Extremities: No edema  Neurological: Alert oriented x 4, grossly non-focal Psychological:  Alert and cooperative. Normal mood and affect  ASSESSMENT AND PLAN: *Chronic diarrhea: Presumed to be IBS-D.  Handful of colonoscopies over the years have been unrevealing as to the cause.  Looks like he responded to samples of Viberzi, but was unable to afford it so could not continue that.  We will check celiac labs.  We will check a pancreatic fecal elastase to rule out EPI.  He uses Imodium as needed with good  results.  We discussed using that regularly on a daily basis in the mornings, starting with a half a tablet and increasing/titrating as needed.  Also discussed other options including Lomotil or trying for Viberzi again.  CC:  Billie Ruddy, MD

## 2022-06-12 ENCOUNTER — Other Ambulatory Visit: Payer: Medicare Other

## 2022-06-12 DIAGNOSIS — K529 Noninfective gastroenteritis and colitis, unspecified: Secondary | ICD-10-CM | POA: Diagnosis not present

## 2022-06-12 LAB — TISSUE TRANSGLUTAMINASE, IGA: (tTG) Ab, IgA: <1 U/mL

## 2022-06-12 LAB — IGA: Immunoglobulin A: 227 mg/dL (ref 70–320)

## 2022-06-12 NOTE — Progress Notes (Signed)
Agree with assessment and plan as outlined.  

## 2022-06-16 ENCOUNTER — Telehealth: Payer: Self-pay | Admitting: Orthopedic Surgery

## 2022-06-16 NOTE — Telephone Encounter (Signed)
Patient is scheduled for right de quervain's release at the surgical center of  06-18-22.  He would like to talk to Dr Marlou Sa prior to the surgery (Wednesday AM) .  He would like to discuss the anesthesia and make sure he is not being intubated for this procedure.  IV regional is indicated on the surgery sheet.  Patient's can be reached at [phone].

## 2022-06-17 LAB — PANCREATIC ELASTASE, FECAL: Pancreatic Elastase-1, Stool: 234 mcg/g

## 2022-06-18 ENCOUNTER — Telehealth: Payer: Self-pay | Admitting: Gastroenterology

## 2022-06-18 NOTE — Telephone Encounter (Signed)
Patient returned your call, please advise. 

## 2022-06-18 NOTE — Telephone Encounter (Signed)
See results note. 

## 2022-06-20 ENCOUNTER — Other Ambulatory Visit: Payer: Self-pay | Admitting: Surgical

## 2022-06-20 ENCOUNTER — Telehealth: Payer: Self-pay | Admitting: Orthopedic Surgery

## 2022-06-20 DIAGNOSIS — M654 Radial styloid tenosynovitis [de Quervain]: Secondary | ICD-10-CM | POA: Diagnosis not present

## 2022-06-20 IMAGING — MR MR SHOULDER*L* W/ CM
5 of 6 series · 37 of 40 positions shown · IV contrast (agent unspecified)
Comparison: X-ray 09/11/2020

CLINICAL DATA: Left shoulder pain for 1 month after fall

EXAM:
MR ARTHROGRAM OF THE LEFT SHOULDER
TECHNIQUE: Multiplanar, multisequence MR imaging of the left shoulder was
performed following the administration of intra-articular contrast.
CONTRAST:  See Injection Documentation.

[Series 5: T1 fat-sat · axial · 4.0mm · 0.50mm/px · z∈[-53,+43]mm · 7 of 22 slices shown (1 of 3)]
[im 1/22]
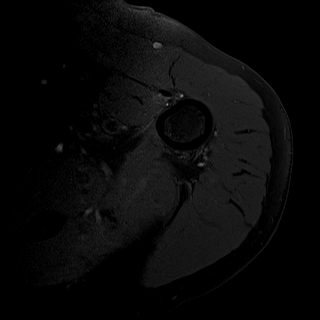
[im 4/22]
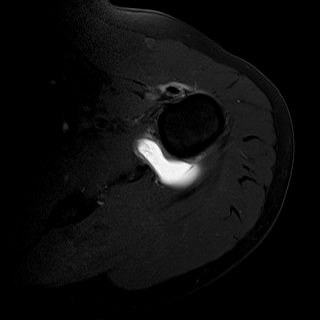
[im 8/22]
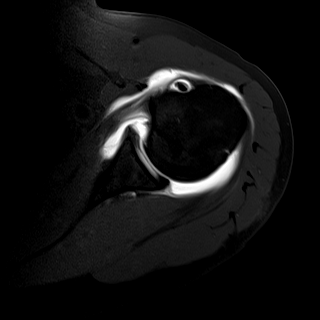
[im 11/22]
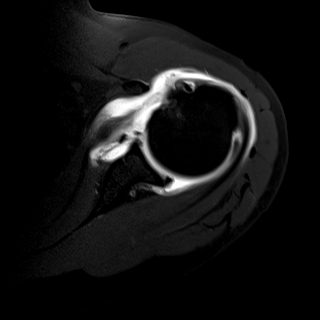
[im 15/22]
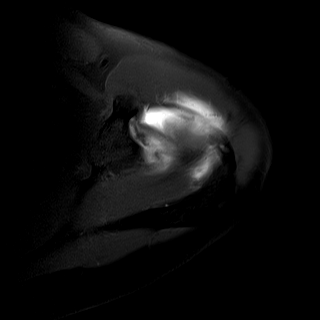
[im 18/22]
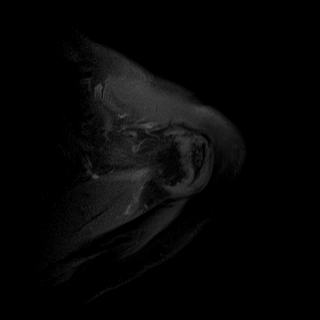
[im 22/22]
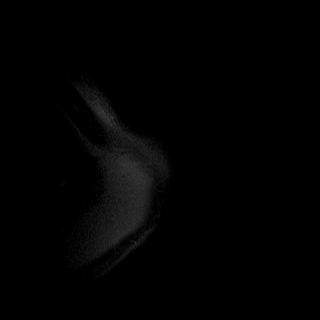

[Series 6: T1 fat-sat · coronal · 3.0mm · 0.50mm/px · 7 of 22 slices shown (2 of 3)]
[im 1/22]
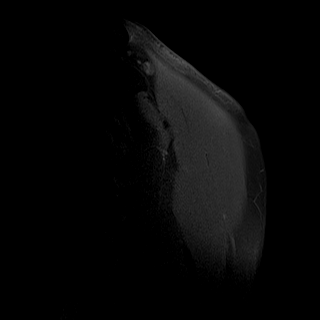
[im 4/22]
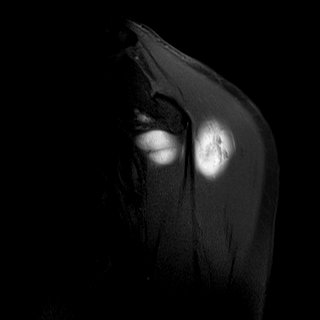
[im 8/22]
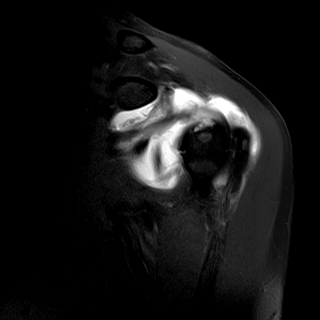
[im 11/22]
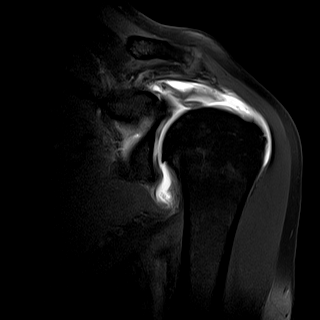
[im 15/22]
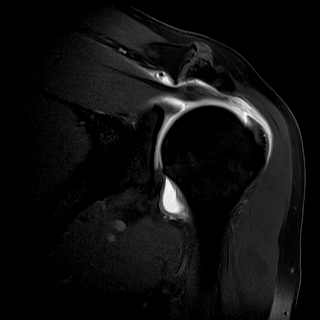
[im 18/22]
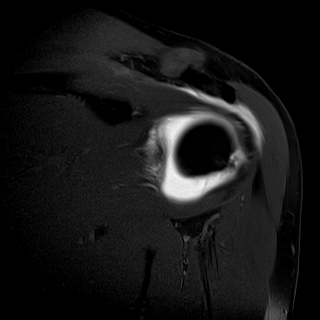
[im 22/22]
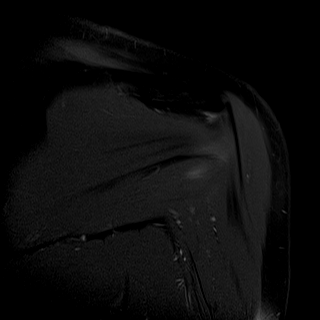

[Series 7: T2 fat-sat · coronal · 3.0mm · 0.62mm/px · 7 of 22 slices shown (1 of 2)]
[im 1/22]
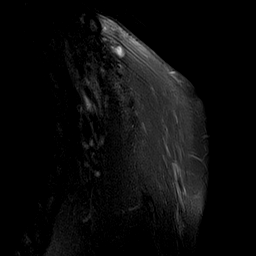
[im 4/22]
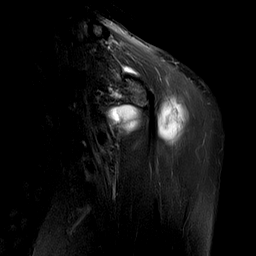
[im 8/22]
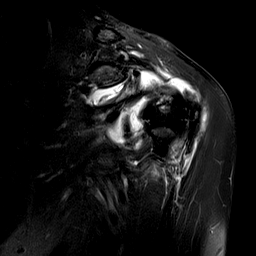
[im 11/22]
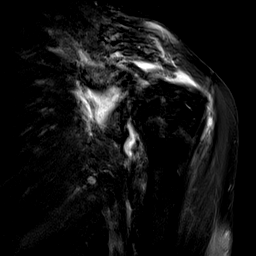
[im 15/22]
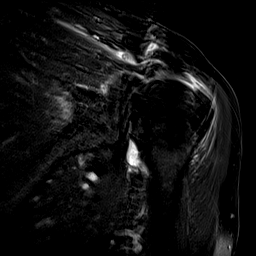
[im 18/22]
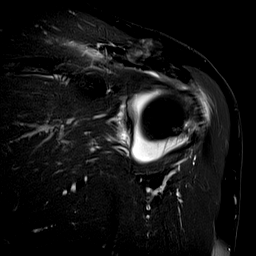
[im 22/22]
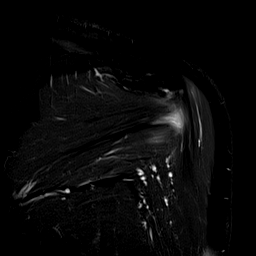

[Series 8: T1 fat-sat · coronal · non-contrast · 3.0mm · 0.62mm/px · 7 of 22 slices shown (3 of 3)]
[im 1/22]
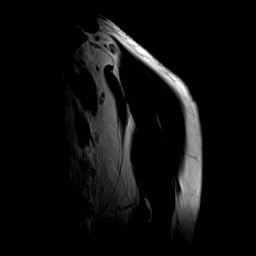
[im 4/22]
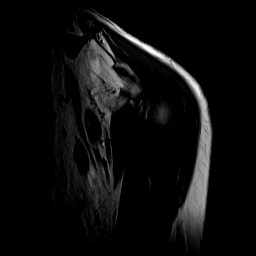
[im 8/22]
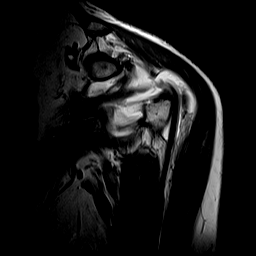
[im 11/22]
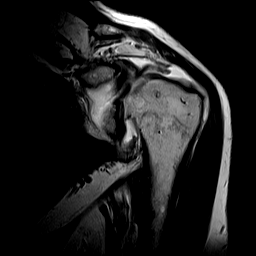
[im 15/22]
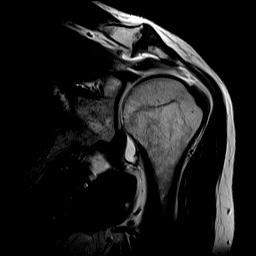
[im 18/22]
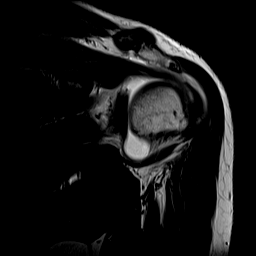
[im 22/22]
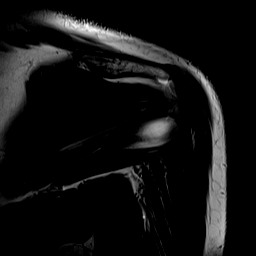

[Series 9: T2 fat-sat · oblique · 3.0mm · 0.62mm/px · 9 of 29 slices shown (2 of 2)]
[im 1/29]
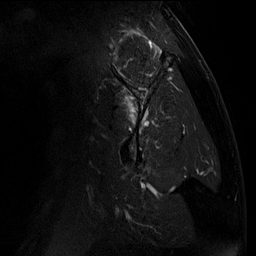
[im 4/29]
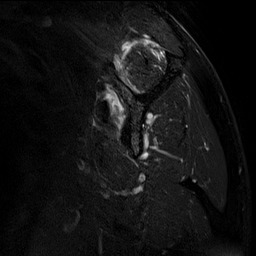
[im 8/29]
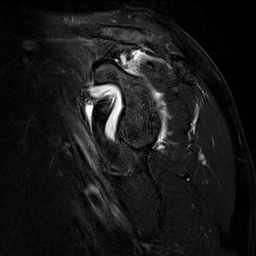
[im 11/29]
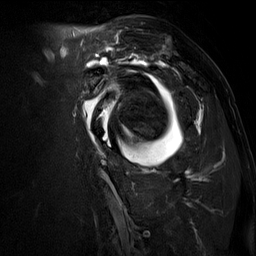
[im 15/29]
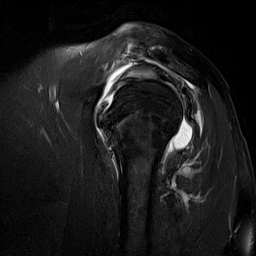
[im 18/29]
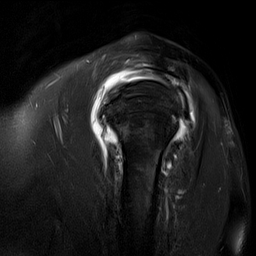
[im 22/29]
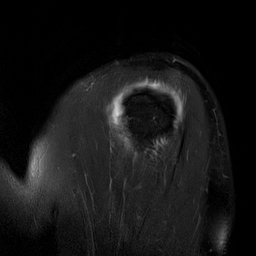
[im 25/29]
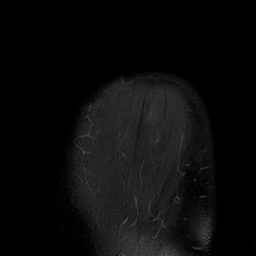
[im 29/29]
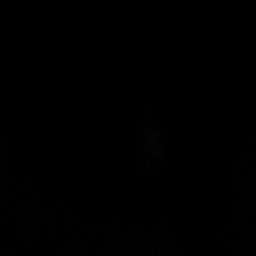

[37 of 40 positions shown; findings below may reference images not displayed]

FINDINGS: Rotator cuff: Complete full-thickness tear of the supraspinatus
tendon with up to 2.5 cm of tendon retraction. Tear likely involves
the anterior interdigitating fibers of the distal infraspinatus
tendon. Mid to posterior aspects of the distal infraspinatus tendon
remain intact. Full-thickness incomplete tear of the more superior
aspect of the subscapularis tendon with slight retraction. Teres
minor intact.

Muscles: Supraspinatus and subscapularis intramuscular edema without
muscle atrophy or fatty infiltration.

Biceps long head: Intact with intra-articular tendinosis and
suspected longitudinal split tear.

Acromioclavicular Joint: Moderate arthropathy of the AC joint. Fluid
and contrast are present within the subacromial-subdeltoid bursa.

Glenohumeral Joint: Well distended with injected contrast. Mild
diffuse chondral thinning without focal cartilage defect.

Labrum: Intact.

Bones: No acute fracture or dislocation. Reactive marrow signal
changes centered at the AC joint. No suspicious bone lesion.

Other: None.
IMPRESSION: 1. Complete full-thickness tear of the supraspinatus tendon with up
to 2.5 cm of tendon retraction. Tear likely involves the anterior
interdigitating fibers of the distal infraspinatus tendon.
2. Full-thickness incomplete tear of the more superior aspect of the
subscapularis tendon with slight retraction.
3. Intra-articular tendinosis and suspected longitudinal split tear
of the long head of the biceps tendon.
4. Moderate AC joint osteoarthritis.

## 2022-06-20 MED ORDER — ACETAMINOPHEN-CODEINE 300-30 MG PO TABS: 1.0000 | ORAL_TABLET | Freq: Four times a day (QID) | ORAL | 0 refills | Status: DC | PRN

## 2022-06-20 NOTE — Telephone Encounter (Signed)
Patient's wife called. They are at the pharmacy to pick up pain medication but the pharmacy does not have a RX. [facility] on [address]. Don't know what medication. Her call back number [phone]

## 2022-06-23 ENCOUNTER — Ambulatory Visit (HOSPITAL_BASED_OUTPATIENT_CLINIC_OR_DEPARTMENT_OTHER): Admit: 2022-06-23 | Payer: Medicare Other | Admitting: Orthopedic Surgery

## 2022-06-23 ENCOUNTER — Encounter (HOSPITAL_BASED_OUTPATIENT_CLINIC_OR_DEPARTMENT_OTHER): Payer: Self-pay

## 2022-06-23 SURGERY — RELEASE, FIRST DORSAL COMPARTMENT, HAND
Anesthesia: Regional | Site: Wrist | Laterality: Right

## 2022-06-25 ENCOUNTER — Encounter: Payer: Self-pay | Admitting: Family Medicine

## 2022-06-29 ENCOUNTER — Other Ambulatory Visit: Payer: Self-pay | Admitting: Family Medicine

## 2022-06-30 ENCOUNTER — Encounter: Payer: Self-pay | Admitting: Orthopedic Surgery

## 2022-06-30 ENCOUNTER — Ambulatory Visit (INDEPENDENT_AMBULATORY_CARE_PROVIDER_SITE_OTHER): Payer: Medicare Other | Admitting: Surgical

## 2022-06-30 DIAGNOSIS — M654 Radial styloid tenosynovitis [de Quervain]: Secondary | ICD-10-CM

## 2022-07-06 ENCOUNTER — Encounter: Payer: Self-pay | Admitting: Orthopedic Surgery

## 2022-07-06 NOTE — Progress Notes (Signed)
Post-Op Visit Note   Patient: Luis Villegas           Date of Birth: [birth year]           MRN: [id number] Visit Date: 06/30/2022 PCP: Billie Ruddy, MD   Assessment & Plan:  Chief Complaint:  Chief Complaint  Patient presents with   Right Wrist - Follow-up    Right wrist incision of extensor tendon sheath 06/20/2022   Visit Diagnoses: No diagnosis found.  Plan: Patient is a 76 year old male who presents s/p right wrist de Quervain's release on 06/20/2022.  Doing well overall.  Not taking any pain medication.  Reports significant pain relief compared with prior to surgery.  No fevers or chills.  No drainage from the incision.  He has been in postop splint.  On exam, patient has 2+ radial pulse of the operative extremity.  Intact EPL, FPL, finger abduction, finger adduction.  Incision is healing well without evidence of infection or dehiscence.  Steri-Strips in place from procedure.  There is no redness or evidence of infection surrounding the incision.  Plan is no lifting more than 5 pounds with the operative hand.  Work on achieving full range of motion of his fingers and wrist.  Follow-up in 4 weeks for clinical recheck, mostly regarding the incision.  Once the incision heals up he should be good to return to activities as tolerated.  Follow-Up Instructions: No follow-ups on file.   Orders:  No orders of the defined types were placed in this encounter.  No orders of the defined types were placed in this encounter.   Imaging: No results found.  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   Chronic kidney disease (CKD), stage III (moderate) ([place]) 04/15/2019   VENTRAL HERNIA 11/23/2008   COLONIC POLYPS, HX OF 11/23/2008   HYPERLIPIDEMIA 11/11/2007   CARPAL TUNNEL SYNDROME 11/11/2007   GERD 11/11/2007   Sleep apnea 11/11/2007   PROSTATE  CANCER, HX OF 11/11/2007   ANXIETY STATE NOS 05/18/2007   MIGRAINE NEC W/INTRACTABLE MIGRAINE 03/23/2007   ANEMIA-NOS 09/23/2006   Past Medical History:  Diagnosis Date   Allergy    Colon polyps 2016   Depression    GERD (gastroesophageal reflux disease)    Heart murmur    Hyperlipidemia    IBS (irritable bowel syndrome)    Kidney stones    stage 3    Prostate cancer ([place]) 2003   sx 2003 and radation   Sleep apnea    no cpap per pt   Sleep apnea with use of continuous positive airway pressure (CPAP)     Family History  Problem Relation Age of Onset   Stroke Father 61   Bladder Cancer Father    Breast cancer Mother    Diabetes Mother    Neuropathy Mother    Valvular heart disease Mother    Heart attack Mother 71   Prostate cancer Brother    Heart disease Brother    Heart attack Brother    Prostate cancer Brother    Colon cancer Neg Hx    Colon polyps Neg Hx    Esophageal cancer Neg Hx    Stomach cancer Neg Hx    Rectal cancer Neg Hx     Past Surgical History:  Procedure Laterality Date   CARPAL TUNNEL RELEASE Bilateral    CERVICAL FUSION  COLONOSCOPY  2007, 08/09/2014   2007 [facility]/ 2016 [place]   COLONOSCOPY W/ POLYPECTOMY  2002   POLYPECTOMY  2016   polyps x3 T.A.   PROSTATECTOMY  2003   Dr.Davis   TONSILLECTOMY     Social History   Occupational History   Occupation: Retired  Tobacco Use   Smoking status: Former    Types: Cigarettes    Quit date: 08/04/1993    Years since quitting: 28.9   Smokeless tobacco: Never  Vaping Use   Vaping Use: Never used  Substance and Sexual Activity   Alcohol use: No   Drug use: No   Sexual activity: Not on file

## 2022-08-06 ENCOUNTER — Encounter: Payer: Medicare Other | Admitting: Orthopedic Surgery

## 2022-08-18 ENCOUNTER — Encounter: Payer: Medicare Other | Admitting: Orthopedic Surgery

## 2022-08-26 DIAGNOSIS — Z961 Presence of intraocular lens: Secondary | ICD-10-CM | POA: Diagnosis not present

## 2022-08-26 DIAGNOSIS — H16223 Keratoconjunctivitis sicca, not specified as Sjogren's, bilateral: Secondary | ICD-10-CM | POA: Diagnosis not present

## 2022-08-26 DIAGNOSIS — H1013 Acute atopic conjunctivitis, bilateral: Secondary | ICD-10-CM | POA: Diagnosis not present

## 2022-09-01 ENCOUNTER — Ambulatory Visit (INDEPENDENT_AMBULATORY_CARE_PROVIDER_SITE_OTHER): Payer: Medicare Other | Admitting: Family Medicine

## 2022-09-01 VITALS — BP 120/60 | HR 75 | Temp 98.5°F | Ht 68.0 in | Wt 165.2 lb

## 2022-09-01 DIAGNOSIS — K047 Periapical abscess without sinus: Secondary | ICD-10-CM | POA: Diagnosis not present

## 2022-09-01 DIAGNOSIS — E782 Mixed hyperlipidemia: Secondary | ICD-10-CM | POA: Diagnosis not present

## 2022-09-01 DIAGNOSIS — W19XXXA Unspecified fall, initial encounter: Secondary | ICD-10-CM | POA: Diagnosis not present

## 2022-09-01 DIAGNOSIS — R410 Disorientation, unspecified: Secondary | ICD-10-CM

## 2022-09-01 DIAGNOSIS — R29898 Other symptoms and signs involving the musculoskeletal system: Secondary | ICD-10-CM | POA: Diagnosis not present

## 2022-09-01 DIAGNOSIS — R600 Localized edema: Secondary | ICD-10-CM

## 2022-09-01 DIAGNOSIS — R300 Dysuria: Secondary | ICD-10-CM | POA: Diagnosis not present

## 2022-09-01 LAB — POCT URINALYSIS DIPSTICK
Bilirubin, UA: POSITIVE
Blood, UA: POSITIVE
Glucose, UA: NEGATIVE
Ketones, UA: NEGATIVE
Leukocytes, UA: NEGATIVE
Nitrite, UA: NEGATIVE
Protein, UA: POSITIVE — AB
Urobilinogen, UA: 0.2 E.U./dL
pH, UA: 5 (ref 5.0–8.0)

## 2022-09-01 LAB — CBC WITH DIFFERENTIAL/PLATELET
Basophils Absolute: 0 10*3/uL (ref 0.0–0.1)
Basophils Relative: 0.3 % (ref 0.0–3.0)
Eosinophils Absolute: 0 10*3/uL (ref 0.0–0.7)
Eosinophils Relative: 0.3 % (ref 0.0–5.0)
HCT: 32.3 % — ABNORMAL LOW (ref 39.0–52.0)
Hemoglobin: 10.5 g/dL — ABNORMAL LOW (ref 13.0–17.0)
Lymphocytes Relative: 12.8 % (ref 12.0–46.0)
Lymphs Abs: 1.3 10*3/uL (ref 0.7–4.0)
MCHC: 32.4 g/dL (ref 30.0–36.0)
MCV: 84.7 fl (ref 78.0–100.0)
Monocytes Absolute: 1.2 10*3/uL — ABNORMAL HIGH (ref 0.1–1.0)
Monocytes Relative: 11.7 % (ref 3.0–12.0)
Neutro Abs: 7.8 10*3/uL — ABNORMAL HIGH (ref 1.4–7.7)
Neutrophils Relative %: 74.9 % (ref 43.0–77.0)
Platelets: 266 10*3/uL (ref 150.0–400.0)
RBC: 3.81 Mil/uL — ABNORMAL LOW (ref 4.22–5.81)
RDW: 16.7 % — ABNORMAL HIGH (ref 11.5–15.5)
WBC: 10.4 10*3/uL (ref 4.0–10.5)

## 2022-09-01 LAB — COMPREHENSIVE METABOLIC PANEL
ALT: 9 U/L (ref 0–53)
AST: 14 U/L (ref 0–37)
Albumin: 4.6 g/dL (ref 3.5–5.2)
Alkaline Phosphatase: 48 U/L (ref 39–117)
BUN: 19 mg/dL (ref 6–23)
CO2: 23 mEq/L (ref 19–32)
Calcium: 9.7 mg/dL (ref 8.4–10.5)
Chloride: 107 mEq/L (ref 96–112)
Creatinine, Ser: 1.59 mg/dL — ABNORMAL HIGH (ref 0.40–1.50)
GFR: 42.22 mL/min — ABNORMAL LOW (ref >60.00)
Glucose, Bld: 104 mg/dL — ABNORMAL HIGH (ref 70–99)
Potassium: 4.2 mEq/L (ref 3.5–5.1)
Sodium: 139 mEq/L (ref 135–145)
Total Bilirubin: 0.3 mg/dL (ref 0.2–1.2)
Total Protein: 8 g/dL (ref 6.0–8.3)

## 2022-09-01 LAB — LIPID PANEL
Cholesterol: 134 mg/dL (ref 0–200)
HDL: 51.6 mg/dL (ref >39.00)
LDL Cholesterol: 62 mg/dL (ref 0–99)
NonHDL: 82.3
Total CHOL/HDL Ratio: 3
Triglycerides: 104 mg/dL (ref 0.0–149.0)
VLDL: 20.8 mg/dL (ref 0.0–40.0)

## 2022-09-01 LAB — VITAMIN B12: Vitamin B-12: 148 pg/mL — ABNORMAL LOW (ref 211–911)

## 2022-09-01 LAB — MAGNESIUM: Magnesium: 2.5 mg/dL (ref 1.5–2.5)

## 2022-09-01 LAB — TSH: TSH: 0.62 u[IU]/mL (ref 0.35–5.50)

## 2022-09-01 MED ORDER — AMOXICILLIN 500 MG PO TABS: 500.0000 mg | ORAL_TABLET | Freq: Two times a day (BID) | ORAL | 0 refills | Status: AC

## 2022-09-01 NOTE — Progress Notes (Signed)
Established Patient Office Visit   Subjective  Patient ID: Luis Villegas, male    DOB: October 15, 1945  Age: 77 y.o. MRN: [id number]  Chief Complaint  Patient presents with   Gait Problem    Pt is accompanied by wife. Wife reports husband gait has changed. Noticed walking slower few days ago. Worsen yesterday, wife states husband's leg gave out yesterday and he fell into sofa. She added that pt fell last night in bathroom.   Edema    Pt reports swelling on L side of face. He reports his dental bridge fell out 3-4 days ago. Feeling pain on where his bridge fell off.   Patient is accompanied by his wife  Patient is a 77 year old male who with acute concerns.  Per patient's wife patient left-sided face edema x 3 to 4 days.  Was unsure if related to upper left bridge falling out.  Has appointment with dentist on 09/15/2022.  Tried ice, salt water rinse.  Patient's wife also concerned with recent confusion and episode of inability to move feet.  Patient unable to move feet to walk.  Appeared as if he were going to sit.  Patient's wife help get him to the couch where he fell and was unable to reposition himself on the couch.  Patient had a fall last night while in the bathroom.  Patient was confused and thought it was morning.  Went to the bathroom shade and slipped and fell onto the floor.  Patient does not recall being unable to move.  Patient denies changes in medications, over-the-counter supplements, pain.  May have noticed some dysuria.      ROS Negative unless stated above    Objective:     BP 120/60 (BP Location: Left Arm, Patient Position: Sitting, Cuff Size: Normal)   Pulse 75   Temp 98.5 F (36.9 C) (Oral)   Ht '5\' 8"'$  (1.727 m)   Wt 165 lb 3.2 oz (74.9 kg)   SpO2 97%   BMI 25.12 kg/m    Physical Exam Constitutional:      General: He is not in acute distress.    Appearance: Normal appearance. He is not ill-appearing.  HENT:     Head: Normocephalic and atraumatic.      Jaw: There is normal jaw occlusion. Tenderness and swelling present.     Salivary Glands: Right salivary gland is not diffusely enlarged. Left salivary gland is not diffusely enlarged or tender.      Comments: Left-sided facial edema.    Right Ear: Tympanic membrane normal.     Left Ear: Tympanic membrane normal.     Nose: Nose normal.     Mouth/Throat:     Mouth: Mucous membranes are moist.      Comments: Left upper 1st molar worn down, mild gingival irritation/erythema noted, bridge not in place. Eyes:     Extraocular Movements: Extraocular movements intact.     Conjunctiva/sclera: Conjunctivae normal.     Pupils: Pupils are equal, round, and reactive to light.  Cardiovascular:     Rate and Rhythm: Normal rate and regular rhythm.     Pulses: Normal pulses.     Heart sounds: Normal heart sounds.  Pulmonary:     Breath sounds: Normal breath sounds.  Abdominal:     General: Bowel sounds are normal.     Palpations: Abdomen is soft.  Musculoskeletal:        General: Normal range of motion.     Cervical back: Normal range of motion.  No tenderness.  Lymphadenopathy:     Cervical: No cervical adenopathy.  Skin:    General: Skin is warm and dry.  Neurological:     Mental Status: He is alert and oriented to person, place, and time.  Psychiatric:        Mood and Affect: Mood and affect normal.        Behavior: Behavior is slowed. Behavior is cooperative.        Thought Content: Thought content normal.     Comments: Slightly impaired cognition.      Results for orders placed or performed in visit on 09/01/22  Culture, Urine   Specimen: Urine  Result Value Ref Range   MICRO NUMBER: [id number]    SPECIMEN QUALITY: Adequate    Sample Source URINE, CLEAN CATCH    STATUS: FINAL    Result: No Growth   CBC with Differential/Platelet  Result Value Ref Range   WBC 10.4 4.0 - 10.5 K/uL   RBC 3.81 (L) 4.22 - 5.81 Mil/uL   Hemoglobin 10.5 (L) 13.0 - 17.0 g/dL   HCT 32.3 (L) 39.0  - 52.0 %   MCV 84.7 78.0 - 100.0 fl   MCHC 32.4 30.0 - 36.0 g/dL   RDW 16.7 (H) 11.5 - 15.5 %   Platelets 266.0 150.0 - 400.0 K/uL   Neutrophils Relative % 74.9 43.0 - 77.0 %   Lymphocytes Relative 12.8 12.0 - 46.0 %   Monocytes Relative 11.7 3.0 - 12.0 %   Eosinophils Relative 0.3 0.0 - 5.0 %   Basophils Relative 0.3 0.0 - 3.0 %   Neutro Abs 7.8 (H) 1.4 - 7.7 K/uL   Lymphs Abs 1.3 0.7 - 4.0 K/uL   Monocytes Absolute 1.2 (H) 0.1 - 1.0 K/uL   Eosinophils Absolute 0.0 0.0 - 0.7 K/uL   Basophils Absolute 0.0 0.0 - 0.1 K/uL  CMP  Result Value Ref Range   Sodium 139 135 - 145 mEq/L   Potassium 4.2 3.5 - 5.1 mEq/L   Chloride 107 96 - 112 mEq/L   CO2 23 19 - 32 mEq/L   Glucose, Bld 104 (H) 70 - 99 mg/dL   BUN 19 6 - 23 mg/dL   Creatinine, Ser 1.59 (H) 0.40 - 1.50 mg/dL   Total Bilirubin 0.3 0.2 - 1.2 mg/dL   Alkaline Phosphatase 48 39 - 117 U/L   AST 14 0 - 37 U/L   ALT 9 0 - 53 U/L   Total Protein 8.0 6.0 - 8.3 g/dL   Albumin 4.6 3.5 - 5.2 g/dL   GFR 42.22 (L) >60.00 mL/min   Calcium 9.7 8.4 - 10.5 mg/dL  TSH  Result Value Ref Range   TSH 0.62 0.35 - 5.50 uIU/mL  Magnesium  Result Value Ref Range   Magnesium 2.5 1.5 - 2.5 mg/dL  Lipid panel  Result Value Ref Range   Cholesterol 134 0 - 200 mg/dL   Triglycerides 104.0 0.0 - 149.0 mg/dL   HDL 51.60 >39.00 mg/dL   VLDL 20.8 0.0 - 40.0 mg/dL   LDL Cholesterol 62 0 - 99 mg/dL   Total CHOL/HDL Ratio 3    NonHDL 82.30   Vitamin B12  Result Value Ref Range   Vitamin B-12 148 (L) 211 - 911 pg/mL  POCT urinalysis dipstick  Result Value Ref Range   Color, UA Dark Yellow    Clarity, UA Clear    Glucose, UA Negative Negative   Bilirubin, UA positive    Ketones, UA Negative  Spec Grav, UA     Blood, UA Positive    pH, UA 5.0 5.0 - 8.0   Protein, UA Positive (A) Negative   Urobilinogen, UA 0.2 0.2 or 1.0 E.U./dL   Nitrite, UA Negative    Leukocytes, UA Negative Negative   Appearance     Odor        Assessment & Plan:   Confusion -     CBC with Differential/Platelet -     Comprehensive metabolic panel -     POCT urinalysis dipstick -     Vitamin B12 -     CT HEAD WO CONTRAST (5MM); Future  Weakness of both lower extremities -     TSH -     Magnesium -     Vitamin B12 -     CT HEAD WO CONTRAST (5MM); Future  Facial edema -     CBC with Differential/Platelet -     Comprehensive metabolic panel -     CT HEAD WO CONTRAST (5MM); Future -     CT MAXILLOFACIAL WO CONTRAST; Future  Fall from standing, initial encounter -     CT HEAD WO CONTRAST (5MM); Future -     CT MAXILLOFACIAL WO CONTRAST; Future  Dysuria -     POCT urinalysis dipstick -     Urine Culture; Future  Dental infection -     Amoxicillin; Take 1 tablet (500 mg total) by mouth 2 (two) times daily for 7 days.  Dispense: 14 tablet; Refill: 0 -     Lipid panel  Mixed hyperlipidemia -     TSH -     Lipid panel  Discussed various causes of patient's confusion/change in behavior such as infection, medications, electrolyte or vitamin abnormality.  Obtain labs to rule out reversible causes of symptoms.  Will also obtain UA to evaluate for possible UTI.  Start amoxicillin for dental infection.  Given fall will obtain CT head.  Given strict precautions.  For continued or worsening symptoms proceed to nearest ED.  Return if symptoms worsen or fail to improve.   Billie Ruddy, MD

## 2022-09-02 DIAGNOSIS — R300 Dysuria: Secondary | ICD-10-CM | POA: Diagnosis not present

## 2022-09-03 LAB — URINE CULTURE
MICRO NUMBER:: 14493153
Result:: NO GROWTH
SPECIMEN QUALITY:: ADEQUATE

## 2022-09-04 ENCOUNTER — Emergency Department (HOSPITAL_COMMUNITY)
Admission: EM | Admit: 2022-09-04 | Discharge: 2022-09-05 | Disposition: A | Discharge status: Home / Self Care | Payer: Medicare Other | Attending: Emergency Medicine | Admitting: Emergency Medicine

## 2022-09-04 ENCOUNTER — Other Ambulatory Visit: Payer: Self-pay

## 2022-09-04 DIAGNOSIS — N189 Chronic kidney disease, unspecified: Secondary | ICD-10-CM | POA: Insufficient documentation

## 2022-09-04 DIAGNOSIS — Z1152 Encounter for screening for COVID-19: Secondary | ICD-10-CM | POA: Diagnosis not present

## 2022-09-04 DIAGNOSIS — R2681 Unsteadiness on feet: Secondary | ICD-10-CM | POA: Diagnosis not present

## 2022-09-04 DIAGNOSIS — G319 Degenerative disease of nervous system, unspecified: Secondary | ICD-10-CM | POA: Diagnosis not present

## 2022-09-04 DIAGNOSIS — S0990XA Unspecified injury of head, initial encounter: Secondary | ICD-10-CM | POA: Diagnosis not present

## 2022-09-04 DIAGNOSIS — R531 Weakness: Secondary | ICD-10-CM

## 2022-09-04 DIAGNOSIS — I959 Hypotension, unspecified: Secondary | ICD-10-CM | POA: Diagnosis not present

## 2022-09-04 DIAGNOSIS — R404 Transient alteration of awareness: Secondary | ICD-10-CM

## 2022-09-04 DIAGNOSIS — J111 Influenza due to unidentified influenza virus with other respiratory manifestations: Secondary | ICD-10-CM

## 2022-09-04 DIAGNOSIS — J101 Influenza due to other identified influenza virus with other respiratory manifestations: Secondary | ICD-10-CM | POA: Diagnosis not present

## 2022-09-04 DIAGNOSIS — R262 Difficulty in walking, not elsewhere classified: Secondary | ICD-10-CM | POA: Diagnosis not present

## 2022-09-04 DIAGNOSIS — W19XXXA Unspecified fall, initial encounter: Secondary | ICD-10-CM | POA: Diagnosis not present

## 2022-09-04 LAB — CBC WITH DIFFERENTIAL/PLATELET
Abs Immature Granulocytes: 0.01 10*3/uL (ref 0.00–0.07)
Basophils Absolute: 0 10*3/uL (ref 0.0–0.1)
Basophils Relative: 0 %
Eosinophils Absolute: 0 10*3/uL (ref 0.0–0.5)
Eosinophils Relative: 1 %
HCT: 32.4 % — ABNORMAL LOW (ref 39.0–52.0)
Hemoglobin: 9.5 g/dL — ABNORMAL LOW (ref 13.0–17.0)
Immature Granulocytes: 0 %
Lymphocytes Relative: 8 %
Lymphs Abs: 0.4 10*3/uL — ABNORMAL LOW (ref 0.7–4.0)
MCH: 26.7 pg (ref 26.0–34.0)
MCHC: 29.3 g/dL — ABNORMAL LOW (ref 30.0–36.0)
MCV: 91 fL (ref 80.0–100.0)
Monocytes Absolute: 0.5 10*3/uL (ref 0.1–1.0)
Monocytes Relative: 12 %
Neutro Abs: 3.3 10*3/uL (ref 1.7–7.7)
Neutrophils Relative %: 79 %
Platelets: 259 10*3/uL (ref 150–400)
RBC: 3.56 MIL/uL — ABNORMAL LOW (ref 4.22–5.81)
RDW: 15.9 % — ABNORMAL HIGH (ref 11.5–15.5)
WBC: 4.2 10*3/uL (ref 4.0–10.5)
nRBC: 0 % (ref 0.0–0.2)

## 2022-09-04 LAB — COMPREHENSIVE METABOLIC PANEL
ALT: 12 U/L (ref 0–44)
AST: 17 U/L (ref 15–41)
Albumin: 3.8 g/dL (ref 3.5–5.0)
Alkaline Phosphatase: 47 U/L (ref 38–126)
Anion gap: 10 (ref 5–15)
BUN: 26 mg/dL — ABNORMAL HIGH (ref 8–23)
CO2: 21 mmol/L — ABNORMAL LOW (ref 22–32)
Calcium: 9.4 mg/dL (ref 8.9–10.3)
Chloride: 107 mmol/L (ref 98–111)
Creatinine, Ser: 1.54 mg/dL — ABNORMAL HIGH (ref 0.61–1.24)
GFR, Estimated: 47 mL/min — ABNORMAL LOW (ref >60)
Glucose, Bld: 118 mg/dL — ABNORMAL HIGH (ref 70–99)
Potassium: 4.1 mmol/L (ref 3.5–5.1)
Sodium: 138 mmol/L (ref 135–145)
Total Bilirubin: 0.2 mg/dL — ABNORMAL LOW (ref 0.3–1.2)
Total Protein: 7.9 g/dL (ref 6.5–8.1)

## 2022-09-04 LAB — RESP PANEL BY RT-PCR (RSV, FLU A&B, COVID)  RVPGX2
Influenza A by PCR: POSITIVE — AB
Influenza B by PCR: NEGATIVE
Resp Syncytial Virus by PCR: NEGATIVE
SARS Coronavirus 2 by RT PCR: NEGATIVE

## 2022-09-04 LAB — CBG MONITORING, ED: Glucose-Capillary: 124 mg/dL — ABNORMAL HIGH (ref 70–99)

## 2022-09-04 LAB — LACTIC ACID, PLASMA: Lactic Acid, Venous: 1.2 mmol/L (ref 0.5–1.9)

## 2022-09-04 MED ORDER — SODIUM CHLORIDE 0.9 % IV BOLUS
500.0000 mL | Freq: Once | INTRAVENOUS | Status: AC
  Administered 2022-09-04: 500 mL via INTRAVENOUS

## 2022-09-04 MED ORDER — ACETAMINOPHEN 325 MG PO TABS
650.0000 mg | ORAL_TABLET | Freq: Once | ORAL | Status: AC
  Administered 2022-09-04: 650 mg via ORAL
  Filled 2022-09-04: qty 2

## 2022-09-04 NOTE — ED Triage Notes (Addendum)
via EMS for fall and confusion that started Sunday. Was given abx for UTI at PCP. Presents for additional fall today, PCP concerned for TIA. No focal neuro deficits noted by EMS.  EMS asess: A&Ox3 (situation slow to answer) 160/70, 88, 24RR, 95RA%, 130CBG, 98.37F  Arrives A&Ox4, febrile (101.2, last tylenol last night)  Denies N/V/D Endorses HA

## 2022-09-04 NOTE — ED Notes (Signed)
Spoke to daughter at bedside, she clarifies that though patient is answering orientation questions correctly, he is not himself cognitively. Gives example of him waking in the middle of the night and falling because he had tried to get up to shave. He thought it was morningtime

## 2022-09-05 ENCOUNTER — Emergency Department (HOSPITAL_COMMUNITY): Payer: Medicare Other

## 2022-09-05 DIAGNOSIS — G319 Degenerative disease of nervous system, unspecified: Secondary | ICD-10-CM | POA: Diagnosis not present

## 2022-09-05 DIAGNOSIS — J101 Influenza due to other identified influenza virus with other respiratory manifestations: Secondary | ICD-10-CM | POA: Diagnosis not present

## 2022-09-05 DIAGNOSIS — S0990XA Unspecified injury of head, initial encounter: Secondary | ICD-10-CM | POA: Diagnosis not present

## 2022-09-05 MED ORDER — LACTATED RINGERS IV BOLUS
1000.0000 mL | Freq: Once | INTRAVENOUS | Status: AC
  Administered 2022-09-05: 1000 mL via INTRAVENOUS

## 2022-09-05 MED ORDER — OSELTAMIVIR PHOSPHATE 75 MG PO CAPS
75.0000 mg | ORAL_CAPSULE | Freq: Once | ORAL | Status: AC
  Administered 2022-09-05: 75 mg via ORAL
  Filled 2022-09-05: qty 1

## 2022-09-05 MED ORDER — LACTATED RINGERS IV SOLN: INTRAVENOUS | Status: AC

## 2022-09-05 MED ORDER — ONDANSETRON 4 MG PO TBDP: 4.0000 mg | ORAL_TABLET | Freq: Three times a day (TID) | ORAL | 0 refills | Status: AC | PRN

## 2022-09-05 MED ORDER — ACETAMINOPHEN 500 MG PO TABS: 500.0000 mg | ORAL_TABLET | Freq: Four times a day (QID) | ORAL | Status: DC | PRN

## 2022-09-05 MED ORDER — IBUPROFEN 200 MG PO TABS
600.0000 mg | ORAL_TABLET | Freq: Once | ORAL | Status: AC
  Administered 2022-09-05: 600 mg via ORAL
  Filled 2022-09-05: qty 3

## 2022-09-05 NOTE — ED Notes (Signed)
Returned from CT.

## 2022-09-05 NOTE — ED Provider Notes (Signed)
I assumed care of this patient.  Please see previous provider note for further details of Hx, PE.  Briefly patient is a 77 y.o. male who presented with altered mental status found to be influenza positive.  Currently pending CT head.  CT head negative.  Plan was to likely admit the patient.  After reassessment, the patient's fever had defervesced and his mental status had improved.  He is requesting that he does not be admitted and would like to go home.  I monitored the patient throughout the night and he remained stable.  He was ambulated without complication.  Wife came in in the morning and did see that the patient has significantly improved.  They are both comfortable with patient being discharged home.  The patient appears reasonably screened and/or stabilized for discharge and I doubt any other medical condition or other [facility] requiring further screening, evaluation, or treatment in the ED at this time. I have discussed the findings, Dx and Tx plan with the patient/family who expressed understanding and agree(s) with the plan. Discharge instructions discussed at length. The patient/family was given strict return precautions who verbalized understanding of the instructions. No further questions at time of discharge.  Disposition: Discharge  Condition: Good  ED Discharge Orders          Ordered    ondansetron (ZOFRAN-ODT) 4 MG disintegrating tablet  Every 8 hours PRN        09/05/22 0739             Follow Up: Billie Ruddy, MD [place] [address] [phone]  Call  to schedule an appointment for close follow up         Fatima Blank, MD 09/05/22 [phone]

## 2022-09-05 NOTE — ED Notes (Signed)
Transported to CT 

## 2022-09-05 NOTE — ED Provider Notes (Signed)
[place] EMERGENCY DEPARTMENT AT [facility] Provider Note   CSN: [id number] Arrival date & time: 09/04/22  2054     History  Chief Complaint  Patient presents with   Altered Mental Status    Luis Villegas is a 77 y.o. male.  HPI    77 year old patient comes in with chief complaint of altered mental status. Patient has history of hyperlipidemia, CKD.  According to the patient's wife, on Sunday patient had an episode where he was confused and had a blank stare.  He was profoundly weak.  That night he fell down.  The episode of confusion and blank stare along with weakness lasted for about an hour.  The next day they went saw PCP.  There was complaints from the patient of burning with urination, she started him on amoxicillin as patient was also having some dental complaints.  The thought was that most likely the symptoms were because of infection versus TIA.  Patient was doing well on Tuesday, Wednesday, but at 5 PM on Thursday he again had a fall and was confused.   Home Medications Prior to Admission medications   Medication Sig Start Date End Date Taking? Authorizing Provider  acetaminophen-codeine (TYLENOL #3) 300-30 MG tablet Take 1 tablet by mouth every 6 (six) hours as needed for moderate pain. 06/20/22   Magnant, Charles L, PA-C  acyclovir (ZOVIRAX) 200 MG capsule TAKE 1 CAPSULE(200 MG) BY MOUTH DAILY 06/30/22   Billie Ruddy, MD  amoxicillin (AMOXIL) 500 MG tablet Take 1 tablet (500 mg total) by mouth 2 (two) times daily for 7 days. 09/01/22 09/08/22  Billie Ruddy, MD  buPROPion (WELLBUTRIN XL) 150 MG 24 hr tablet TAKE 1 TABLET(150 MG) BY MOUTH DAILY 06/30/22   Billie Ruddy, MD  citalopram (CELEXA) 20 MG tablet TAKE 1 TABLET(20 MG) BY MOUTH DAILY 06/30/22   Billie Ruddy, MD  ezetimibe-simvastatin (VYTORIN) 10-40 MG tablet TAKE 1 TABLET BY MOUTH AT BEDTIME 06/30/22   Billie Ruddy, MD  loperamide (IMODIUM A-D) 2 MG tablet Use a 1/2  tablet every morning and titrate as needed. 06/11/22   Zehr, Laban Emperor, PA-C  Multiple Vitamin (MULTIVITAMIN ADULT PO) Take by mouth.    [provider]  omeprazole (PRILOSEC) 20 MG capsule TAKE 1 CAPSULE BY MOUTH EVERY DAY 04/01/22   Billie Ruddy, MD  SUMAtriptan (IMITREX) 100 MG tablet TAKE 1 TABLET BY MOUTH AS NEEDED FOR HEADACHE 11/22/20   Billie Ruddy, MD  tamsulosin (FLOMAX) 0.4 MG CAPS capsule Take 0.4 mg by mouth daily. 06/24/21   [provider]  topiramate (TOPAMAX) 200 MG tablet TAKE 1 TABLET(200 MG) BY MOUTH AT BEDTIME 04/01/22   Billie Ruddy, MD      Allergies    Lipitor [atorvastatin calcium]    Review of Systems   Review of Systems  Physical Exam Updated Vital Signs BP (!) 141/112   Pulse 86   Temp (!) 102.9 F (39.4 C) (Oral)   Resp 17   Wt 75 kg   SpO2 94%   BMI 25.14 kg/m  Physical Exam Vitals and nursing note reviewed.  Constitutional:      Appearance: He is well-developed. He is ill-appearing.  HENT:     Head: Atraumatic.  Eyes:     Extraocular Movements: Extraocular movements intact.     Pupils: Pupils are equal, round, and reactive to light.  Cardiovascular:     Rate and Rhythm: Normal rate.  Pulmonary:  Effort: Pulmonary effort is normal.  Musculoskeletal:     Cervical back: Neck supple.  Skin:    General: Skin is warm.  Neurological:     Mental Status: He is alert and oriented to person, place, and time.     Cranial Nerves: No cranial nerve deficit.     Sensory: No sensory deficit.     Motor: No weakness.     Coordination: Coordination normal.     ED Results / Procedures / Treatments   Labs (all labs ordered are listed, but only abnormal results are displayed) Labs Reviewed  RESP PANEL BY RT-PCR (RSV, FLU A&B, COVID)  RVPGX2 - Abnormal; Notable for the following components:      Result Value   Influenza A by PCR POSITIVE (*)    All other components within normal limits  CBC WITH DIFFERENTIAL/PLATELET -  Abnormal; Notable for the following components:   RBC 3.56 (*)    Hemoglobin 9.5 (*)    HCT 32.4 (*)    MCHC 29.3 (*)    RDW 15.9 (*)    Lymphs Abs 0.4 (*)    All other components within normal limits  COMPREHENSIVE METABOLIC PANEL - Abnormal; Notable for the following components:   CO2 21 (*)    Glucose, Bld 118 (*)    BUN 26 (*)    Creatinine, Ser 1.54 (*)    Total Bilirubin 0.2 (*)    GFR, Estimated 47 (*)    All other components within normal limits  CBG MONITORING, ED - Abnormal; Notable for the following components:   Glucose-Capillary 124 (*)    All other components within normal limits  LACTIC ACID, PLASMA  LACTIC ACID, PLASMA    EKG EKG Interpretation  Date/Time:  Thursday September 04 2022 21:11:45 EST Ventricular Rate:  88 PR Interval:  159 QRS Duration: 95 QT Interval:  337 QTC Calculation: 408 R Axis:   73 Text Interpretation: Sinus rhythm Atrial premature complex Minimal ST elevation, anterior leads No acute changes No significant change since last tracing Confirmed by Varney Biles [phone]) on 09/04/2022 9:36:01 PM  Radiology No results found.  Procedures Procedures    Medications Ordered in ED Medications  lactated ringers bolus 1,000 mL (has no administration in time range)  lactated ringers infusion (has no administration in time range)  ibuprofen (ADVIL) tablet 600 mg (has no administration in time range)  acetaminophen (TYLENOL) tablet 500 mg (has no administration in time range)  oseltamivir (TAMIFLU) capsule 75 mg (has no administration in time range)  sodium chloride 0.9 % bolus 500 mL (0 mLs Intravenous Stopped 09/05/22 0023)  acetaminophen (TYLENOL) tablet 650 mg (650 mg Oral Given 09/04/22 2316)    ED Course/ Medical Decision Making/ A&P                             Medical Decision Making 77 year old patient comes in with chief complaint of mechanical fall, confusion.  He is noted to have a fever.  History provided by the patient's  daughter, who is a Marine scientist and also patient's wife.  I also reviewed PCP note from earlier this week.  Differential diagnosis includes: ICH / Stroke, acute coronary syndrome, Infection -URI/UTI/Pneumonia/soft tissue infection leading to encephalopathy or delirium, encephalopathy due to electrolyte abnormality or drug interactions or toxins or metabolic conditions like adrenal insufficiency, hyperglycemia, paraneoplastic process  No acute neurodeficits. Does not clinically appear to be a global amnesia, the but that is another  possibility.  Patient however noted to have a fever, therefore infection route most likely culprit.  Appropriate labs ordered.  There is no evidence of organ dysfunction, severe electrolyte abnormality.  Patient does have positive flu.  Given patient's significant weakness, confusion, high risk for fall and poor social situation -his wife was unable to help him after he had a fall, we will admit him to the hospital.  Amount and/or Complexity of Data Reviewed Labs: ordered. Radiology: ordered.  Risk OTC drugs. Prescription drug management. Decision regarding hospitalization.    Final Clinical Impression(s) / ED Diagnoses Final diagnoses:  Transient alteration of awareness  Influenza  Generalized weakness    Rx / DC Orders ED Discharge Orders     None         Varney Biles, MD 09/05/22 [phone]

## 2022-09-08 ENCOUNTER — Other Ambulatory Visit: Payer: Self-pay | Admitting: Family Medicine

## 2022-09-09 ENCOUNTER — Ambulatory Visit (INDEPENDENT_AMBULATORY_CARE_PROVIDER_SITE_OTHER): Payer: Medicare Other | Admitting: *Deleted

## 2022-09-09 DIAGNOSIS — E538 Deficiency of other specified B group vitamins: Secondary | ICD-10-CM | POA: Diagnosis not present

## 2022-09-09 MED ORDER — CYANOCOBALAMIN 1000 MCG/ML IJ SOLN
1000.0000 ug | Freq: Once | INTRAMUSCULAR | Status: AC
  Administered 2022-09-09: 1000 ug via INTRAMUSCULAR

## 2022-09-09 NOTE — Progress Notes (Signed)
Per orders of Dr. Jordan, injection of Cyanocobalamin 1000mcg given by Joshalyn Ancheta A. Patient tolerated injection well.  

## 2022-09-12 ENCOUNTER — Telehealth: Payer: Self-pay

## 2022-09-12 NOTE — Telephone Encounter (Signed)
        Patient  visited [facility] on 2/1     Telephone encounter attempt :  1st  A HIPAA compliant voice message was left requesting a return call.  Instructed patient to call back .    [place] [phone] [address], [place], [place] [address] Phone: [phone] Email: [email or web address]

## 2022-09-15 ENCOUNTER — Telehealth: Payer: Self-pay

## 2022-09-15 NOTE — Telephone Encounter (Signed)
     Patient  visit on 2/2  at [facility] you been able to follow up with your primary care physician? NO  The patient was or was not able to obtain any needed medicine or equipment. YES  Are there diet recommendations that you are having difficulty following?NA  Patient expresses understanding of discharge instructions and education provided has no other needs at this time. [place], [place] [phone] [address], [place], [place] [address] Phone: [phone] Email: [email or web address]

## 2022-09-17 ENCOUNTER — Ambulatory Visit (INDEPENDENT_AMBULATORY_CARE_PROVIDER_SITE_OTHER): Payer: Medicare Other | Admitting: Family Medicine

## 2022-09-17 VITALS — BP 92/50 | HR 64 | Temp 98.7°F | Ht 68.0 in | Wt 165.0 lb

## 2022-09-17 DIAGNOSIS — R269 Unspecified abnormalities of gait and mobility: Secondary | ICD-10-CM | POA: Diagnosis not present

## 2022-09-17 DIAGNOSIS — R351 Nocturia: Secondary | ICD-10-CM | POA: Diagnosis not present

## 2022-09-17 DIAGNOSIS — I951 Orthostatic hypotension: Secondary | ICD-10-CM | POA: Diagnosis not present

## 2022-09-17 DIAGNOSIS — E538 Deficiency of other specified B group vitamins: Secondary | ICD-10-CM | POA: Diagnosis not present

## 2022-09-17 DIAGNOSIS — D649 Anemia, unspecified: Secondary | ICD-10-CM

## 2022-09-17 DIAGNOSIS — I959 Hypotension, unspecified: Secondary | ICD-10-CM

## 2022-09-17 DIAGNOSIS — R413 Other amnesia: Secondary | ICD-10-CM

## 2022-09-17 DIAGNOSIS — K529 Noninfective gastroenteritis and colitis, unspecified: Secondary | ICD-10-CM | POA: Diagnosis not present

## 2022-09-17 LAB — BASIC METABOLIC PANEL
BUN: 23 mg/dL (ref 6–23)
CO2: 23 mEq/L (ref 19–32)
Calcium: 9.8 mg/dL (ref 8.4–10.5)
Chloride: 108 mEq/L (ref 96–112)
Creatinine, Ser: 1.57 mg/dL — ABNORMAL HIGH (ref 0.40–1.50)
GFR: 42.86 mL/min — ABNORMAL LOW (ref >60.00)
Glucose, Bld: 94 mg/dL (ref 70–99)
Potassium: 4.5 mEq/L (ref 3.5–5.1)
Sodium: 138 mEq/L (ref 135–145)

## 2022-09-17 LAB — CBC WITH DIFFERENTIAL/PLATELET
Basophils Absolute: 0 10*3/uL (ref 0.0–0.1)
Basophils Relative: 0.7 % (ref 0.0–3.0)
Eosinophils Absolute: 0.1 10*3/uL (ref 0.0–0.7)
Eosinophils Relative: 1.5 % (ref 0.0–5.0)
HCT: 32.1 % — ABNORMAL LOW (ref 39.0–52.0)
Hemoglobin: 10.3 g/dL — ABNORMAL LOW (ref 13.0–17.0)
Lymphocytes Relative: 31.3 % (ref 12.0–46.0)
Lymphs Abs: 1.5 10*3/uL (ref 0.7–4.0)
MCHC: 32 g/dL (ref 30.0–36.0)
MCV: 85 fl (ref 78.0–100.0)
Monocytes Absolute: 0.4 10*3/uL (ref 0.1–1.0)
Monocytes Relative: 7.7 % (ref 3.0–12.0)
Neutro Abs: 2.8 10*3/uL (ref 1.4–7.7)
Neutrophils Relative %: 58.8 % (ref 43.0–77.0)
Platelets: 368 10*3/uL (ref 150.0–400.0)
RBC: 3.77 Mil/uL — ABNORMAL LOW (ref 4.22–5.81)
RDW: 17.2 % — ABNORMAL HIGH (ref 11.5–15.5)
WBC: 4.8 10*3/uL (ref 4.0–10.5)

## 2022-09-17 LAB — FOLATE: Folate: >23.9 ng/mL (ref >5.9)

## 2022-09-17 LAB — VITAMIN B12: Vitamin B-12: 545 pg/mL (ref 211–911)

## 2022-09-17 NOTE — Progress Notes (Signed)
Established Patient Office Visit   Subjective  Patient ID: Luis Villegas, male    DOB: [birth year]  Age: 77 y.o. MRN: [id number]  Chief Complaint  Patient presents with   Hospitalization Follow-up  Pt accompanied by his wife  Pt is a 77 yo male seen for ED f/u.  Pt last seen in clinic 1/29 for memory changes and several falls.  Pt seen in ED 09/05/22 dx'd with influenza.  Given fluids as dehydrated.  CT head with chronic small vessel changes.  Hgb was 9.1.  Pt with continued memory change and  still having shuffled walk.  Had another fall at home.  Does not recall the events surrounding the falls.    Endorses nocturia.  Up at least 4x per night to urinate.  Drinking 1 cup of coffee in am.  Seen by urology in the past.  Provider questions what  meds pt is taking as vesicare, myrbetriq and flomax seen on chart review.      Pt with chronic loose stools.      ROS Negative unless stated above    Objective:     BP (!) 92/50   Pulse 64   Temp 98.7 F (37.1 C) (Oral)   Ht '5\' 8"'$  (1.727 m)   Wt 165 lb (74.8 kg)   SpO2 97%   BMI 25.09 kg/m     Orthostatic vs:  laying down- BP 120/60, P 76   sitting-100/60, P67  standing-90/60 P73   Physical Exam Constitutional:      General: He is not in acute distress.    Appearance: Normal appearance.  HENT:     Head: Normocephalic and atraumatic.     Nose: Nose normal.     Mouth/Throat:     Mouth: Mucous membranes are moist.  Cardiovascular:     Rate and Rhythm: Normal rate and regular rhythm.     Heart sounds: Normal heart sounds. No murmur heard.    No gallop.  Pulmonary:     Effort: Pulmonary effort is normal. No respiratory distress.     Breath sounds: Normal breath sounds. No wheezing, rhonchi or rales.  Skin:    General: Skin is warm and dry.  Neurological:     Mental Status: He is alert and oriented to person, place, and time.      09/01/2022    2:00 PM 05/05/2022    9:47 AM 12/26/2021   10:38 AM  Depression  screen PHQ 2/9  Decreased Interest 2 0 1  Down, Depressed, Hopeless 0 0 0  PHQ - 2 Score 2 0 1  Altered sleeping 1  0  Tired, decreased energy 1  1  Change in appetite 1  1  Feeling bad or failure about yourself  0  0  Trouble concentrating 0  0  Moving slowly or fidgety/restless 1  0  Suicidal thoughts 0  0  PHQ-9 Score 6  3  Difficult doing work/chores Not difficult at all  Somewhat difficult      09/01/2022    2:00 PM 12/23/2017    9:09 AM  GAD 7 : Generalized Anxiety Score  Nervous, Anxious, on Edge 0 0  Control/stop worrying 0 0  Worry too much - different things 0 0  Trouble relaxing 0 0  Restless 0 0  Easily annoyed or irritable 0 0  Afraid - awful might happen 0 0  Total GAD 7 Score 0 0  Anxiety Difficulty Not difficult at all  No results found for any visits on 09/17/22.    Assessment & Plan:  Memory change -discussed concern for dementia (vascular or alzheimer's).  Vitamin B12 deficiency may be contributing.  Also consider Parkinson's dz given change in gait. -CT head previously ordered, but done in ED during recent visit.  CT 09/05/22: atrophy with chronic microvascular ischemic changes.  No acute intracranial process. -MOCA done this visit, score 21/30.  Education level 13.5+ yrs.  Difficulty with attention-repeating numbers in forward order, list of letters, and 3 correct subtractions on serial 7s.  Difficulty with language-missed second sentences and fluency (<11 words) able to name 8, and difficulty with delayed recall-unable to recall any of the words  -referrals for neuropsych testing and neuro placed for further eval -     CBC with Differential/Platelet -     Iron, TIBC and Ferritin Panel -     Basic metabolic panel -     Ambulatory referral to Neurology -     Ambulatory referral to Neuropsychology -     Folate  Nocturia -likely 2/ [facility] -pt's wife will provide updated med list -advised on possible S/E of taking myrbetriq and vesicare -continue  f/u with Urology  Anemia, unspecified type -hgb 9.5 on 09/04/22 -repeat cbc and iron -increase intake in iron rich foods/start iron supplement.  Given precautions regarding iron. -     CBC with Differential/Platelet -     Iron, TIBC and Ferritin Panel  Abnormal gait -new stiff/shuffled gait -discussed possible causes such as arthritis in spine, Parkinson's dz, etc. -     Ambulatory referral to Neurology  Orthostatic Hypotension -orthostatic bps laying down 120/60 P 76, sitting 100/60  P 67, and standing 90/60 P 73 -increase po intake of fluids -change positions slowly and with caution. -medication reconciliation  Vitamin B12 deficiency -vitamin b12 level 148 on 09/01/22 -continue supplementation -     Vitamin B12  Chronic diarrhea -supportive care -food diary, probiotic, imodium prn -f/u with GI   Return in about 4 weeks (around 10/15/2022), or if symptoms worsen or fail to improve.   Billie Ruddy, MD

## 2022-09-17 NOTE — Patient Instructions (Addendum)
Myrbetriq, vesicare, and Flomax were the medications from the urologist that help with frequent urination.    Information review note regarding Parkinson's disease and vascular dementia so that he will have an idea of the types of symptoms that can be seen in both illnesses.  Referral to the neurologist and for neuropsych testing above placed.  You should expect a phone call about scheduling these appointments.  Orders for repeat labs were also placed.

## 2022-09-18 LAB — IRON,TIBC AND FERRITIN PANEL
%SAT: 19 % (calc) — ABNORMAL LOW (ref 20–48)
Ferritin: 11 ng/mL — ABNORMAL LOW (ref 24–380)
Iron: 79 ug/dL (ref 50–180)
TIBC: 427 mcg/dL (calc) — ABNORMAL HIGH (ref 250–425)

## 2022-09-22 ENCOUNTER — Encounter: Payer: Self-pay | Admitting: Family Medicine

## 2022-09-23 ENCOUNTER — Ambulatory Visit (INDEPENDENT_AMBULATORY_CARE_PROVIDER_SITE_OTHER): Payer: Medicare Other | Admitting: *Deleted

## 2022-09-23 DIAGNOSIS — E538 Deficiency of other specified B group vitamins: Secondary | ICD-10-CM | POA: Diagnosis not present

## 2022-09-23 MED ORDER — CYANOCOBALAMIN 1000 MCG/ML IJ SOLN
1000.0000 ug | Freq: Once | INTRAMUSCULAR | Status: AC
  Administered 2022-09-23: 1000 ug via INTRAMUSCULAR

## 2022-09-23 NOTE — Progress Notes (Signed)
Per orders of Dr. Martinique, injection of Cyanocobalamin 1071mcg given by Agnes Lawrence. Patient tolerated injection well.

## 2022-09-28 ENCOUNTER — Other Ambulatory Visit: Payer: Self-pay | Admitting: Family Medicine

## 2022-09-30 ENCOUNTER — Other Ambulatory Visit: Payer: Medicare Other

## 2022-10-01 NOTE — Progress Notes (Incomplete)
   Established Patient Office Visit   Subjective  Patient ID: Luis Villegas, male    DOB: 08/25/1945  Age: 77 y.o. MRN: [id number]  Chief Complaint  Patient presents with  . Hospitalization Follow-up  Pt accompanied by his wife  Pt is a 77 yo male seen for ED f/u.  Pt last seen in clinic 1/29 for memory changes and several falls.  Pt seen in ED 09/05/22 dx'd with influenza.  Given fluids as dehydrated.  CT head with chronic small vessel changes.  Hgb was 9.1.  Pt with continued memory change and  still having shuffled walk.  Had another fall at home.  Does not recall the events surrounding the falls.    Endorses nocturia.  Up at least 4x per night to urinate.  Drinking 1 cup of coffee in am.  Seen by urology in the past.  Provider questions what  meds pt is taking as vesicare, myrbetriq and flomax seen on chart review.      Pt with chronic loose stools.    {History (Optional):23778}  ROS Negative unless stated above    Objective:     BP (!) 92/50   Pulse 64   Temp 98.7 F (37.1 C) (Oral)   Ht 5' 8"$  (1.727 m)   Wt 165 lb (74.8 kg)   SpO2 97%   BMI 25.09 kg/m  {Vitals History (Optional):23777}  Physical Exam Constitutional:      General: He is not in acute distress.    Appearance: Normal appearance.  HENT:     Head: Normocephalic and atraumatic.     Nose: Nose normal.     Mouth/Throat:     Mouth: Mucous membranes are moist.  Cardiovascular:     Rate and Rhythm: Normal rate and regular rhythm.     Heart sounds: Normal heart sounds. No murmur heard.    No gallop.  Pulmonary:     Effort: Pulmonary effort is normal. No respiratory distress.     Breath sounds: Normal breath sounds. No wheezing, rhonchi or rales.  Skin:    General: Skin is warm and dry.  Neurological:     Mental Status: He is alert and oriented to person, place, and time.     No results found for any visits on 09/17/22.    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  Given precautions regarding iron. -     CBC with Differential/Platelet -     Iron, TIBC and Ferritin Panel  Abnormal gait -     Ambulatory referral to Neurology  Orthostatic Hypotension -orthostatic bps laying down 120/60 P 76, sitting 100/60  P 67, and standing 90/60 P 73 -increase po intake of fluids -change positions slowly and with caution. -medication reconcilliation  Vitamin B12 deficiency -     Vitamin B12  Chronic diarrhea -supportive care -food diary, probiotic, imodium prn -f/u with GI   Return in about 4 weeks (around 10/15/2022), or if symptoms worsen or fail to improve.   Billie Ruddy, MD

## 2022-10-02 ENCOUNTER — Other Ambulatory Visit: Payer: Self-pay | Admitting: Family Medicine

## 2022-10-02 ENCOUNTER — Encounter: Payer: Self-pay | Admitting: Family Medicine

## 2022-10-15 ENCOUNTER — Ambulatory Visit (INDEPENDENT_AMBULATORY_CARE_PROVIDER_SITE_OTHER): Payer: Medicare Other | Admitting: Family Medicine

## 2022-10-15 ENCOUNTER — Encounter: Payer: Self-pay | Admitting: Family Medicine

## 2022-10-15 VITALS — BP 110/62 | HR 65 | Temp 98.1°F | Wt 161.2 lb

## 2022-10-15 DIAGNOSIS — D649 Anemia, unspecified: Secondary | ICD-10-CM | POA: Diagnosis not present

## 2022-10-15 DIAGNOSIS — E538 Deficiency of other specified B group vitamins: Secondary | ICD-10-CM

## 2022-10-15 DIAGNOSIS — K529 Noninfective gastroenteritis and colitis, unspecified: Secondary | ICD-10-CM

## 2022-10-15 MED ORDER — CYANOCOBALAMIN 1000 MCG/ML IJ SOLN
1000.0000 ug | Freq: Once | INTRAMUSCULAR | Status: AC
  Administered 2022-10-15: 1000 ug via INTRAMUSCULAR

## 2022-10-15 NOTE — Progress Notes (Signed)
Established Patient Office Visit   Subjective  Patient ID: Luis Villegas, male    DOB: 02/17/1946  Age: 77 y.o. MRN: [id number]  Chief Complaint  Patient presents with  . Medical Management of Chronic Issues    Patient is a 77 year old male who presents for acute concern.  Patient endorses h/o diarrhea.   Using Imodium 3-4 x/d.   Patient Active Problem List   Diagnosis Date Noted  . Chronic diarrhea 06/11/2022  . Chronic left shoulder pain 10/15/2020  . 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 Chronic kidney disease (CKD), stage III (moderate) 04/15/2019  . VENTRAL HERNIA 11/23/2008  . COLONIC POLYPS, HX OF 11/23/2008  . HYPERLIPIDEMIA 11/11/2007  . CARPAL TUNNEL SYNDROME 11/11/2007  . GERD 11/11/2007  . Sleep apnea 11/11/2007  . PROSTATE CANCER, HX OF 11/11/2007  . ANXIETY STATE NOS 05/18/2007  . MIGRAINE NEC W/INTRACTABLE MIGRAINE 03/23/2007  . ANEMIA-NOS 09/23/2006   Past Surgical History:  Procedure Laterality Date  . CARPAL TUNNEL RELEASE Bilateral   . CERVICAL FUSION    . COLONOSCOPY  2007, 08/09/2014   2007 [facility]/ 2016 [place]  . COLONOSCOPY W/ POLYPECTOMY  2002  . POLYPECTOMY  2016   polyps x3 T.A.  . PROSTATECTOMY  2003   Dr.Davis  . TONSILLECTOMY     Social History   Tobacco Use  . Smoking status: Former    Types: Cigarettes    Quit date: 08/04/1993    Years since quitting: 29.3  . Smokeless tobacco: Never  Vaping Use  . Vaping Use: Never used  Substance Use Topics  . Alcohol use: No  . Drug use: No   Family History  Problem Relation Age of Onset  . Stroke Father 54  . Bladder Cancer Father   . Breast cancer Mother   . Diabetes Mother   . Neuropathy Mother   . Valvular heart disease Mother   . Heart attack Mother 30  . Prostate cancer Brother   . Heart disease Brother   . Heart attack Brother   . Prostate cancer Brother   . Colon cancer Neg Hx   . Colon polyps Neg Hx   . Esophageal cancer Neg Hx   . Stomach cancer Neg Hx   .  Rectal cancer Neg Hx    Allergies  Allergen Reactions  . Lipitor [Atorvastatin Calcium]     myalgias      ROS Negative unless stated above    Objective:     BP 110/62 (BP Location: Left Arm, Patient Position: Sitting, Cuff Size: Normal)   Pulse 65   Temp 98.1 F (36.7 C) (Oral)   Wt 161 lb 3.2 oz (73.1 kg)   SpO2 98%   BMI 24.51 kg/m  {Vitals History (Optional):23777}  Physical Exam Constitutional:      General: He is not in acute distress.    Appearance: Normal appearance.  HENT:     Head: Normocephalic and atraumatic.     Nose: Nose normal.     Mouth/Throat:     Mouth: Mucous membranes are moist.  Eyes:     Extraocular Movements: Extraocular movements intact.     Conjunctiva/sclera: Conjunctivae normal.     Pupils: Pupils are equal, round, and reactive to light.  Cardiovascular:     Rate and Rhythm: Normal rate and regular rhythm.     Heart sounds: Normal heart sounds. No murmur heard.    No gallop.  Pulmonary:     Effort: Pulmonary effort is normal. No respiratory distress.  Breath sounds: Normal breath sounds. No wheezing, rhonchi or rales.  Abdominal:     General: Bowel sounds are normal. There is no distension.     Palpations: Abdomen is soft.     Tenderness: There is no abdominal tenderness.  Skin:    General: Skin is warm and dry.  Neurological:     Mental Status: He is alert and oriented to person, place, and time.     No results found for any visits on 10/15/22.    Assessment & Plan:  B12 deficiency -Vitamin B12 148 on 09/01/2022, 545 on 09/17/2022. 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  Also consider SIBO -decrease intake of fiber (fruits and vegetables) as well as diary which can make symptoms worse -Continue imodium  prn. -keep f/u with GI -     CBC with Differential/Platelet; Future -     Vitamin B12; Future  Anemia, unspecified type -     CBC with Differential/Platelet; Future -     Iron, TIBC and Ferritin Panel; Future -     Vitamin B12; Future   Return in about 5 weeks (around 11/19/2022), or if symptoms worsen or fail to improve.   Deeann Saint, MD

## 2022-10-15 NOTE — Patient Instructions (Addendum)
Checking messages on your phone as it seems that the neurology office tried to contact you twice in regards to scheduling an appointment.  Try calling the office back, [facility]  [phone].  You can take over-the-counter vitamin B12 1000-2000 IUs daily to help improve your vitamin B12 level.  We can recheck this level as well as a CBC to check the status of your anemia in the next few weeks.  Write down foods you are eating so you can take them with you to your GI appointment.

## 2022-11-11 ENCOUNTER — Ambulatory Visit: Payer: Medicare Other | Admitting: Gastroenterology

## 2022-11-23 ENCOUNTER — Encounter: Payer: Self-pay | Admitting: Family Medicine

## 2022-12-03 ENCOUNTER — Other Ambulatory Visit: Payer: Medicare Other

## 2022-12-03 ENCOUNTER — Ambulatory Visit: Payer: Medicare Other | Admitting: Dermatology

## 2022-12-04 ENCOUNTER — Encounter: Payer: Self-pay | Admitting: Gastroenterology

## 2022-12-04 ENCOUNTER — Other Ambulatory Visit (INDEPENDENT_AMBULATORY_CARE_PROVIDER_SITE_OTHER): Payer: Medicare Other

## 2022-12-04 DIAGNOSIS — E538 Deficiency of other specified B group vitamins: Secondary | ICD-10-CM | POA: Diagnosis not present

## 2022-12-04 DIAGNOSIS — D649 Anemia, unspecified: Secondary | ICD-10-CM

## 2022-12-04 DIAGNOSIS — K529 Noninfective gastroenteritis and colitis, unspecified: Secondary | ICD-10-CM | POA: Diagnosis not present

## 2022-12-04 LAB — CBC WITH DIFFERENTIAL/PLATELET
Basophils Absolute: 0 10*3/uL (ref 0.0–0.1)
Basophils Relative: 0.9 % (ref 0.0–3.0)
Eosinophils Absolute: 0.1 10*3/uL (ref 0.0–0.7)
Eosinophils Relative: 1.9 % (ref 0.0–5.0)
HCT: 35.8 % — ABNORMAL LOW (ref 39.0–52.0)
Hemoglobin: 11.8 g/dL — ABNORMAL LOW (ref 13.0–17.0)
Lymphocytes Relative: 31.6 % (ref 12.0–46.0)
Lymphs Abs: 1.4 10*3/uL (ref 0.7–4.0)
MCHC: 32.9 g/dL (ref 30.0–36.0)
MCV: 91.1 fl (ref 78.0–100.0)
Monocytes Absolute: 0.4 10*3/uL (ref 0.1–1.0)
Monocytes Relative: 9.5 % (ref 3.0–12.0)
Neutro Abs: 2.4 10*3/uL (ref 1.4–7.7)
Neutrophils Relative %: 56.1 % (ref 43.0–77.0)
Platelets: 218 10*3/uL (ref 150.0–400.0)
RBC: 3.93 Mil/uL — ABNORMAL LOW (ref 4.22–5.81)
RDW: 19.1 % — ABNORMAL HIGH (ref 11.5–15.5)
WBC: 4.3 10*3/uL (ref 4.0–10.5)

## 2022-12-04 LAB — VITAMIN B12: Vitamin B-12: 530 pg/mL (ref 211–911)

## 2022-12-05 LAB — IRON,TIBC AND FERRITIN PANEL
%SAT: 19 % (calc) — ABNORMAL LOW (ref 20–48)
Ferritin: 8 ng/mL — ABNORMAL LOW (ref 24–380)
Iron: 73 ug/dL (ref 50–180)
TIBC: 390 mcg/dL (calc) (ref 250–425)

## 2022-12-08 ENCOUNTER — Ambulatory Visit (INDEPENDENT_AMBULATORY_CARE_PROVIDER_SITE_OTHER): Payer: Medicare Other | Admitting: Neurology

## 2022-12-08 ENCOUNTER — Encounter: Payer: Self-pay | Admitting: Neurology

## 2022-12-08 VITALS — BP 128/66 | HR 74 | Ht 68.0 in | Wt 161.2 lb

## 2022-12-08 DIAGNOSIS — R413 Other amnesia: Secondary | ICD-10-CM

## 2022-12-08 DIAGNOSIS — D509 Iron deficiency anemia, unspecified: Secondary | ICD-10-CM | POA: Diagnosis not present

## 2022-12-08 NOTE — Progress Notes (Signed)
Chief Complaint  Patient presents with   New Patient (Initial Visit)    Rm 14 ALONE MOCA 18 Memory loss      ASSESSMENT AND PLAN  Luis Villegas is a 77 y.o. male   Cognitive impairment  MoCA examination 19/30 today  CT of the brain showed generalized atrophy periventricular small vessel disease,  Laboratory evaluation showed anemia, B12 deficiency, improved with supplement, pending GI workup,  His complaints of memory loss can be multifactorial, aging, underlying central nervous system degenerative disorder, medical condition, B12 deficiency, blood loss can all contributed to,  Has finished a basic evaluation for cognitive impairment, encouraged him to focus on GI workup for anemia for right now,  Return to clinic in 6 months, if he continues to have memory loss, may consider MRI of the brain  Encourage moderate exercise  DIAGNOSTIC DATA (LABS, IMAGING, TESTING) - I reviewed patient records, labs, notes, testing and imaging myself where available.   MEDICAL HISTORY:  Luis Villegas, is a 77 year old male, seen in request by his primary care physician Dr. Abbe Amsterdam for evaluation of memory loss, initial evaluation was on Dec 08, 2022    I reviewed and summarized the referring note. PMHX. Chronic migraine, GERD Depression Prostate cancer, Cervical fusion,  Obstructive sleep apnea, could not tolerate the machine  He denies a family history of memory loss, is a retired Production designer, theatre/television/film for [employer], retired since 2008, used to enjoy and walk, about 4 miles a day, but quit exercising regularly since 2018,  Over the past few years, he noticed mild memory loss, no major limitation in his daily activity, still able to remember his family members birthday, sedentary lifestyle,  No family history of dementia,  ER presentation on September 05, 2022, he was confused, had a blank stare, weak, fell down, found to be influenza positive  Personally  reviewed CT head, no acute abnormality, atrophy with chronic small vessel disease  Labs in May 2024,  hg 11.9, was 9.5 in Feb 9.5, ferritin 8,  B12 530, was 148 in Feb, normal TSH.  He has received the B12, iron supplement, repeat lab has improved, so was his strength, pending colonoscopy workup,  PHYSICAL EXAM:   Vitals:   12/08/22 1453  BP: 128/66  Pulse: 74  Weight: 161 lb 2.5 oz (73.1 kg)  Height: 5\' 8"  (1.727 m)    Body mass index is 24.5 kg/m.  PHYSICAL EXAMNIATION:  Gen: NAD, conversant, well nourised, well groomed                     Cardiovascular: Regular rate rhythm, no peripheral edema, warm, nontender. Eyes: Conjunctivae clear without exudates or hemorrhage Neck: Supple, no carotid bruits. Pulmonary: Clear to auscultation bilaterally   NEUROLOGICAL EXAM:  MENTAL STATUS: Speech/cognition: Awake, alert, oriented to history taking and casual conversation    12/08/2022    2:55 PM  Montreal Cognitive Assessment   Visuospatial/ Executive (0/5) 4  Naming (0/3) 3  Attention: Read list of digits (0/2) 1  Attention: Read list of letters (0/1) 1  Attention: Serial 7 subtraction starting at 100 (0/3) 3  Language: Repeat phrase (0/2) 1  Language : Fluency (0/1) 0  Abstraction (0/2) 1  Delayed Recall (0/5) 0  Orientation (0/6) 5  Total 19    CRANIAL NERVES: CN II: Visual fields are full to confrontation. Pupils are round equal and briskly reactive to light. CN III, IV, VI: extraocular movement are normal. No ptosis. CN V:  Facial sensation is intact to light touch CN VII: Face is symmetric with normal eye closure  CN VIII: Hearing is normal to causal conversation. CN IX, X: Phonation is normal. CN XI: Head turning and shoulder shrug are intact  MOTOR: There is no pronator drift of out-stretched arms. Muscle bulk and tone are normal. Muscle strength is normal.  REFLEXES: Reflexes are 2+ and symmetric at the biceps, triceps, knees, and ankles. Plantar responses  are flexor.  SENSORY: Intact to light touch, pinprick and vibratory sensation are intact in fingers and toes.  COORDINATION: There is no trunk or limb dysmetria noted.  GAIT/STANCE: Posture is normal. Gait is steady with normal steps, base, arm swing, and turning. Heel and toe walking are normal. Tandem gait is normal.  Romberg is absent.  REVIEW OF SYSTEMS:  Full 14 system review of systems performed and notable only for as above All other review of systems were negative.   ALLERGIES: No Active Allergies  HOME MEDICATIONS: Current Outpatient Medications  Medication Sig Dispense Refill   acetaminophen-codeine (TYLENOL #3) 300-30 MG tablet Take 1 tablet by mouth every 6 (six) hours as needed for moderate pain. 30 tablet 0   acyclovir (ZOVIRAX) 200 MG capsule TAKE 1 CAPSULE(200 MG) BY MOUTH DAILY 90 capsule 1   azelastine (OPTIVAR) 0.05 % ophthalmic solution Apply 1 drop to eye 2 (two) times daily.     buPROPion (WELLBUTRIN XL) 150 MG 24 hr tablet TAKE 1 TABLET(150 MG) BY MOUTH DAILY 90 tablet 1   citalopram (CELEXA) 20 MG tablet TAKE 1 TABLET(20 MG) BY MOUTH DAILY 90 tablet 1   loperamide (IMODIUM A-D) 2 MG tablet Use a 1/2 tablet every morning and titrate as needed. 30 tablet 0   Multiple Vitamin (MULTIVITAMIN ADULT PO) Take by mouth.     MYRBETRIQ 25 MG TB24 tablet Take 25 mg by mouth daily.     omeprazole (PRILOSEC) 20 MG capsule TAKE 1 CAPSULE BY MOUTH EVERY DAY 90 capsule 1   SUMAtriptan (IMITREX) 100 MG tablet TAKE 1 TABLET BY MOUTH AS NEEDED FOR HEADACHE 9 tablet 6   tamsulosin (FLOMAX) 0.4 MG CAPS capsule Take 0.4 mg by mouth daily.     topiramate (TOPAMAX) 200 MG tablet TAKE 1 TABLET(200 MG) BY MOUTH AT BEDTIME 90 tablet 1   No current facility-administered medications for this visit.    PAST MEDICAL HISTORY: Past Medical History:  Diagnosis Date   Allergy    Colon polyps 2016   Depression    GERD (gastroesophageal reflux disease)    Heart murmur     Hyperlipidemia    IBS (irritable bowel syndrome)    Kidney stones    stage 3    Memory loss    Prostate cancer (HCC) 2003   sx 2003 and radation   Sleep apnea    no cpap per pt   Sleep apnea with use of continuous positive airway pressure (CPAP)     PAST SURGICAL HISTORY: Past Surgical History:  Procedure Laterality Date   CARPAL TUNNEL RELEASE Bilateral    CERVICAL FUSION     COLONOSCOPY  2007, 08/09/2014   2007 [facility]/ 2016 [place],   COLONOSCOPY W/ POLYPECTOMY  2002   POLYPECTOMY  2016   polyps x3 T.A.   PROSTATECTOMY  2003   Dr.Davis   TONSILLECTOMY      FAMILY HISTORY: Family History  Problem Relation Age of Onset   Stroke Father 38   Bladder Cancer Father    Breast cancer Mother  Diabetes Mother    Neuropathy Mother    Valvular heart disease Mother    Heart attack Mother 50   Prostate cancer Brother    Heart disease Brother    Heart attack Brother    Prostate cancer Brother    Colon cancer Neg Hx    Colon polyps Neg Hx    Esophageal cancer Neg Hx    Stomach cancer Neg Hx    Rectal cancer Neg Hx     SOCIAL HISTORY: Social History   Socioeconomic History   Marital status: Married    Spouse name: Not on file   Number of children: 1   Years of education: Not on file   Highest education level: 12th grade  Occupational History   Occupation: Retired  Tobacco Use   Smoking status: Former    Types: Cigarettes    Quit date: 08/04/1993    Years since quitting: 29.3   Smokeless tobacco: Never  Vaping Use   Vaping Use: Never used  Substance and Sexual Activity   Alcohol use: No   Drug use: No   Sexual activity: Yes  Other Topics Concern   Not on file  Social History Narrative   Not on file   Social Determinants of Health   Financial Resource Strain: Low Risk  (09/01/2022)   Overall Financial Resource Strain (CARDIA)    Difficulty of Paying Living Expenses: Not hard at all  Food Insecurity: No Food Insecurity (09/01/2022)   Hunger Vital Sign     Worried About Running Out of Food in the Last Year: Never true    Ran Out of Food in the Last Year: Never true  Transportation Needs: No Transportation Needs (09/01/2022)   PRAPARE - Administrator, Civil Service (Medical): No    Lack of Transportation (Non-Medical): No  Physical Activity: Insufficiently Active (09/01/2022)   Exercise Vital Sign    Days of Exercise per Week: 2 days    Minutes of Exercise per Session: 10 min  Stress: No Stress Concern Present (09/01/2022)   [employer] of Occupational Health - Occupational Stress Questionnaire    Feeling of Stress : Only a little  Social Connections: Moderately Integrated (09/01/2022)   Social Connection and Isolation Panel [NHANES]    Frequency of Communication with Friends and Family: Once a week    Frequency of Social Gatherings with Friends and Family: Once a week    Attends Religious Services: More than 4 times per year    Active Member of [employer] or Organizations: Yes    Attends Banker Meetings: Not on file    Marital Status: Married  Intimate Partner Violence: Not At Risk (05/03/2021)   Humiliation, Afraid, Rape, and Kick questionnaire    Fear of Current or Ex-Partner: No    Emotionally Abused: No    Physically Abused: No    Sexually Abused: No      Levert Feinstein, M.D. Ph.D.  [facility] [address], [address] [place], [place] [address] Ph: [phone] Fax: [phone]  CC:  Deeann Saint, MD [address] [place],  [place] [address]  Deeann Saint, MD

## 2022-12-15 ENCOUNTER — Ambulatory Visit: Payer: Medicare Other | Admitting: Neurology

## 2022-12-27 ENCOUNTER — Other Ambulatory Visit: Payer: Self-pay | Admitting: Family Medicine

## 2023-01-02 ENCOUNTER — Other Ambulatory Visit: Payer: Self-pay | Admitting: Family Medicine

## 2023-01-26 DIAGNOSIS — C61 Malignant neoplasm of prostate: Secondary | ICD-10-CM | POA: Diagnosis not present

## 2023-02-02 DIAGNOSIS — C61 Malignant neoplasm of prostate: Secondary | ICD-10-CM | POA: Diagnosis not present

## 2023-02-02 DIAGNOSIS — N451 Epididymitis: Secondary | ICD-10-CM | POA: Diagnosis not present

## 2023-02-02 DIAGNOSIS — N200 Calculus of kidney: Secondary | ICD-10-CM | POA: Diagnosis not present

## 2023-02-02 DIAGNOSIS — N3941 Urge incontinence: Secondary | ICD-10-CM | POA: Diagnosis not present

## 2023-03-31 DIAGNOSIS — C44319 Basal cell carcinoma of skin of other parts of face: Secondary | ICD-10-CM | POA: Diagnosis not present

## 2023-03-31 DIAGNOSIS — L570 Actinic keratosis: Secondary | ICD-10-CM | POA: Diagnosis not present

## 2023-03-31 DIAGNOSIS — C44519 Basal cell carcinoma of skin of other part of trunk: Secondary | ICD-10-CM | POA: Diagnosis not present

## 2023-03-31 DIAGNOSIS — L821 Other seborrheic keratosis: Secondary | ICD-10-CM | POA: Diagnosis not present

## 2023-03-31 DIAGNOSIS — D485 Neoplasm of uncertain behavior of skin: Secondary | ICD-10-CM | POA: Diagnosis not present

## 2023-03-31 DIAGNOSIS — D1801 Hemangioma of skin and subcutaneous tissue: Secondary | ICD-10-CM | POA: Diagnosis not present

## 2023-03-31 DIAGNOSIS — Z85828 Personal history of other malignant neoplasm of skin: Secondary | ICD-10-CM | POA: Diagnosis not present

## 2023-03-31 DIAGNOSIS — D225 Melanocytic nevi of trunk: Secondary | ICD-10-CM | POA: Diagnosis not present

## 2023-03-31 DIAGNOSIS — L814 Other melanin hyperpigmentation: Secondary | ICD-10-CM | POA: Diagnosis not present

## 2023-04-17 ENCOUNTER — Ambulatory Visit (INDEPENDENT_AMBULATORY_CARE_PROVIDER_SITE_OTHER): Payer: Medicare Other

## 2023-04-17 DIAGNOSIS — Z23 Encounter for immunization: Secondary | ICD-10-CM | POA: Diagnosis not present

## 2023-04-22 ENCOUNTER — Ambulatory Visit: Payer: Medicare Other | Admitting: Orthopedic Surgery

## 2023-04-29 ENCOUNTER — Ambulatory Visit: Payer: Medicare Other | Admitting: Orthopedic Surgery

## 2023-04-30 DIAGNOSIS — L570 Actinic keratosis: Secondary | ICD-10-CM | POA: Diagnosis not present

## 2023-04-30 DIAGNOSIS — D225 Melanocytic nevi of trunk: Secondary | ICD-10-CM | POA: Diagnosis not present

## 2023-04-30 DIAGNOSIS — Z85828 Personal history of other malignant neoplasm of skin: Secondary | ICD-10-CM | POA: Diagnosis not present

## 2023-04-30 DIAGNOSIS — D485 Neoplasm of uncertain behavior of skin: Secondary | ICD-10-CM | POA: Diagnosis not present

## 2023-06-01 ENCOUNTER — Ambulatory Visit (INDEPENDENT_AMBULATORY_CARE_PROVIDER_SITE_OTHER): Payer: Medicare Other

## 2023-06-01 VITALS — Ht 68.0 in | Wt 161.0 lb

## 2023-06-01 DIAGNOSIS — Z0000 Encounter for general adult medical examination without abnormal findings: Secondary | ICD-10-CM

## 2023-06-01 DIAGNOSIS — Z1211 Encounter for screening for malignant neoplasm of colon: Secondary | ICD-10-CM | POA: Diagnosis not present

## 2023-06-01 NOTE — Progress Notes (Signed)
Subjective:   Luis Villegas is a 77 y.o. male who presents for Medicare Annual/Subsequent preventive examination.  Visit Complete: Virtual I connected with  Luis Villegas on 06/01/23 by a audio enabled telemedicine application and verified that I am speaking with the correct person using two identifiers.  Patient Location: Home  Provider Location: Home Office  I discussed the limitations of evaluation and management by telemedicine. The patient expressed understanding and agreed to proceed.  Vital Signs: Because this visit was a virtual/telehealth visit, some criteria may be missing or patient reported. Any vitals not documented were not able to be obtained and vitals that have been documented are patient reported.  Patient Medicare AWV questionnaire was completed by the patient on 05/28/23; I have confirmed that all information answered by patient is correct and no changes since this date.  Cardiac Risk Factors include: advanced age (>62men, >71 women);male gender     Objective:    Today's Vitals   06/01/23 1523  Weight: 161 lb (73 kg)  Height: 5\' 8"  (1.727 m)   Body mass index is 24.48 kg/m.     06/01/2023    3:31 PM 09/04/2022    9:09 PM 05/05/2022    9:46 AM 05/03/2021    8:23 AM 04/18/2020    9:47 AM  Advanced Directives  Does Patient Have a Medical Advance Directive? No No No No No  Would patient like information on creating a medical advance directive? No - Patient declined No - Patient declined  No - Patient declined No - Patient declined    Current Medications (verified) Outpatient Encounter Medications as of 06/01/2023  Medication Sig   acetaminophen-codeine (TYLENOL #3) 300-30 MG tablet Take 1 tablet by mouth every 6 (six) hours as needed for moderate pain.   acyclovir (ZOVIRAX) 200 MG capsule TAKE 1 CAPSULE(200 MG) BY MOUTH DAILY   azelastine (OPTIVAR) 0.05 % ophthalmic solution Apply 1 drop to eye 2 (two) times daily.   buPROPion  (WELLBUTRIN XL) 150 MG 24 hr tablet TAKE 1 TABLET(150 MG) BY MOUTH DAILY   citalopram (CELEXA) 20 MG tablet TAKE 1 TABLET(20 MG) BY MOUTH DAILY   loperamide (IMODIUM A-D) 2 MG tablet Use a 1/2 tablet every morning and titrate as needed.   Multiple Vitamin (MULTIVITAMIN ADULT PO) Take by mouth.   MYRBETRIQ 25 MG TB24 tablet Take 25 mg by mouth daily.   omeprazole (PRILOSEC) 20 MG capsule TAKE 1 CAPSULE BY MOUTH EVERY DAY   tamsulosin (FLOMAX) 0.4 MG CAPS capsule Take 0.4 mg by mouth daily.   topiramate (TOPAMAX) 200 MG tablet TAKE 1 TABLET(200 MG) BY MOUTH AT BEDTIME   No facility-administered encounter medications on file as of 06/01/2023.    Allergies (verified) Patient has no active allergies.   History: Past Medical History:  Diagnosis Date   Allergy    Colon polyps 2016   Depression    GERD (gastroesophageal reflux disease)    Heart murmur    Hyperlipidemia    IBS (irritable bowel syndrome)    Kidney stones    stage 3    Memory loss    Prostate cancer (HCC) 2003   sx 2003 and radation   Sleep apnea    no cpap per pt   Sleep apnea with use of continuous positive airway pressure (CPAP)    Past Surgical History:  Procedure Laterality Date   CARPAL TUNNEL RELEASE Bilateral    CERVICAL FUSION     COLONOSCOPY  2007, 08/09/2014   2007  [facility]/ [place]   COLONOSCOPY W/ POLYPECTOMY  2002   POLYPECTOMY  2016   polyps x3 T.A.   PROSTATECTOMY  2003   Dr.Davis   TONSILLECTOMY     Family History  Problem Relation Age of Onset   Stroke Father 54   Bladder Cancer Father    Breast cancer Mother    Diabetes Mother    Neuropathy Mother    Valvular heart disease Mother    Heart attack Mother 63   Prostate cancer Brother    Heart disease Brother    Heart attack Brother    Prostate cancer Brother    Colon cancer Neg Hx    Colon polyps Neg Hx    Esophageal cancer Neg Hx    Stomach cancer Neg Hx    Rectal cancer Neg Hx    Social History   Socioeconomic History    Marital status: Married    Spouse name: Not on file   Number of children: 1   Years of education: Not on file   Highest education level: 12th grade  Occupational History   Occupation: Retired  Tobacco Use   Smoking status: Former    Current packs/day: 0.00    Types: Cigarettes    Quit date: 08/04/1993    Years since quitting: 29.8   Smokeless tobacco: Never  Vaping Use   Vaping status: Never Used  Substance and Sexual Activity   Alcohol use: No   Drug use: No   Sexual activity: Yes  Other Topics Concern   Not on file  Social History Narrative   Not on file   Social Determinants of Health   Financial Resource Strain: Low Risk  (05/28/2023)   Overall Financial Resource Strain (CARDIA)    Difficulty of Paying Living Expenses: Not hard at all  Food Insecurity: No Food Insecurity (05/28/2023)   Hunger Vital Sign    Worried About Running Out of Food in the Last Year: Never true    Ran Out of Food in the Last Year: Never true  Transportation Needs: No Transportation Needs (05/28/2023)   PRAPARE - Administrator, Civil Service (Medical): No    Lack of Transportation (Non-Medical): No  Physical Activity: Unknown (05/28/2023)   Exercise Vital Sign    Days of Exercise per Week: 2 days    Minutes of Exercise per Session: Patient declined  Stress: Stress Concern Present (05/28/2023)   [employer] of Occupational Health - Occupational Stress Questionnaire    Feeling of Stress : To some extent  Social Connections: Unknown (05/28/2023)   Social Connection and Isolation Panel [NHANES]    Frequency of Communication with Friends and Family: Never    Frequency of Social Gatherings with Friends and Family: Patient declined    Attends Religious Services: More than 4 times per year    Active Member of [employer] or Organizations: Yes    Attends Banker Meetings: Never    Marital Status: Married    Tobacco Counseling Counseling given: Not  Answered   Clinical Intake:  Pre-visit preparation completed: Yes  Pain : No/denies pain     BMI - recorded: 24.48 Nutritional Status: BMI of 19-24  Normal Nutritional Risks: None Diabetes: No  How often do you need to have someone help you when you read instructions, pamphlets, or other written materials from your doctor or pharmacy?: 1 - Never  Interpreter Needed?: No  Information entered by :: Theresa Mulligan LPN   Activities of Daily Living  05/28/2023   12:00 PM  In your present state of health, do you have any difficulty performing the following activities:  Hearing? 1  Comment Wears hearing aids  Vision? 0  Difficulty concentrating or making decisions? 0  Walking or climbing stairs? 0  Dressing or bathing? 0  Doing errands, shopping? 0  Preparing Food and eating ? N  Using the Toilet? N  In the past six months, have you accidently leaked urine? Y  Comment Wears depends. Followed by Urologist  Do you have problems with loss of bowel control? Y  Comment Wears breis. Followed by medical attention  Managing your Medications? N  Managing your Finances? N  Housekeeping or managing your Housekeeping? N    Patient Care Team: Deeann Saint, MD as PCP - General (Family Medicine) Janalyn Harder, MD (Inactive) as Consulting Physician (Dermatology)  Indicate any recent Medical Services you may have received from other than [facility] providers in the past year (date may be approximate).     Assessment:   This is a routine wellness examination for Luis Villegas.  Hearing/Vision screen Hearing Screening - Comments:: Wears hearing aids Vision Screening - Comments:: - up to date with routine eye exams with  [facility]   Goals Addressed               This Visit's Progress     Increase physical activity (pt-stated)         Depression Screen    06/01/2023    3:38 PM 10/15/2022    1:26 PM 09/01/2022    2:00 PM 05/05/2022    9:47 AM 12/26/2021   10:38 AM  08/19/2021    1:32 PM 05/03/2021    8:24 AM  PHQ 2/9 Scores  PHQ - 2 Score 0 0 2 0 1 1 0  PHQ- 9 Score  5 6  3 4      Fall Risk    06/01/2023    3:30 PM 05/28/2023   12:00 PM 10/15/2022    1:26 PM 09/01/2022    2:00 PM 09/01/2022    1:05 PM  Fall Risk   Falls in the past year? 1 1 1 1 1   Number falls in past yr: 0 1 1 1 1   Injury with Fall? 0  0 1 1  Risk for fall due to : No Fall Risks   Impaired balance/gait   Follow up Falls prevention discussed  Falls evaluation completed Falls evaluation completed     MEDICARE RISK AT HOME: Medicare Risk at Home Any stairs in or around the home?: No If so, are there any without handrails?: No Home free of loose throw rugs in walkways, pet beds, electrical cords, etc?: Yes Adequate lighting in your home to reduce risk of falls?: Yes Life alert?: No Use of a cane, walker or w/c?: No Grab bars in the bathroom?: Yes Shower chair or bench in shower?: Yes Elevated toilet seat or a handicapped toilet?: Yes  TIMED UP AND GO:  Was the test performed?  No    Cognitive Function:      12/08/2022    2:55 PM  Montreal Cognitive Assessment   Visuospatial/ Executive (0/5) 4  Naming (0/3) 3  Attention: Read list of digits (0/2) 1  Attention: Read list of letters (0/1) 1  Attention: Serial 7 subtraction starting at 100 (0/3) 3  Language: Repeat phrase (0/2) 1  Language : Fluency (0/1) 0  Abstraction (0/2) 1  Delayed Recall (0/5) 0  Orientation (0/6) 5  Total 19      06/01/2023    3:31 PM 05/05/2022    9:49 AM 04/18/2020    9:53 AM  6CIT Screen  What Year? 0 points 0 points 0 points  What month? 0 points 0 points 0 points  What time? 0 points 0 points 0 points  Count back from 20 0 points 0 points 0 points  Months in reverse 0 points 0 points 0 points  Repeat phrase 0 points 4 points 4 points  Total Score 0 points 4 points 4 points    Immunizations Immunization History  Administered Date(s) Administered   Fluad Quad(high Dose 65+)  04/18/2020, 04/11/2021   Fluad Trivalent(High Dose 65+) 04/17/2023   Influenza, High Dose Seasonal PF 04/07/2018, 03/25/2019, 03/25/2019   Influenza-Unspecified 03/25/2019, 04/08/2022   PFIZER(Purple Top)SARS-COV-2 Vaccination 09/18/2019, 10/11/2019, 05/11/2020   Td 04/12/2008   Tdap 05/04/2018   Zoster, Live 01/25/2010    TDAP status: Up to date  Flu Vaccine status: Up to date  Pneumococcal vaccine status: Due, Education has been provided regarding the importance of this vaccine. Advised may receive this vaccine at local pharmacy or Health Dept. Aware to provide a copy of the vaccination record if obtained from local pharmacy or Health Dept. Verbalized acceptance and understanding.  Covid-19 vaccine status: Declined, Education has been provided regarding the importance of this vaccine but patient still declined. Advised may receive this vaccine at local pharmacy or Health Dept.or vaccine clinic. Aware to provide a copy of the vaccination record if obtained from local pharmacy or Health Dept. Verbalized acceptance and understanding.  Qualifies for Shingles Vaccine? Yes   Zostavax completed No   Shingrix Completed?: No.    Education has been provided regarding the importance of this vaccine. Patient has been advised to call insurance company to determine out of pocket expense if they have not yet received this vaccine. Advised may also receive vaccine at local pharmacy or Health Dept. Verbalized acceptance and understanding.  Screening Tests Health Maintenance  Topic Date Due   Hepatitis C Screening  Never done   Zoster Vaccines- Shingrix (1 of 2) 03/24/1966   Pneumonia Vaccine 100+ Years old (1 of 1 - PCV) Never done   Colonoscopy  12/21/2022   COVID-19 Vaccine (4 - 2023-24 season) 04/05/2023   Medicare Annual Wellness (AWV)  05/31/2024   DTaP/Tdap/Td (3 - Td or Tdap) 05/04/2028   INFLUENZA VACCINE  Completed   HPV VACCINES  Aged Out    Health Maintenance  Health Maintenance Due   Topic Date Due   Hepatitis C Screening  Never done   Zoster Vaccines- Shingrix (1 of 2) 03/24/1966   Pneumonia Vaccine 36+ Years old (1 of 1 - PCV) Never done   Colonoscopy  12/21/2022   COVID-19 Vaccine (4 - 2023-24 season) 04/05/2023    Colorectal cancer screening: Referral to GI placed Deferred. Pt aware the office will call re: appt.   Additional Screening:  Hepatitis C Screening: does qualify; Deferred  Vision Screening: Recommended annual ophthalmology exams for early detection of glaucoma and other disorders of the eye. Is the patient up to date with their annual eye exam?  Yes  Who is the provider or what is the name of the office in which the patient attends annual eye exams? [facility] If pt is not established with a provider, would they like to be referred to a provider to establish care? No .   Dental Screening: Recommended annual dental exams for proper oral hygiene  Community Resource Referral / Chronic Care Management:  CRR required this visit?  No   CCM required this visit?  No     Plan:     I have personally reviewed and noted the following in the patient's chart:   Medical and social history Use of alcohol, tobacco or illicit drugs  Current medications and supplements including opioid prescriptions. Patient is not currently taking opioid prescriptions. Functional ability and status Nutritional status Physical activity Advanced directives List of other physicians Hospitalizations, surgeries, and ER visits in previous 12 months Vitals Screenings to include cognitive, depression, and falls Referrals and appointments  In addition, I have reviewed and discussed with patient certain preventive protocols, quality metrics, and best practice recommendations. A written personalized care plan for preventive services as well as general preventive health recommendations were provided to patient.     Tillie Rung, LPN   [id number]   After  Visit Summary: (MyChart) Due to this being a telephonic visit, the after visit summary with patients personalized plan was offered to patient via MyChart   Nurse Notes: None

## 2023-06-01 NOTE — Patient Instructions (Addendum)
Mr. Chiu , Thank you for taking time to come for your Medicare Wellness Visit. I appreciate your ongoing commitment to your health goals. Please review the following plan we discussed and let me know if I can assist you in the future.   Referrals/Orders/Follow-Ups/Clinician Recommendations:   This is a list of the screening recommended for you and due dates:  Health Maintenance  Topic Date Due   Hepatitis C Screening  Never done   Zoster (Shingles) Vaccine (1 of 2) 03/24/1966   Pneumonia Vaccine (1 of 1 - PCV) Never done   Colon Cancer Screening  12/21/2022   COVID-19 Vaccine (4 - 2023-24 season) 04/05/2023   Medicare Annual Wellness Visit  05/31/2024   DTaP/Tdap/Td vaccine (3 - Td or Tdap) 05/04/2028   Flu Shot  Completed   HPV Vaccine  Aged Out    Advanced directives: (Declined) Advance directive discussed with you today. Even though you declined this today, please call our office should you change your mind, and we can give you the proper paperwork for you to fill out.  Next Medicare Annual Wellness Visit scheduled for next year: Yes

## 2023-06-17 ENCOUNTER — Ambulatory Visit: Payer: Medicare Other | Admitting: Neurology

## 2023-06-19 DIAGNOSIS — N3941 Urge incontinence: Secondary | ICD-10-CM | POA: Diagnosis not present

## 2023-06-19 DIAGNOSIS — N451 Epididymitis: Secondary | ICD-10-CM | POA: Diagnosis not present

## 2023-06-19 DIAGNOSIS — N200 Calculus of kidney: Secondary | ICD-10-CM | POA: Diagnosis not present

## 2023-06-26 DIAGNOSIS — Z1211 Encounter for screening for malignant neoplasm of colon: Secondary | ICD-10-CM | POA: Diagnosis not present

## 2023-07-04 LAB — COLOGUARD: COLOGUARD: NEGATIVE

## 2023-07-07 ENCOUNTER — Other Ambulatory Visit: Payer: Self-pay | Admitting: Family Medicine

## 2023-07-15 ENCOUNTER — Other Ambulatory Visit: Payer: Self-pay | Admitting: Family Medicine

## 2023-07-19 ENCOUNTER — Inpatient Hospital Stay (HOSPITAL_COMMUNITY)
Admission: EM | Admit: 2023-07-19 | Discharge: 2023-07-24 | DRG: 871 | Disposition: A | Discharge status: Home Health | Payer: Medicare Other | Attending: Internal Medicine | Admitting: Internal Medicine

## 2023-07-19 ENCOUNTER — Emergency Department (HOSPITAL_COMMUNITY): Payer: Medicare Other

## 2023-07-19 ENCOUNTER — Other Ambulatory Visit: Payer: Self-pay

## 2023-07-19 ENCOUNTER — Encounter (HOSPITAL_COMMUNITY): Payer: Self-pay

## 2023-07-19 DIAGNOSIS — N200 Calculus of kidney: Secondary | ICD-10-CM | POA: Diagnosis present

## 2023-07-19 DIAGNOSIS — R7881 Bacteremia: Secondary | ICD-10-CM | POA: Diagnosis not present

## 2023-07-19 DIAGNOSIS — N1831 Chronic kidney disease, stage 3a: Secondary | ICD-10-CM | POA: Diagnosis present

## 2023-07-19 DIAGNOSIS — Z8249 Family history of ischemic heart disease and other diseases of the circulatory system: Secondary | ICD-10-CM | POA: Diagnosis not present

## 2023-07-19 DIAGNOSIS — Z923 Personal history of irradiation: Secondary | ICD-10-CM

## 2023-07-19 DIAGNOSIS — K529 Noninfective gastroenteritis and colitis, unspecified: Secondary | ICD-10-CM | POA: Diagnosis not present

## 2023-07-19 DIAGNOSIS — R519 Headache, unspecified: Secondary | ICD-10-CM | POA: Diagnosis not present

## 2023-07-19 DIAGNOSIS — R413 Other amnesia: Secondary | ICD-10-CM | POA: Diagnosis not present

## 2023-07-19 DIAGNOSIS — R011 Cardiac murmur, unspecified: Secondary | ICD-10-CM | POA: Diagnosis present

## 2023-07-19 DIAGNOSIS — Z87891 Personal history of nicotine dependence: Secondary | ICD-10-CM | POA: Diagnosis not present

## 2023-07-19 DIAGNOSIS — K219 Gastro-esophageal reflux disease without esophagitis: Secondary | ICD-10-CM | POA: Diagnosis not present

## 2023-07-19 DIAGNOSIS — Z8042 Family history of malignant neoplasm of prostate: Secondary | ICD-10-CM

## 2023-07-19 DIAGNOSIS — Z8052 Family history of malignant neoplasm of bladder: Secondary | ICD-10-CM

## 2023-07-19 DIAGNOSIS — E785 Hyperlipidemia, unspecified: Secondary | ICD-10-CM | POA: Diagnosis not present

## 2023-07-19 DIAGNOSIS — Z981 Arthrodesis status: Secondary | ICD-10-CM

## 2023-07-19 DIAGNOSIS — A498 Other bacterial infections of unspecified site: Secondary | ICD-10-CM | POA: Diagnosis present

## 2023-07-19 DIAGNOSIS — Z043 Encounter for examination and observation following other accident: Secondary | ICD-10-CM | POA: Diagnosis not present

## 2023-07-19 DIAGNOSIS — K580 Irritable bowel syndrome with diarrhea: Secondary | ICD-10-CM | POA: Diagnosis present

## 2023-07-19 DIAGNOSIS — R9431 Abnormal electrocardiogram [ECG] [EKG]: Secondary | ICD-10-CM | POA: Diagnosis not present

## 2023-07-19 DIAGNOSIS — D631 Anemia in chronic kidney disease: Secondary | ICD-10-CM | POA: Diagnosis present

## 2023-07-19 DIAGNOSIS — G9340 Encephalopathy, unspecified: Secondary | ICD-10-CM | POA: Diagnosis not present

## 2023-07-19 DIAGNOSIS — G4730 Sleep apnea, unspecified: Secondary | ICD-10-CM | POA: Diagnosis not present

## 2023-07-19 DIAGNOSIS — A419 Sepsis, unspecified organism: Principal | ICD-10-CM | POA: Insufficient documentation

## 2023-07-19 DIAGNOSIS — N183 Chronic kidney disease, stage 3 unspecified: Secondary | ICD-10-CM | POA: Diagnosis not present

## 2023-07-19 DIAGNOSIS — N210 Calculus in bladder: Secondary | ICD-10-CM | POA: Diagnosis not present

## 2023-07-19 DIAGNOSIS — C61 Malignant neoplasm of prostate: Secondary | ICD-10-CM

## 2023-07-19 DIAGNOSIS — R300 Dysuria: Secondary | ICD-10-CM | POA: Diagnosis present

## 2023-07-19 DIAGNOSIS — F32A Depression, unspecified: Secondary | ICD-10-CM | POA: Diagnosis present

## 2023-07-19 DIAGNOSIS — Y92009 Unspecified place in unspecified non-institutional (private) residence as the place of occurrence of the external cause: Secondary | ICD-10-CM

## 2023-07-19 DIAGNOSIS — S0990XA Unspecified injury of head, initial encounter: Secondary | ICD-10-CM | POA: Diagnosis not present

## 2023-07-19 DIAGNOSIS — A4181 Sepsis due to Enterococcus: Secondary | ICD-10-CM | POA: Diagnosis present

## 2023-07-19 DIAGNOSIS — Z860100 Personal history of colon polyps, unspecified: Secondary | ICD-10-CM | POA: Diagnosis not present

## 2023-07-19 DIAGNOSIS — D649 Anemia, unspecified: Secondary | ICD-10-CM

## 2023-07-19 DIAGNOSIS — G9341 Metabolic encephalopathy: Principal | ICD-10-CM | POA: Diagnosis present

## 2023-07-19 DIAGNOSIS — Z1152 Encounter for screening for COVID-19: Secondary | ICD-10-CM

## 2023-07-19 DIAGNOSIS — B952 Enterococcus as the cause of diseases classified elsewhere: Secondary | ICD-10-CM | POA: Diagnosis not present

## 2023-07-19 DIAGNOSIS — Z87442 Personal history of urinary calculi: Secondary | ICD-10-CM

## 2023-07-19 DIAGNOSIS — I700 Atherosclerosis of aorta: Secondary | ICD-10-CM | POA: Diagnosis not present

## 2023-07-19 DIAGNOSIS — W19XXXA Unspecified fall, initial encounter: Secondary | ICD-10-CM | POA: Diagnosis present

## 2023-07-19 DIAGNOSIS — D696 Thrombocytopenia, unspecified: Secondary | ICD-10-CM | POA: Diagnosis not present

## 2023-07-19 DIAGNOSIS — Z9079 Acquired absence of other genital organ(s): Secondary | ICD-10-CM | POA: Diagnosis not present

## 2023-07-19 DIAGNOSIS — G4733 Obstructive sleep apnea (adult) (pediatric): Secondary | ICD-10-CM | POA: Diagnosis not present

## 2023-07-19 DIAGNOSIS — E876 Hypokalemia: Secondary | ICD-10-CM | POA: Diagnosis present

## 2023-07-19 DIAGNOSIS — Z79899 Other long term (current) drug therapy: Secondary | ICD-10-CM

## 2023-07-19 DIAGNOSIS — K4090 Unilateral inguinal hernia, without obstruction or gangrene, not specified as recurrent: Secondary | ICD-10-CM | POA: Diagnosis present

## 2023-07-19 DIAGNOSIS — Z8546 Personal history of malignant neoplasm of prostate: Secondary | ICD-10-CM | POA: Diagnosis not present

## 2023-07-19 DIAGNOSIS — I959 Hypotension, unspecified: Secondary | ICD-10-CM | POA: Diagnosis not present

## 2023-07-19 LAB — CBC WITH DIFFERENTIAL/PLATELET
Abs Immature Granulocytes: 0.04 10*3/uL (ref 0.00–0.07)
Basophils Absolute: 0 10*3/uL (ref 0.0–0.1)
Basophils Relative: 0 %
Eosinophils Absolute: 0 10*3/uL (ref 0.0–0.5)
Eosinophils Relative: 0 %
HCT: 38.2 % — ABNORMAL LOW (ref 39.0–52.0)
Hemoglobin: 12.2 g/dL — ABNORMAL LOW (ref 13.0–17.0)
Immature Granulocytes: 0 %
Lymphocytes Relative: 7 %
Lymphs Abs: 1 10*3/uL (ref 0.7–4.0)
MCH: 32.3 pg (ref 26.0–34.0)
MCHC: 31.9 g/dL (ref 30.0–36.0)
MCV: 101.1 fL — ABNORMAL HIGH (ref 80.0–100.0)
Monocytes Absolute: 1.2 10*3/uL — ABNORMAL HIGH (ref 0.1–1.0)
Monocytes Relative: 9 %
Neutro Abs: 11.1 10*3/uL — ABNORMAL HIGH (ref 1.7–7.7)
Neutrophils Relative %: 84 %
Platelets: 182 10*3/uL (ref 150–400)
RBC: 3.78 MIL/uL — ABNORMAL LOW (ref 4.22–5.81)
RDW: 13.7 % (ref 11.5–15.5)
WBC: 13.3 10*3/uL — ABNORMAL HIGH (ref 4.0–10.5)
nRBC: 0 % (ref 0.0–0.2)

## 2023-07-19 LAB — PROTEIN AND GLUCOSE, CSF
Glucose, CSF: 71 mg/dL — ABNORMAL HIGH (ref 40–70)
Total  Protein, CSF: 34 mg/dL (ref 15–45)

## 2023-07-19 LAB — URINALYSIS, W/ REFLEX TO CULTURE (INFECTION SUSPECTED)
Bilirubin Urine: NEGATIVE
Glucose, UA: NEGATIVE mg/dL
Hgb urine dipstick: NEGATIVE
Ketones, ur: NEGATIVE mg/dL
Leukocytes,Ua: NEGATIVE
Nitrite: NEGATIVE
Protein, ur: 30 mg/dL — AB
Specific Gravity, Urine: 1.017 (ref 1.005–1.030)
pH: 7 (ref 5.0–8.0)

## 2023-07-19 LAB — RESP PANEL BY RT-PCR (RSV, FLU A&B, COVID)  RVPGX2
Influenza A by PCR: NEGATIVE
Influenza B by PCR: NEGATIVE
Resp Syncytial Virus by PCR: NEGATIVE
SARS Coronavirus 2 by RT PCR: NEGATIVE

## 2023-07-19 LAB — BASIC METABOLIC PANEL
Anion gap: 6 (ref 5–15)
BUN: 27 mg/dL — ABNORMAL HIGH (ref 8–23)
CO2: 24 mmol/L (ref 22–32)
Calcium: 9.2 mg/dL (ref 8.9–10.3)
Chloride: 106 mmol/L (ref 98–111)
Creatinine, Ser: 1.49 mg/dL — ABNORMAL HIGH (ref 0.61–1.24)
GFR, Estimated: 48 mL/min — ABNORMAL LOW (ref >60)
Glucose, Bld: 100 mg/dL — ABNORMAL HIGH (ref 70–99)
Potassium: 3.8 mmol/L (ref 3.5–5.1)
Sodium: 136 mmol/L (ref 135–145)

## 2023-07-19 LAB — PROTIME-INR
INR: 1 (ref 0.8–1.2)
Prothrombin Time: 13.9 s (ref 11.4–15.2)

## 2023-07-19 LAB — I-STAT CG4 LACTIC ACID, ED
Lactic Acid, Venous: 2 mmol/L (ref 0.5–1.9)
Lactic Acid, Venous: 1.2 mmol/L (ref 0.5–1.9)

## 2023-07-19 LAB — APTT: aPTT: 27 s (ref 24–36)

## 2023-07-19 MED ORDER — ONDANSETRON HCL 4 MG PO TABS: 4.0000 mg | ORAL_TABLET | Freq: Four times a day (QID) | ORAL | Status: DC | PRN

## 2023-07-19 MED ORDER — ONDANSETRON HCL 4 MG/2ML IJ SOLN: 4.0000 mg | Freq: Four times a day (QID) | INTRAMUSCULAR | Status: DC | PRN

## 2023-07-19 MED ORDER — SODIUM CHLORIDE 0.9 % IV SOLN
2.0000 g | INTRAVENOUS | Status: DC
  Administered 2023-07-19: 2 g via INTRAVENOUS
  Filled 2023-07-19: qty 20

## 2023-07-19 MED ORDER — PANTOPRAZOLE SODIUM 40 MG PO TBEC
40.0000 mg | DELAYED_RELEASE_TABLET | Freq: Every day | ORAL | Status: DC
  Administered 2023-07-20 – 2023-07-24 (×5): 40 mg via ORAL
  Filled 2023-07-19 (×5): qty 1

## 2023-07-19 MED ORDER — ACETAMINOPHEN 500 MG PO TABS
1000.0000 mg | ORAL_TABLET | Freq: Once | ORAL | Status: AC
  Administered 2023-07-19: 1000 mg via ORAL
  Filled 2023-07-19: qty 2

## 2023-07-19 MED ORDER — ENOXAPARIN SODIUM 30 MG/0.3ML IJ SOSY: 30.0000 mg | PREFILLED_SYRINGE | INTRAMUSCULAR | Status: DC

## 2023-07-19 MED ORDER — ACETAMINOPHEN 500 MG PO TABS
500.0000 mg | ORAL_TABLET | Freq: Four times a day (QID) | ORAL | Status: DC | PRN
  Administered 2023-07-20 – 2023-07-24 (×9): 500 mg via ORAL
  Filled 2023-07-19 (×9): qty 1

## 2023-07-19 MED ORDER — LACTATED RINGERS IV BOLUS
1000.0000 mL | Freq: Once | INTRAVENOUS | Status: AC
  Administered 2023-07-19: 1000 mL via INTRAVENOUS

## 2023-07-19 MED ORDER — ACETAMINOPHEN 650 MG RE SUPP: 650.0000 mg | Freq: Four times a day (QID) | RECTAL | Status: DC | PRN

## 2023-07-19 MED ORDER — LACTATED RINGERS IV SOLN: INTRAVENOUS | Status: DC

## 2023-07-19 MED ORDER — SORBITOL 70 % SOLN
30.0000 mL | Freq: Every day | Status: DC | PRN
Start: 2023-07-19 — End: 2023-07-21

## 2023-07-19 MED ORDER — CITALOPRAM HYDROBROMIDE 20 MG PO TABS
20.0000 mg | ORAL_TABLET | Freq: Every day | ORAL | Status: DC
  Administered 2023-07-20 – 2023-07-24 (×5): 20 mg via ORAL
  Filled 2023-07-19 (×5): qty 1

## 2023-07-19 MED ORDER — MIRABEGRON ER 25 MG PO TB24
25.0000 mg | ORAL_TABLET | Freq: Every day | ORAL | Status: DC
  Administered 2023-07-20 – 2023-07-24 (×5): 25 mg via ORAL
  Filled 2023-07-19 (×5): qty 1

## 2023-07-19 MED ORDER — BUPROPION HCL ER (XL) 150 MG PO TB24
150.0000 mg | ORAL_TABLET | Freq: Every day | ORAL | Status: DC
  Administered 2023-07-20 – 2023-07-24 (×5): 150 mg via ORAL
  Filled 2023-07-19 (×5): qty 1

## 2023-07-19 NOTE — ED Provider Notes (Signed)
[place] [facility]PARTMENT AT [facility] Provider Note   CSN: [id number] Arrival date & time: 07/19/23  1831     History  Chief Complaint  Patient presents with   Luis Villegas is a 77 y.o. male.  Patient is a 77 year old male with a history of depression, hyperlipidemia, prostate cancer, GERD and some memory loss who presents today after a fall at home.  Patient reports that he remembers falling but is not sure why he fell and reports being on the ground for 2 hours before someone found him.  He is complaining of a mild headache but otherwise has no other complaints.  It was reported by EMS that he fell and hit his head on the floor and that patient had been altered.  For me he denies any LOC and reports remembers falling.  He denies pain in his arms or legs.  He reports feeling okay earlier today denied cough, chest pain, shortness of breath, abdominal pain, nausea or vomiting.  Did report he had eaten today.  The history is provided by the patient, the EMS personnel and medical records.  Fall       Home Medications Prior to Admission medications   Medication Sig Start Date End Date Taking? Authorizing Provider  acetaminophen-codeine (TYLENOL #3) 300-30 MG tablet Take 1 tablet by mouth every 6 (six) hours as needed for moderate pain. 06/20/22   Magnant, Charles L, PA-C  acyclovir (ZOVIRAX) 200 MG capsule TAKE 1 CAPSULE(200 MG) BY MOUTH DAILY 12/31/22   Deeann Saint, MD  azelastine (OPTIVAR) 0.05 % ophthalmic solution Apply 1 drop to eye 2 (two) times daily. 08/26/22   [provider]  buPROPion (WELLBUTRIN XL) 150 MG 24 hr tablet TAKE 1 TABLET(150 MG) BY MOUTH DAILY 07/13/23   Deeann Saint, MD  citalopram (CELEXA) 20 MG tablet TAKE 1 TABLET(20 MG) BY MOUTH DAILY 10/02/22   Deeann Saint, MD  loperamide (IMODIUM A-D) 2 MG tablet Use a 1/2 tablet every morning and titrate as needed. 06/11/22   Zehr, Princella Pellegrini, PA-C  Multiple  Vitamin (MULTIVITAMIN ADULT PO) Take by mouth.    [provider]  MYRBETRIQ 25 MG TB24 tablet Take 25 mg by mouth daily. 09/16/22   [provider]  omeprazole (PRILOSEC) 20 MG capsule TAKE 1 CAPSULE BY MOUTH EVERY DAY 01/02/23   Deeann Saint, MD  SUMAtriptan (IMITREX) 100 MG tablet TAKE 1 TABLET BY MOUTH AS NEEDED FOR HEADACHE 07/17/23   Deeann Saint, MD  tamsulosin (FLOMAX) 0.4 MG CAPS capsule Take 0.4 mg by mouth daily. 06/24/21   [provider]  topiramate (TOPAMAX) 200 MG tablet TAKE 1 TABLET(200 MG) BY MOUTH AT BEDTIME 01/02/23   Deeann Saint, MD      Allergies    Patient has no active allergies.    Review of Systems   Review of Systems  Physical Exam Updated Vital Signs BP 118/64   Pulse 90   Temp (!) 102.2 F (39 C) (Rectal)   Resp 20   SpO2 95%  Physical Exam Vitals and nursing note reviewed.  Constitutional:      General: He is not in acute distress.    Appearance: He is well-developed.  HENT:     Head: Normocephalic and atraumatic.     Comments: No obvious hematomas to the forehead or scalp Eyes:     General: No visual field deficit.    Conjunctiva/sclera: Conjunctivae normal.  Pupils: Pupils are equal, round, and reactive to light.     Comments: Pupils are 2 mm and sluggishly reactive bilaterally  Cardiovascular:     Rate and Rhythm: Normal rate and regular rhythm.     Heart sounds: No murmur heard. Pulmonary:     Effort: Pulmonary effort is normal. No respiratory distress.     Breath sounds: Normal breath sounds. No wheezing or rales.  Abdominal:     General: There is no distension.     Palpations: Abdomen is soft.     Tenderness: There is no abdominal tenderness. There is no guarding or rebound.  Musculoskeletal:        General: No tenderness. Normal range of motion.     Cervical back: Normal range of motion and neck supple. No spinous process tenderness or muscular tenderness.     Right lower leg: No edema.      Left lower leg: No edema.  Skin:    General: Skin is warm and dry.     Findings: No erythema or rash.  Neurological:     Mental Status: He is alert.     Cranial Nerves: No dysarthria or facial asymmetry.     Sensory: Sensation is intact.     Motor: Motor function is intact. No weakness or pronator drift.     Coordination: Coordination normal. Finger-Nose-Finger Test and Heel to [place] Test normal.     Comments: Awake and oriented to self and place.  He is able to follow commands without difficulty.  However seems slightly distant.  Gait not tested due to recent fall  Psychiatric:        Behavior: Behavior normal.     ED Results / Procedures / Treatments   Labs (all labs ordered are listed, but only abnormal results are displayed) Labs Reviewed  CBC WITH DIFFERENTIAL/PLATELET - Abnormal; Notable for the following components:      Result Value   WBC 13.3 (*)    RBC 3.78 (*)    Hemoglobin 12.2 (*)    HCT 38.2 (*)    MCV 101.1 (*)    Neutro Abs 11.1 (*)    Monocytes Absolute 1.2 (*)    All other components within normal limits  BASIC METABOLIC PANEL - Abnormal; Notable for the following components:   Glucose, Bld 100 (*)    BUN 27 (*)    Creatinine, Ser 1.49 (*)    GFR, Estimated 48 (*)    All other components within normal limits  URINALYSIS, W/ REFLEX TO CULTURE (INFECTION SUSPECTED) - Abnormal; Notable for the following components:   Protein, ur 30 (*)    Bacteria, UA RARE (*)    All other components within normal limits  I-STAT CG4 LACTIC ACID, ED - Abnormal; Notable for the following components:   Lactic Acid, Venous 2.0 (*)    All other components within normal limits  RESP PANEL BY RT-PCR (RSV, FLU A&B, COVID)  RVPGX2  CULTURE, BLOOD (ROUTINE X 2)  CULTURE, BLOOD (ROUTINE X 2)  CSF CULTURE W GRAM STAIN  PROTIME-INR  APTT  CSF CELL COUNT WITH DIFFERENTIAL  PROTEIN AND GLUCOSE, CSF  MENINGITIS/ENCEPHALITIS PANEL (CSF)  I-STAT CG4 LACTIC ACID, ED     EKG None  Radiology CT Head Wo Contrast Result Date: 07/19/2023 CLINICAL DATA:  Head trauma, minor (Age >= 65y) EXAM: CT HEAD WITHOUT CONTRAST TECHNIQUE: Contiguous axial images were obtained from the base of the skull through the vertex without intravenous contrast. RADIATION DOSE REDUCTION: This exam  was performed according to the departmental dose-optimization program which includes automated exposure control, adjustment of the mA and/or kV according to patient size and/or use of iterative reconstruction technique. COMPARISON:  09/05/2022 FINDINGS: Brain: Normal anatomic configuration. Parenchymal volume loss is commensurate with the patient's age. Stable mild periventricular white matter changes are present likely reflecting the sequela of small vessel ischemia. No abnormal intra or extra-axial mass lesion or fluid collection. No abnormal mass effect or midline shift. No evidence of acute intracranial hemorrhage or infarct. Ventricular size is normal. Cerebellum unremarkable. Vascular: No asymmetric hyperdense vasculature at the skull base. Skull: Intact Sinuses/Orbits: Paranasal sinuses are clear. Orbits are unremarkable. Other: Mastoid air cells and middle ear cavities are clear. IMPRESSION: 1. No acute intracranial hemorrhage or infarct. No calvarial fracture. 2. Stable mild senescent change. Electronically Signed   By: Helyn Numbers M.D.   On: 07/19/2023 19:55   DG Chest Port 1 View Result Date: 07/19/2023 CLINICAL DATA:  Fall. On floor for 2 hours until EMS called. Pupils are pinpoint and nonreactive 2 light. Does not remember fall. EXAM: PORTABLE CHEST 1 VIEW COMPARISON:  11/27/2008 FINDINGS: Stable cardiomediastinal silhouette. Aortic atherosclerotic calcification. No focal consolidation, pleural effusion, or pneumothorax. No displaced rib fractures. Cervical spine fusion IMPRESSION: No acute cardiopulmonary disease. Electronically Signed   By: Minerva Fester M.D.   On: 07/19/2023 19:34     Procedures Lumbar Puncture  Date/Time: 07/19/2023 10:40 PM  Performed by: Gwyneth Sprout, MD Authorized by: Gwyneth Sprout, MD   Consent:    Consent obtained:  Verbal   Consent given by:  Patient   Risks, benefits, and alternatives were discussed: yes     Risks discussed:  Headache, pain, bleeding and infection   Alternatives discussed:  No treatment Universal protocol:    Procedure explained and questions answered to patient or proxy's satisfaction: yes     Relevant documents present and verified: yes     Imaging studies available: yes     Immediately prior to procedure a time out was called: yes     Patient identity confirmed:  Verbally with patient Pre-procedure details:    Procedure purpose:  Diagnostic   Preparation: Patient was prepped and draped in usual sterile fashion   Anesthesia:    Anesthesia method:  Local infiltration   Local anesthetic:  Lidocaine 2% WITH epi Procedure details:    Lumbar space:  L4-L5 interspace   Patient position:  Sitting   Needle gauge:  20   Needle type:  Diamond point   Needle length (in):  1.5   Ultrasound guidance: no     Number of attempts:  2   Fluid appearance:  Blood-tinged   Tubes of fluid:  4   Total volume (ml):  8 Post-procedure details:    Puncture site:  Direct pressure applied   Procedure completion:  Tolerated     Medications Ordered in ED Medications  lactated ringers infusion (has no administration in time range)  cefTRIAXone (ROCEPHIN) 2 g in sodium chloride 0.9 % 100 mL IVPB (0 g Intravenous Stopped 07/19/23 2109)  acetaminophen (TYLENOL) tablet 1,000 mg (1,000 mg Oral Given 07/19/23 1928)  lactated ringers bolus 1,000 mL (1,000 mLs Intravenous New Bag/Given 07/19/23 2014)    ED Course/ Medical Decision Making/ A&P                                 Medical Decision Making Amount and/or Complexity of Data  Reviewed Independent Historian: spouse External Data Reviewed: notes. Labs: ordered.  Decision-making details documented in ED Course. Radiology: ordered and independent interpretation performed. Decision-making details documented in ED Course. ECG/medicine tests: ordered and independent interpretation performed. Decision-making details documented in ED Course.  Risk OTC drugs. Prescription drug management. Decision regarding hospitalization.   Pt with multiple medical problems and comorbidities and presenting today with a complaint that caries a high risk for morbidity and mortality.  Patient presenting today after a fall with an unknown mechanism.  Patient is awake and answering questions appropriately but seems slightly distance.  Similar symptoms in February when he was diagnosed with flu.  Patient's temperature today is 99.9 and concern for possible early infectious etiology although patient does not have any distinct complaints of a localized source.  Also has a history of prostate cancer concern for possible brain mets, intracranial bleed after his fall, cardiac cause for his fall such as dysrhythmia.  Patient is well-appearing on exam and can follow all neurologic request and has no focal abnormalities to suggest stroke.  Rectal temp is pending.  Labs and imaging pending.  Will try to get collateral information from patient's wife.  Patient denies any new medications.  10:42 PM Rectal temp of 102.2.  Patient is wife is now here and reports that he has absolutely no memory problems and had driven to [employer] earlier today to get Christmas lights.  For the last few days he had been complaining of some dysuria.  She reports the way he is acting right now is completely different than his baseline.  Given now documented fever and symptoms code sepsis was initiated.  I independently interpreted patient's EKG and labs.  EKG without acute findings, lactic acid elevated at 2, CBC with leukocytosis of 13 and stable hemoglobin of 12, BMP with normal electrolytes creatinine of 1.49 and  coags within normal limits. I have independently visualized and interpreted pt's images today.  Head CT without evidence of bleed today and chest x-ray is clear.  Given patient's wife report of dysuria in the last few days patient was covered presumably with Rocephin for most likely UTI.  In and out cath still pending.  Given patient's delirium from early sepsis we will plan on admission for IV antibiotics.  10:42 PM Followed up on UA and patient does not have evidence of UTI.  Given his persistent fever of unknown origin and change in mental status we will do an LP to ensure no evidence of meningitis/encephalitis. LP results pending.  Will admit for sepsis and acute metabolic encephalopathy         Final Clinical Impression(s) / ED Diagnoses Final diagnoses:  Sepsis with encephalopathy without septic shock, due to unspecified organism [facility])    Rx / DC Orders ED Discharge Orders     None         Gwyneth Sprout, MD 07/19/23 2242

## 2023-07-19 NOTE — ED Notes (Signed)
Patient transported to CT 

## 2023-07-19 NOTE — ED Notes (Signed)
LP completed, procedure finished

## 2023-07-19 NOTE — Sepsis Progress Note (Signed)
[facility] following for sepsis protocol. 

## 2023-07-19 NOTE — ED Triage Notes (Signed)
Pt BIBA from home, c/o fall. Pt did hit his head on the floor.  Was on the floor for 2 hours until EMS was called. Per EMS pupils are pinpoint and non reactive to light. Does not remember fall. Denies LOC.   CBG 124

## 2023-07-19 NOTE — ED Notes (Signed)
LP being performed by Dr. Anitra Lauth at bedside

## 2023-07-19 NOTE — ED Notes (Signed)
ED TO INPATIENT HANDOFF REPORT  ED Nurse Name and Phone #: Thamas Jaegers Name/Age/Gender Luis Villegas 77 y.o. male Room/Bed: WA01/WA01  Code Status   Code Status: Not on file  Home/SNF/Other Home Patient oriented to: self and place Is this baseline? No   Triage Complete: Triage complete  Chief Complaint Sepsis (HCC) [A41.9]  Triage Note Pt BIBA from home, c/o fall. Pt did hit his head on the floor.  Was on the floor for 2 hours until EMS was called. Per EMS pupils are pinpoint and non reactive to light. Does not remember fall. Denies LOC.   CBG 124    Allergies No Active Allergies  Level of Care/Admitting Diagnosis ED Disposition     ED Disposition  Admit   Condition  --   Comment  Hospital Area: [facility]  HOSPITAL [[id number]]  Level of Care: Telemetry [5]  Admit to tele based on following criteria: Other see comments  Comments: sepsis  May admit patient to [facility] or [facility] if equivalent level of care is available:: Yes  Covid Evaluation: Confirmed COVID Negative  Diagnosis: Sepsis [facility]) [[id number]]  Admitting Physician: Buena Irish [[id number]]  Attending Physician: Buena Irish [[id number]]  Certification:: I certify this patient will need inpatient services for at least 2 midnights  Expected Medical Readiness: 07/23/2023          B Medical/Surgery History Past Medical History:  Diagnosis Date   Allergy    Colon polyps 2016   Depression    GERD (gastroesophageal reflux disease)    Heart murmur    Hyperlipidemia    IBS (irritable bowel syndrome)    Kidney stones    stage 3    Memory loss    Prostate cancer (HCC) 2003   sx 2003 and radation   Sleep apnea    no cpap per pt   Sleep apnea with use of continuous positive airway pressure (CPAP)    Past Surgical History:  Procedure Laterality Date   CARPAL TUNNEL RELEASE Bilateral    CERVICAL FUSION     COLONOSCOPY  2007, 08/09/2014   2007 [place]/ 2016 [place]    COLONOSCOPY W/ POLYPECTOMY  2002   POLYPECTOMY  2016   polyps x3 T.A.   PROSTATECTOMY  2003   Dr.Davis   TONSILLECTOMY       A IV Location/Drains/Wounds Patient Lines/Drains/Airways Status     Active Line/Drains/Airways     Name Placement date Placement time Site Days   Peripheral IV 07/19/23 18 G Anterior;Distal;Left;Upper Arm 07/19/23  1925  Arm  less than 1   Peripheral IV 07/19/23 20 G Right Antecubital 07/19/23  1925  Antecubital  less than 1            Intake/Output Last 24 hours No intake or output data in the 24 hours ending 07/19/23 2306  Labs/Imaging Results for orders placed or performed during the hospital encounter of 07/19/23 (from the past 48 hours)  CBC with Differential/Platelet     Status: Abnormal   Collection Time: 07/19/23  7:16 PM  Result Value Ref Range   WBC 13.3 (H) 4.0 - 10.5 K/uL   RBC 3.78 (L) 4.22 - 5.81 MIL/uL   Hemoglobin 12.2 (L) 13.0 - 17.0 g/dL   HCT 27.2 (L) 53.6 - 64.4 %   MCV 101.1 (H) 80.0 - 100.0 fL   MCH 32.3 26.0 - 34.0 pg   MCHC 31.9 30.0 - 36.0 g/dL   RDW 03.4 74.2 - 59.5 %  Platelets 182 150 - 400 K/uL   nRBC 0.0 0.0 - 0.2 %   Neutrophils Relative % 84 %   Neutro Abs 11.1 (H) 1.7 - 7.7 K/uL   Lymphocytes Relative 7 %   Lymphs Abs 1.0 0.7 - 4.0 K/uL   Monocytes Relative 9 %   Monocytes Absolute 1.2 (H) 0.1 - 1.0 K/uL   Eosinophils Relative 0 %   Eosinophils Absolute 0.0 0.0 - 0.5 K/uL   Basophils Relative 0 %   Basophils Absolute 0.0 0.0 - 0.1 K/uL   Immature Granulocytes 0 %   Abs Immature Granulocytes 0.04 0.00 - 0.07 K/uL    Comment: Performed at [facility], [address] [place], [place] [address]  Basic metabolic panel     Status: Abnormal   Collection Time: 07/19/23  7:16 PM  Result Value Ref Range   Sodium 136 135 - 145 mmol/L   Potassium 3.8 3.5 - 5.1 mmol/L   Chloride 106 98 - 111 mmol/L   CO2 24 22 - 32 mmol/L   Glucose, Bld 100 (H) 70 - 99 mg/dL    Comment: Glucose reference  range applies only to samples taken after fasting for at least 8 hours.   BUN 27 (H) 8 - 23 mg/dL   Creatinine, Ser 6.04 (H) 0.61 - 1.24 mg/dL   Calcium 9.2 8.9 - 54.0 mg/dL   GFR, Estimated 48 (L) >60 mL/min    Comment: (NOTE) Calculated using the CKD-EPI Creatinine Equation (2021)    Anion gap 6 5 - 15    Comment: Performed at [facility], [address], [place], [place] [address]  Protime-INR     Status: None   Collection Time: 07/19/23  7:16 PM  Result Value Ref Range   Prothrombin Time 13.9 11.4 - 15.2 seconds   INR 1.0 0.8 - 1.2    Comment: (NOTE) INR goal varies based on device and disease states. Performed at [facility], [address] [place], [place] [address]   APTT     Status: None   Collection Time: 07/19/23  7:16 PM  Result Value Ref Range   aPTT 27 24 - 36 seconds    Comment: Performed at [facility], [address], [place], [place] [address]  I-Stat Lactic Acid, ED     Status: Abnormal   Collection Time: 07/19/23  7:30 PM  Result Value Ref Range   Lactic Acid, Venous 2.0 (HH) 0.5 - 1.9 mmol/L   Comment NOTIFIED PHYSICIAN   Resp panel by RT-PCR (RSV, Flu A&B, Covid) Anterior Nasal Swab     Status: None   Collection Time: 07/19/23  7:43 PM   Specimen: Anterior Nasal Swab  Result Value Ref Range   SARS Coronavirus 2 by RT PCR NEGATIVE NEGATIVE    Comment: (NOTE) SARS-CoV-2 target nucleic acids are NOT DETECTED.  The SARS-CoV-2 RNA is generally detectable in upper respiratory specimens during the acute phase of infection. The lowest concentration of SARS-CoV-2 viral copies this assay can detect is 138 copies/mL. A negative result does not preclude SARS-Cov-2 infection and should not be used as the sole basis for treatment or other patient management decisions. A negative result may occur with  improper specimen collection/handling, submission of specimen other than nasopharyngeal swab, presence of  viral mutation(s) within the areas targeted by this assay, and inadequate number of viral copies(<138 copies/mL). A negative result must be combined with clinical observations, patient history, and epidemiological information. The  expected result is Negative.  Fact Sheet for Patients:  BloggerCourse.com  Fact Sheet for Healthcare Providers:  SeriousBroker.it  This test is no t yet approved or cleared by the [place] FDA and  has been authorized for detection and/or diagnosis of SARS-CoV-2 by FDA under an Emergency Use Authorization (EUA). This EUA will remain  in effect (meaning this test can be used) for the duration of the COVID-19 declaration under Section 564(b)(1) of the Act, 21 U.S.C.section 360bbb-3(b)(1), unless the authorization is terminated  or revoked sooner.       Influenza A by PCR NEGATIVE NEGATIVE   Influenza B by PCR NEGATIVE NEGATIVE    Comment: (NOTE) The Xpert Xpress SARS-CoV-2/FLU/RSV plus assay is intended as an aid in the diagnosis of influenza from Nasopharyngeal swab specimens and should not be used as a sole basis for treatment. Nasal washings and aspirates are unacceptable for Xpert Xpress SARS-CoV-2/FLU/RSV testing.  Fact Sheet for Patients: BloggerCourse.com  Fact Sheet for Healthcare Providers: SeriousBroker.it  This test is not yet approved or cleared by the [place] FDA and has been authorized for detection and/or diagnosis of SARS-CoV-2 by FDA under an Emergency Use Authorization (EUA). This EUA will remain in effect (meaning this test can be used) for the duration of the COVID-19 declaration under Section 564(b)(1) of the Act, 21 U.S.C. section 360bbb-3(b)(1), unless the authorization is terminated or revoked.     Resp Syncytial Virus by PCR NEGATIVE NEGATIVE    Comment: (NOTE) Fact Sheet for  Patients: BloggerCourse.com  Fact Sheet for Healthcare Providers: SeriousBroker.it  This test is not yet approved or cleared by the [place] FDA and has been authorized for detection and/or diagnosis of SARS-CoV-2 by FDA under an Emergency Use Authorization (EUA). This EUA will remain in effect (meaning this test can be used) for the duration of the COVID-19 declaration under Section 564(b)(1) of the Act, 21 U.S.C. section 360bbb-3(b)(1), unless the authorization is terminated or revoked.  Performed at [facility], [address] [place], [place] [address]   Urinalysis, w/ Reflex to Culture (Infection Suspected) -Urine, Clean Catch     Status: Abnormal   Collection Time: 07/19/23  9:14 PM  Result Value Ref Range   Specimen Source URINE, CLEAN CATCH    Color, Urine YELLOW YELLOW   APPearance CLEAR CLEAR   Specific Gravity, Urine 1.017 1.005 - 1.030   pH 7.0 5.0 - 8.0   Glucose, UA NEGATIVE NEGATIVE mg/dL   Hgb urine dipstick NEGATIVE NEGATIVE   Bilirubin Urine NEGATIVE NEGATIVE   Ketones, ur NEGATIVE NEGATIVE mg/dL   Protein, ur 30 (A) NEGATIVE mg/dL   Nitrite NEGATIVE NEGATIVE   Leukocytes,Ua NEGATIVE NEGATIVE   RBC / HPF 6-10 0 - 5 RBC/hpf   WBC, UA 0-5 0 - 5 WBC/hpf    Comment:        Reflex urine culture not performed if WBC <=10, OR if Squamous epithelial cells >5. If Squamous epithelial cells >5 suggest recollection.    Bacteria, UA RARE (A) NONE SEEN   Squamous Epithelial / HPF 0-5 0 - 5 /HPF    Comment: Performed at [facility], [address] [place], [place] [address]  I-Stat Lactic Acid, ED     Status: None   Collection Time: 07/19/23  9:36 PM  Result Value Ref Range   Lactic Acid, Venous 1.2 0.5 - 1.9 mmol/L   CT Head Wo Contrast Result Date: 07/19/2023 CLINICAL DATA:  Head trauma, minor (Age >=  65y) EXAM: CT HEAD WITHOUT CONTRAST TECHNIQUE: Contiguous axial  images were obtained from the base of the skull through the vertex without intravenous contrast. RADIATION DOSE REDUCTION: This exam was performed according to the departmental dose-optimization program which includes automated exposure control, adjustment of the mA and/or kV according to patient size and/or use of iterative reconstruction technique. COMPARISON:  09/05/2022 FINDINGS: Brain: Normal anatomic configuration. Parenchymal volume loss is commensurate with the patient's age. Stable mild periventricular white matter changes are present likely reflecting the sequela of small vessel ischemia. No abnormal intra or extra-axial mass lesion or fluid collection. No abnormal mass effect or midline shift. No evidence of acute intracranial hemorrhage or infarct. Ventricular size is normal. Cerebellum unremarkable. Vascular: No asymmetric hyperdense vasculature at the skull base. Skull: Intact Sinuses/Orbits: Paranasal sinuses are clear. Orbits are unremarkable. Other: Mastoid air cells and middle ear cavities are clear. IMPRESSION: 1. No acute intracranial hemorrhage or infarct. No calvarial fracture. 2. Stable mild senescent change. Electronically Signed   By: Helyn Numbers M.D.   On: 07/19/2023 19:55   DG Chest Port 1 View Result Date: 07/19/2023 CLINICAL DATA:  Fall. On floor for 2 hours until EMS called. Pupils are pinpoint and nonreactive 2 light. Does not remember fall. EXAM: PORTABLE CHEST 1 VIEW COMPARISON:  11/27/2008 FINDINGS: Stable cardiomediastinal silhouette. Aortic atherosclerotic calcification. No focal consolidation, pleural effusion, or pneumothorax. No displaced rib fractures. Cervical spine fusion IMPRESSION: No acute cardiopulmonary disease. Electronically Signed   By: Minerva Fester M.D.   On: 07/19/2023 19:34    Pending Labs Unresulted Labs (From admission, onward)     Start     Ordered   07/19/23 2240  Meningitis/Encephalitis Panel (CSF)  Once,   URGENT        07/19/23 2240    07/19/23 2239  CSF cell count with differential  ONCE - STAT,   STAT       Question:  Are there also cytology or pathology orders on this specimen?  Answer:  No   07/19/23 2240   07/19/23 2239  Protein and glucose, CSF  ONCE - STAT,   STAT        07/19/23 2240   07/19/23 2239  CSF culture w Gram Stain  ONCE - URGENT,   URGENT       Question Answer Comment  Are there also cytology or pathology orders on this specimen? Yes   Patient immune status Normal      07/19/23 2240   07/19/23 1920  Blood Culture (routine x 2)  (Undifferentiated presentation (screening labs and basic nursing orders))  BLOOD CULTURE X 2,   STAT      07/19/23 1920            Vitals/Pain Today's Vitals   07/19/23 2117 07/19/23 2130 07/19/23 2245 07/19/23 2251  BP:  118/64  (!) 113/59  Pulse:  90  82  Resp:  20  19  Temp:   99.6 F (37.6 C)   TempSrc:      SpO2:  95%  95%  PainSc: 5        Isolation Precautions No active isolations  Medications Medications  lactated ringers infusion (has no administration in time range)  cefTRIAXone (ROCEPHIN) 2 g in sodium chloride 0.9 % 100 mL IVPB (0 g Intravenous Stopped 07/19/23 2109)  acetaminophen (TYLENOL) tablet 1,000 mg (1,000 mg Oral Given 07/19/23 1928)  lactated ringers bolus 1,000 mL (1,000 mLs Intravenous New Bag/Given 07/19/23 2014)    Mobility walks  with person assist     Focused Assessments     R Recommendations: See Admitting Provider Note  Report given to:   Additional Notes:

## 2023-07-20 DIAGNOSIS — B952 Enterococcus as the cause of diseases classified elsewhere: Secondary | ICD-10-CM | POA: Diagnosis not present

## 2023-07-20 DIAGNOSIS — A419 Sepsis, unspecified organism: Secondary | ICD-10-CM | POA: Diagnosis not present

## 2023-07-20 DIAGNOSIS — N1831 Chronic kidney disease, stage 3a: Secondary | ICD-10-CM

## 2023-07-20 DIAGNOSIS — A498 Other bacterial infections of unspecified site: Secondary | ICD-10-CM | POA: Insufficient documentation

## 2023-07-20 DIAGNOSIS — R300 Dysuria: Secondary | ICD-10-CM | POA: Insufficient documentation

## 2023-07-20 DIAGNOSIS — G4733 Obstructive sleep apnea (adult) (pediatric): Secondary | ICD-10-CM

## 2023-07-20 DIAGNOSIS — R7881 Bacteremia: Secondary | ICD-10-CM | POA: Diagnosis not present

## 2023-07-20 LAB — MENINGITIS/ENCEPHALITIS PANEL (CSF)

## 2023-07-20 LAB — BASIC METABOLIC PANEL
Anion gap: 8 (ref 5–15)
BUN: 23 mg/dL (ref 8–23)
CO2: 20 mmol/L — ABNORMAL LOW (ref 22–32)
Calcium: 8.8 mg/dL — ABNORMAL LOW (ref 8.9–10.3)
Chloride: 107 mmol/L (ref 98–111)
Creatinine, Ser: 1.34 mg/dL — ABNORMAL HIGH (ref 0.61–1.24)
GFR, Estimated: 55 mL/min — ABNORMAL LOW (ref >60)
Glucose, Bld: 103 mg/dL — ABNORMAL HIGH (ref 70–99)
Potassium: 3.5 mmol/L (ref 3.5–5.1)
Sodium: 135 mmol/L (ref 135–145)

## 2023-07-20 LAB — BLOOD CULTURE ID PANEL (REFLEXED) - BCID2

## 2023-07-20 LAB — CBC
HCT: 35 % — ABNORMAL LOW (ref 39.0–52.0)
Hemoglobin: 11.1 g/dL — ABNORMAL LOW (ref 13.0–17.0)
MCH: 32.4 pg (ref 26.0–34.0)
MCHC: 31.7 g/dL (ref 30.0–36.0)
MCV: 102 fL — ABNORMAL HIGH (ref 80.0–100.0)
Platelets: 155 10*3/uL (ref 150–400)
RBC: 3.43 MIL/uL — ABNORMAL LOW (ref 4.22–5.81)
RDW: 14 % (ref 11.5–15.5)
WBC: 16.2 10*3/uL — ABNORMAL HIGH (ref 4.0–10.5)
nRBC: 0 % (ref 0.0–0.2)

## 2023-07-20 LAB — CSF CELL COUNT WITH DIFFERENTIAL
RBC Count, CSF: 3 /mm3 — ABNORMAL HIGH
Tube #: 4
WBC, CSF: 2 /mm3 (ref 0–5)

## 2023-07-20 LAB — GLUCOSE, CAPILLARY: Glucose-Capillary: 124 mg/dL — ABNORMAL HIGH (ref 70–99)

## 2023-07-20 MED ORDER — SODIUM CHLORIDE 0.9 % IV SOLN
3.0000 g | Freq: Four times a day (QID) | INTRAVENOUS | Status: DC
Start: 2023-07-20 — End: 2023-07-22
  Administered 2023-07-20 – 2023-07-22 (×8): 3 g via INTRAVENOUS
  Filled 2023-07-20 (×9): qty 8

## 2023-07-20 MED ORDER — VANCOMYCIN HCL 1250 MG/250ML IV SOLN
1250.0000 mg | INTRAVENOUS | Status: DC
Start: 2023-07-21 — End: 2023-07-20

## 2023-07-20 MED ORDER — ENOXAPARIN SODIUM 40 MG/0.4ML IJ SOSY
40.0000 mg | PREFILLED_SYRINGE | INTRAMUSCULAR | Status: DC
  Administered 2023-07-20 – 2023-07-23 (×4): 40 mg via SUBCUTANEOUS
  Filled 2023-07-20 (×4): qty 0.4

## 2023-07-20 MED ORDER — VANCOMYCIN HCL 1.5 G IV SOLR
1500.0000 mg | INTRAVENOUS | Status: DC
  Administered 2023-07-20: 1500 mg via INTRAVENOUS
  Filled 2023-07-20: qty 30

## 2023-07-20 NOTE — Progress Notes (Signed)
Mobility Specialist - Progress Note   07/20/23 1117  Mobility  Activity Transferred to/from [facility]  Level of Assistance Minimal assist, patient does 75% or more  Assistive Device Front wheel walker;BSC  Range of Motion/Exercises Active  Activity Response Tolerated well  Mobility Referral Yes  Mobility visit 1 Mobility  Mobility Specialist Start Time (ACUTE ONLY) 1106  Mobility Specialist Stop Time (ACUTE ONLY) 1117  Mobility Specialist Time Calculation (min) (ACUTE ONLY) 11 min   Received in bed requesting assistance to restroom. Opted for BSC, MIN A for bed mobility and STS. Shuffled side steps to commode. Returned to bed with all needs met.   Marilynne Halsted Mobility Specialist

## 2023-07-20 NOTE — Consult Note (Signed)
Date of Admission:  07/19/2023          Reason for Consult: Enterococcus faecalis bacteremia with sepsis    Referring Provider: CHAMP [facility]ult and Calvert Cantor, MD   Assessment:  E. Faecalis bacteremia with no clear source, rule out endocarditis with sepsis Recent fall Chronic diarrhea Hx of prostate cancer sp surgery and XRT OSA CKD Memory loss CKD Hx of shoulder surgery bilateral shoulders Right upper arm pan of unclear cause  Plan:  Change antibiotics to IV unasyn for now 2 D echocardiogram and will need a TEE Repeat blood cultures tomorrow morning Consider CT abdomen and pelvis in coming days to ensure no intra-abdominal source of infection Screen for HIV and viral hepatitides (in low risk patient)   Principal Problem:   Enterococcus faecalis infection Active Problems:   Sleep apnea   Chronic kidney disease (CKD), stage III (moderate) (HCC)   Sepsis (HCC)   Bacteremia   Dysuria   Scheduled Meds:  buPROPion  150 mg Oral Daily   citalopram  20 mg Oral Daily   enoxaparin (LOVENOX) injection  40 mg Subcutaneous Q24H   mirabegron ER  25 mg Oral Daily   pantoprazole  40 mg Oral Daily   Continuous Infusions:  ampicillin-sulbactam (UNASYN) IV     PRN Meds:.acetaminophen **OR** acetaminophen, ondansetron **OR** ondansetron (ZOFRAN) IV, sorbitol  HPI: Luis Villegas is a 77 y.o. male with past medical history significant for prostate cancer status post surgery and radiation, chronic kidney disease chronic loose bowel movements, memory loss sleep apnea who been found on the floor at home by his wife.  He was awake and alert but completely unable to get up.  He remembered falling and did not believe that he passed out prior to falling he had had progressive worsening weakness over the days prior to admission in the ER he was febrile over 102 degrees.  He had headache.  UA was negative though he told me he had had some dysuria his UA did not  show any pyuria chest x-ray was on showed no acute cardiopulmonary pathology COVID and RSV and influenza PCR's were negative blood cultures were taken he was started on ceftriaxone.  He underwent lumbar puncture which showed CSF with only 2 white blood cells and 3 red blood cells with a protein of 34 and glucose of 71.  Meningitis encephalitis panel was negative.  Blood cultures of subsequently turned positive for Enterococcus faecalis.  I have changed his antibiotics to unasyn. I will repeat blood cultures in the am.  TTE ordered and he may need TEE  Will need to also set her a CT of the abdomen in the coming days to look for potential occult infection of the abdomen where he does have some diffuse tenderness.  I have personally spent 84 minutes involved in face-to-face and non-face-to-face activities for this patient on the day of the visit. Professional time spent includes the following activities: Preparing to see the patient (review of tests), Obtaining and/or reviewing separately obtained history (admission/discharge record), Performing a medically appropriate examination and/or evaluation , Ordering medications/tests/procedures, referring and communicating with other health care professionals, Documenting clinical information in the EMR, Independently interpreting results (not separately reported), Communicating results to the patient/family/caregiver, Counseling and educating the patient/family/caregiver and Care coordination (not separately reported).     Review of Systems: Review of Systems  Unable to perform ROS: Critical illness    Past Medical History:  Diagnosis Date   Allergy  Colon polyps 2016   Depression    GERD (gastroesophageal reflux disease)    Heart murmur    Hyperlipidemia    IBS (irritable bowel syndrome)    Kidney stones    stage 3    Memory loss    Prostate cancer (HCC) 2003   sx 2003 and radation   Sleep apnea    no cpap per pt   Sleep apnea with use  of continuous positive airway pressure (CPAP)     Social History   Tobacco Use   Smoking status: Former    Current packs/day: 0.00    Types: Cigarettes    Quit date: 08/04/1993    Years since quitting: 29.9   Smokeless tobacco: Never  Vaping Use   Vaping status: Never Used  Substance Use Topics   Alcohol use: No   Drug use: No    Family History  Problem Relation Age of Onset   Stroke Father 31   Bladder Cancer Father    Breast cancer Mother    Diabetes Mother    Neuropathy Mother    Valvular heart disease Mother    Heart attack Mother 30   Prostate cancer Brother    Heart disease Brother    Heart attack Brother    Prostate cancer Brother    Colon cancer Neg Hx    Colon polyps Neg Hx    Esophageal cancer Neg Hx    Stomach cancer Neg Hx    Rectal cancer Neg Hx    No Active Allergies  OBJECTIVE: Blood pressure (!) 121/57, pulse 90, temperature (!) 101.1 F (38.4 C), temperature source Oral, resp. rate 18, height 5\' 8"  (1.727 m), weight 75.8 kg, SpO2 96%.  Physical Exam HENT:     Head: Normocephalic and atraumatic.  Eyes:     General:        Right eye: No discharge.        Left eye: No discharge.     Conjunctiva/sclera: Conjunctivae normal.  Cardiovascular:     Rate and Rhythm: Normal rate.  Pulmonary:     Effort: Pulmonary effort is normal. No respiratory distress.     Breath sounds: No wheezing.  Abdominal:     General: There is no distension.     Tenderness: There is abdominal tenderness. There is no guarding or rebound.  Musculoskeletal:     Right shoulder: Decreased range of motion.     Left shoulder: Decreased range of motion.     Cervical back: Normal range of motion.  Skin:    General: Skin is warm and dry.     Coloration: Skin is pale. Skin is not jaundiced.     Findings: No bruising or lesion.  Neurological:     General: No focal deficit present.     Mental Status: He is oriented to person, place, and time.  Psychiatric:        Attention and  Perception: He is inattentive.        Speech: Speech normal.        Behavior: Behavior normal.        Thought Content: Thought content normal.        Cognition and Memory: Memory is impaired. He exhibits impaired recent memory.     Lab Results Lab Results  Component Value Date   WBC 16.2 (H) 07/20/2023   HGB 11.1 (L) 07/20/2023   HCT 35.0 (L) 07/20/2023   MCV 102.0 (H) 07/20/2023   PLT 155 07/20/2023    Lab  Results  Component Value Date   CREATININE 1.34 (H) 07/20/2023   BUN 23 07/20/2023   NA 135 07/20/2023   K 3.5 07/20/2023   CL 107 07/20/2023   CO2 20 (L) 07/20/2023    Lab Results  Component Value Date   ALT 12 09/04/2022   AST 17 09/04/2022   ALKPHOS 47 09/04/2022   BILITOT 0.2 (L) 09/04/2022     Microbiology: Recent Results (from the past 240 hours)  Blood Culture (routine x 2)     Status: None (Preliminary result)   Collection Time: 07/19/23  7:21 PM   Specimen: Right Antecubital; Blood  Result Value Ref Range Status   Specimen Description   Final    RIGHT ANTECUBITAL BOTTLES DRAWN AEROBIC AND ANAEROBIC Performed at [facility], [address], [place], [place] [address]    Special Requests   Final    Blood Culture adequate volume Performed at [facility], [address] [place], [place] [address]    Culture  Setup Time   Final    GRAM POSITIVE COCCI IN BOTH AEROBIC AND ANAEROBIC BOTTLES CRITICAL RESULT CALLED TO, READ BACK BY AND VERIFIED WITH: PHARMD D.WOFFORD AT 07/20/2023 BY T.SAAD. Performed at [facility], [address] [place], [place] [address]    Culture [facility] POSITIVE COCCI  Final   Report Status PENDING  Incomplete  Blood Culture ID Panel (Reflexed)     Status: Abnormal   Collection Time: 07/19/23  7:21 PM  Result Value Ref Range Status   Enterococcus faecalis DETECTED (A) NOT DETECTED Final    Comment: CRITICAL RESULT CALLED TO, READ BACK BY AND VERIFIED WITH: PHARMD D.WOFFORD AT  07/20/2023 BY T.SAAD.    Enterococcus Faecium NOT DETECTED NOT DETECTED Final   Listeria monocytogenes NOT DETECTED NOT DETECTED Final   Staphylococcus species NOT DETECTED NOT DETECTED Final   Staphylococcus aureus (BCID) NOT DETECTED NOT DETECTED Final   Staphylococcus epidermidis NOT DETECTED NOT DETECTED Final   Staphylococcus lugdunensis NOT DETECTED NOT DETECTED Final   Streptococcus species NOT DETECTED NOT DETECTED Final   Streptococcus agalactiae NOT DETECTED NOT DETECTED Final   Streptococcus pneumoniae NOT DETECTED NOT DETECTED Final   Streptococcus pyogenes NOT DETECTED NOT DETECTED Final   A.calcoaceticus-baumannii NOT DETECTED NOT DETECTED Final   Bacteroides fragilis NOT DETECTED NOT DETECTED Final   Enterobacterales NOT DETECTED NOT DETECTED Final   Enterobacter cloacae complex NOT DETECTED NOT DETECTED Final   Escherichia coli NOT DETECTED NOT DETECTED Final   Klebsiella aerogenes NOT DETECTED NOT DETECTED Final   Klebsiella oxytoca NOT DETECTED NOT DETECTED Final   Klebsiella pneumoniae NOT DETECTED NOT DETECTED Final   Proteus species NOT DETECTED NOT DETECTED Final   Salmonella species NOT DETECTED NOT DETECTED Final   Serratia marcescens NOT DETECTED NOT DETECTED Final   Haemophilus influenzae NOT DETECTED NOT DETECTED Final   Neisseria meningitidis NOT DETECTED NOT DETECTED Final   Pseudomonas aeruginosa NOT DETECTED NOT DETECTED Final   Stenotrophomonas maltophilia NOT DETECTED NOT DETECTED Final   Candida albicans NOT DETECTED NOT DETECTED Final   Candida auris NOT DETECTED NOT DETECTED Final   Candida glabrata NOT DETECTED NOT DETECTED Final   Candida krusei NOT DETECTED NOT DETECTED Final   Candida parapsilosis NOT DETECTED NOT DETECTED Final   Candida tropicalis NOT DETECTED NOT DETECTED Final   Cryptococcus neoformans/gattii NOT DETECTED NOT DETECTED Final   Vancomycin resistance NOT DETECTED NOT DETECTED Final    Comment: Performed at [facility], [address], [place], [place] [address]  Resp panel by RT-PCR (RSV, Flu A&B, Covid) Anterior Nasal Swab     Status: None   Collection Time: 07/19/23  7:43 PM   Specimen: Anterior Nasal Swab  Result Value Ref Range Status   SARS Coronavirus 2 by RT PCR NEGATIVE NEGATIVE Final    Comment: (NOTE) SARS-CoV-2 target nucleic acids are NOT DETECTED.  The SARS-CoV-2 RNA is generally detectable in upper respiratory specimens during the acute phase of infection. The lowest concentration of SARS-CoV-2 viral copies this assay can detect is 138 copies/mL. A negative result does not preclude SARS-Cov-2 infection and should not be used as the sole basis for treatment or other patient management decisions. A negative result may occur with  improper specimen collection/handling, submission of specimen other than nasopharyngeal swab, presence of viral mutation(s) within the areas targeted by this assay, and inadequate number of viral copies(<138 copies/mL). A negative result must be combined with clinical observations, patient history, and epidemiological information. The expected result is Negative.  Fact Sheet for Patients:  BloggerCourse.com  Fact Sheet for Healthcare Providers:  SeriousBroker.it  This test is no t yet approved or cleared by the [place] FDA and  has been authorized for detection and/or diagnosis of SARS-CoV-2 by FDA under an Emergency Use Authorization (EUA). This EUA will remain  in effect (meaning this test can be used) for the duration of the COVID-19 declaration under Section 564(b)(1) of the Act, 21 U.S.C.section 360bbb-3(b)(1), unless the authorization is terminated  or revoked sooner.       Influenza A by PCR NEGATIVE NEGATIVE Final   Influenza B by PCR NEGATIVE NEGATIVE Final    Comment: (NOTE) The Xpert Xpress SARS-CoV-2/FLU/RSV plus assay is intended as an aid in the diagnosis of influenza from  Nasopharyngeal swab specimens and should not be used as a sole basis for treatment. Nasal washings and aspirates are unacceptable for Xpert Xpress SARS-CoV-2/FLU/RSV testing.  Fact Sheet for Patients: BloggerCourse.com  Fact Sheet for Healthcare Providers: SeriousBroker.it  This test is not yet approved or cleared by the [place] FDA and has been authorized for detection and/or diagnosis of SARS-CoV-2 by FDA under an Emergency Use Authorization (EUA). This EUA will remain in effect (meaning this test can be used) for the duration of the COVID-19 declaration under Section 564(b)(1) of the Act, 21 U.S.C. section 360bbb-3(b)(1), unless the authorization is terminated or revoked.     Resp Syncytial Virus by PCR NEGATIVE NEGATIVE Final    Comment: (NOTE) Fact Sheet for Patients: BloggerCourse.com  Fact Sheet for Healthcare Providers: SeriousBroker.it  This test is not yet approved or cleared by the [place] FDA and has been authorized for detection and/or diagnosis of SARS-CoV-2 by FDA under an Emergency Use Authorization (EUA). This EUA will remain in effect (meaning this test can be used) for the duration of the COVID-19 declaration under Section 564(b)(1) of the Act, 21 U.S.C. section 360bbb-3(b)(1), unless the authorization is terminated or revoked.  Performed at [facility], [address] [place], [place] [address]   Blood Culture (routine x 2)     Status: None (Preliminary result)   Collection Time: 07/19/23  8:00 PM   Specimen: BLOOD RIGHT ARM  Result Value Ref Range Status   Specimen Description   Final    BLOOD RIGHT ARM BOTTLES DRAWN AEROBIC AND ANAEROBIC Performed at [facility], [address] [place], [place] [address]    Special Requests  Final    Blood Culture results may not be optimal due to an inadequate  volume of blood received in culture bottles Performed at [facility], [address], [place], [place] [address]    Culture  Setup Time   Final    GRAM POSITIVE COCCI AEROBIC BOTTLE ONLY CRITICAL VALUE NOTED.  VALUE IS CONSISTENT WITH PREVIOUSLY REPORTED AND CALLED VALUE. Performed at [facility], [name], [place], [place] [address]    Culture GRAM POSITIVE COCCI  Final   Report Status PENDING  Incomplete  CSF culture w Gram Stain     Status: None (Preliminary result)   Collection Time: 07/19/23 10:39 PM   Specimen: CSF; Cerebrospinal Fluid  Result Value Ref Range Status   Specimen Description   Final    CSF Performed at [facility], [address] [place], [place] [address]    Special Requests   Final    CSF Performed at [facility], [address] [place], [place] [address]    Gram Stain   Final    NO WBC SEEN NO ORGANISMS SEEN CYTOSPIN SMEAR Performed at [facility], [address] [place], [place] [address]    Culture PENDING  Incomplete   Report Status PENDING  Incomplete    Acey Lav, MD [facility] for Infectious Disease [facility] [phone] pager  07/20/2023, 4:00 PM

## 2023-07-20 NOTE — Progress Notes (Signed)
[facility] Progress Note  Patient: Luis Villegas     VFI:[id number]  DOA: 07/19/2023   PCP: Deeann Saint, MD       Brief hospital course: This is a 77 year old male with CKD, chronic diarrhea who fell at home.  His wife was unable to get him up and therefore called EMS and he was brought to the hospital.  His temperature was noted to be 102.2 degrees in the ED.  The patient and his wife had not noted any fevers at home.  He had not had any cough sore throat or runny nose.  He had no GI symptoms.  His wife did note that he complained of burning micturition.  He has never had a urinary tract infection in the past. In the ED, COVID, RSV and influenza test were negative.  Chest x-ray and UA were unremarkable Due to his confusion, an LP was performed.  Subjective:  The patient is alert and is aware that he is in the hospital but cannot really recall the events of yesterday quite well.  He does not have any complaints.  He currently has a fever but does not feel feverish.  Assessment and Plan: Principal Problem:   Enterococcus faecalis infection, severe sepsis -LP was not consistent with an underlying infection - He has ongoing fevers today -The source is not immediately obvious - Unasyn has been started and an ID consult has been performed - ID has recommended a TEE, repeat blood cultures tomorrow and would like to consider CT abdomen and pelvis - Hepatitis and HIV serologies ordered  Active Problems: Acute metabolic encephalopathy - Likely secondary to above-mentioned infection - Mentation appears to have returned back to normal today  History of prostate cancer - status post prostatectomy and XRT  History of chronic diarrhea - He takes half a tab of Imodium daily-since he has not had any diarrhea in the hospital, will hold this and follow   Chronic kidney disease stage IIIa    Code Status: Full Code Total time on patient care: 35 minutes DVT prophylaxis:   enoxaparin (LOVENOX) injection 40 mg Start: 07/20/23 2000     Objective:   Vitals:   07/20/23 0026 07/20/23 0437 07/20/23 0510 07/20/23 0900  BP: 134/68 129/65  (!) 121/57  Pulse: 93 90  90  Resp: 18 18    Temp: 98.8 F (37.1 C) (!) 100.4 F (38 C)  (!) 101.1 F (38.4 C)  TempSrc: Oral Oral  Oral  SpO2: 97% (!) 88% 96% 96%  Weight: 75.8 kg     Height: 5\' 8"  (1.727 m)      Filed Weights   07/19/23 2323 07/20/23 0026  Weight: 73 kg 75.8 kg   Exam: General exam: Appears comfortable  HEENT: oral mucosa moist Respiratory system: Clear to auscultation.  Cardiovascular system: S1 & S2 heard  Gastrointestinal system: Abdomen soft, non-tender, nondistended. Normal bowel sounds   Extremities: No cyanosis, clubbing or edema Psychiatry:  Mood & affect appropriate.      CBC: Recent Labs  Lab 07/19/23 1916 07/20/23 0527  WBC 13.3* 16.2*  NEUTROABS 11.1*  --   HGB 12.2* 11.1*  HCT 38.2* 35.0*  MCV 101.1* 102.0*  PLT 182 155   Basic Metabolic Panel: Recent Labs  Lab 07/19/23 1916 07/20/23 0527  NA 136 135  K 3.8 3.5  CL 106 107  CO2 24 20*  GLUCOSE 100* 103*  BUN 27* 23  CREATININE 1.49* 1.34*  CALCIUM 9.2 8.8*  Scheduled Meds:  buPROPion  150 mg Oral Daily   citalopram  20 mg Oral Daily   enoxaparin (LOVENOX) injection  40 mg Subcutaneous Q24H   mirabegron ER  25 mg Oral Daily   pantoprazole  40 mg Oral Daily    Imaging and lab data personally reviewed   Author: Calvert Cantor  07/20/2023 4:09 PM  To contact [facility]>   Check the care team in [facility] and look for the attending/consulting [facility] provider listed  Log into [email or web address] and use [facility]'s universal password   Go to> "[facility]"  and find provider  If you still have difficulty reaching the provider, please page the [facility] (Director on Call) for the Hospitalists listed on amion

## 2023-07-20 NOTE — Progress Notes (Signed)
Pharmacy Antibiotic Note  Luis Villegas is a 77 y.o. male admitted on 07/19/2023 with bacteremia.  Pharmacy has been consulted for Vancomycin dosing.  Active Problem(s): Fall, AMS, Temp 102.2 - LP performed  PMH: CKD, sleep apnea but no CPAP, and chronic mild diarrhea, Depression, GERD, HLD, IBS, momory loss, prostate cancer, OSA,   ID: Bacteremia, r/o meningitis - UA ok except for rare bacteria - CSF: Clear, 71 glucose, 3 RBC - Vitals: Tmax 102.2>>101.1, WBC 13.3 up to 16.2, Scr 1.34, - LA 2>1.2  12/15: BC x 2: GPC in 3/4 bottles. 12/15: CSF culture>>  12/15: Rocephin>>(7d) 12/16: Vanco>>  Plan: Rocephin 2g IV q24h x 7d Vanco 1500mg  IV x 1 Vancomycin 1250 mg IV Q 24 hrs. Goal AUC 400-550. Expected AUC: 508 SCr used: 1.34    Height: 5\' 8"  (172.7 cm) Weight: 75.8 kg (167 lb 1.7 oz) IBW/kg (Calculated) : 68.4  Temp (24hrs), Avg:100.3 F (37.9 C), Min:98.8 F (37.1 C), Max:102.2 F (39 C)  Recent Labs  Lab 07/19/23 1916 07/19/23 1930 07/19/23 2136 07/20/23 0527  WBC 13.3*  --   --  16.2*  CREATININE 1.49*  --   --  1.34*  LATICACIDVEN  --  2.0* 1.2  --     Estimated Creatinine Clearance: 45.4 mL/min (A) (by C-G formula based on SCr of 1.34 mg/dL (H)).    No Active Allergies  [name] S. Merilynn Finland, PharmD, BCPS Clinical Staff Pharmacist [email or web address]  [name] 07/20/2023 2:05 PM

## 2023-07-20 NOTE — Plan of Care (Signed)

## 2023-07-20 NOTE — Progress Notes (Signed)
PHARMACY - PHYSICIAN COMMUNICATION CRITICAL VALUE ALERT - BLOOD CULTURE IDENTIFICATION (BCID)  Luis Villegas is an 77 y.o. male who presented to [facility] on 07/19/2023 with a chief complaint of Fall, AMS, Temp 102.2 .  Assessment:  3/4 enterococcus faecalis - no vanc resistance.   Name of physician (or Provider) Contacted: Rizwan  Current antibiotics: Rocephin  Changes to prescribed antibiotics recommended:  Recommendations accepted by provider. Start Vancomycin  Results for orders placed or performed during the hospital encounter of 07/19/23  Blood Culture ID Panel (Reflexed) (Collected: 07/19/2023  7:21 PM)  Result Value Ref Range   Enterococcus faecalis DETECTED (A) NOT DETECTED   Enterococcus Faecium NOT DETECTED NOT DETECTED   Listeria monocytogenes NOT DETECTED NOT DETECTED   Staphylococcus species NOT DETECTED NOT DETECTED   Staphylococcus aureus (BCID) NOT DETECTED NOT DETECTED   Staphylococcus epidermidis NOT DETECTED NOT DETECTED   Staphylococcus lugdunensis NOT DETECTED NOT DETECTED   Streptococcus species NOT DETECTED NOT DETECTED   Streptococcus agalactiae NOT DETECTED NOT DETECTED   Streptococcus pneumoniae NOT DETECTED NOT DETECTED   Streptococcus pyogenes NOT DETECTED NOT DETECTED   A.calcoaceticus-baumannii NOT DETECTED NOT DETECTED   Bacteroides fragilis NOT DETECTED NOT DETECTED   Enterobacterales NOT DETECTED NOT DETECTED   Enterobacter cloacae complex NOT DETECTED NOT DETECTED   Escherichia coli NOT DETECTED NOT DETECTED   Klebsiella aerogenes NOT DETECTED NOT DETECTED   Klebsiella oxytoca NOT DETECTED NOT DETECTED   Klebsiella pneumoniae NOT DETECTED NOT DETECTED   Proteus species NOT DETECTED NOT DETECTED   Salmonella species NOT DETECTED NOT DETECTED   Serratia marcescens NOT DETECTED NOT DETECTED   Haemophilus influenzae NOT DETECTED NOT DETECTED   Neisseria meningitidis NOT DETECTED NOT DETECTED   Pseudomonas aeruginosa NOT DETECTED NOT  DETECTED   Stenotrophomonas maltophilia NOT DETECTED NOT DETECTED   Candida albicans NOT DETECTED NOT DETECTED   Candida auris NOT DETECTED NOT DETECTED   Candida glabrata NOT DETECTED NOT DETECTED   Candida krusei NOT DETECTED NOT DETECTED   Candida parapsilosis NOT DETECTED NOT DETECTED   Candida tropicalis NOT DETECTED NOT DETECTED   Cryptococcus neoformans/gattii NOT DETECTED NOT DETECTED   Vancomycin resistance NOT DETECTED NOT DETECTED   Kalyn Dimattia S. Merilynn Finland, PharmD, BCPS Clinical Staff Pharmacist [email or web address] [name] 07/20/2023  2:34 PM

## 2023-07-20 NOTE — Progress Notes (Addendum)
Pt calm resting in bed  no acute distress noted   safety measures remain in place    call bell within reach  all poc relayed to oncoming Rn  completed handoff at bedside with  Rn

## 2023-07-20 NOTE — Plan of Care (Signed)

## 2023-07-20 NOTE — H&P (Signed)
History and Physical    Patient: Luis Villegas WJX:[id number] DOB: 1945-09-11 DOA: 07/19/2023 DOS: the patient was seen and examined on 07/20/2023 PCP: Deeann Saint, MD  Patient coming from: Home  Chief Complaint:  Chief Complaint  Patient presents with   Fall   HPI: Luis Villegas is a 77 y.o. male with medical history significant for CKD, sleep apnea but no CPAP, and chronic diarrhea who presents today after a fall.  The patient's wife found him on the floor at home.  He was awake and alert but could not get up.  She says he remembers falling so there is no evidence of loss of consciousness.  She brought him in for evaluation because he was not himself.  He was a little confused.  On arrival in the emergency department the patient was noted to have a temperature of 102.2.  The patient said he was not aware of having any fevers recently.  He does report diminished appetite he does have mild chronic diarrhea.  He has also had headache.  He denies any travel or unusual activity.  He denies any sick contacts.  His workup in the emergency department included a UA and a chest x-ray both of which were unremarkable.  His COVID, RSV, and flu tests were negative.  With no clear source of the fever the patient did have an LP done in the emergency department.  The LP results are still pending at this time. The patient does have history of memory loss listed in his chart but the wife reports that he is alert and oriented and fully functional usually.  Review of Systems: As mentioned in the history of present illness. All other systems reviewed and are negative. Past Medical History:  Diagnosis Date   Allergy    Colon polyps 2016   Depression    GERD (gastroesophageal reflux disease)    Heart murmur    Hyperlipidemia    IBS (irritable bowel syndrome)    Kidney stones    stage 3    Memory loss    Prostate cancer (HCC) 2003   sx 2003 and radation   Sleep apnea    no cpap  per pt   Sleep apnea with use of continuous positive airway pressure (CPAP)    Past Surgical History:  Procedure Laterality Date   CARPAL TUNNEL RELEASE Bilateral    CERVICAL FUSION     COLONOSCOPY  2007, 08/09/2014   [facility]/ 2016 [place]   COLONOSCOPY W/ POLYPECTOMY  2002   POLYPECTOMY  2016   polyps x3 T.A.   PROSTATECTOMY  2003   Dr.Davis   TONSILLECTOMY     Social History:  reports that he quit smoking about 29 years ago. His smoking use included cigarettes. He has never used smokeless tobacco. He reports that he does not drink alcohol and does not use drugs.  No Active Allergies  Family History  Problem Relation Age of Onset   Stroke Father 6   Bladder Cancer Father    Breast cancer Mother    Diabetes Mother    Neuropathy Mother    Valvular heart disease Mother    Heart attack Mother 26   Prostate cancer Brother    Heart disease Brother    Heart attack Brother    Prostate cancer Brother    Colon cancer Neg Hx    Colon polyps Neg Hx    Esophageal cancer Neg Hx    Stomach cancer Neg Hx  Rectal cancer Neg Hx     Prior to Admission medications   Medication Sig Start Date End Date Taking? Authorizing Provider  acyclovir (ZOVIRAX) 200 MG capsule TAKE 1 CAPSULE(200 MG) BY MOUTH DAILY Patient taking differently: Take 200 mg by mouth daily as needed. 12/31/22  Yes Deeann Saint, MD  amoxicillin (AMOXIL) 500 MG capsule Take 500 mg by mouth 3 (three) times daily. 06/22/23  Yes [provider]  azelastine (OPTIVAR) 0.05 % ophthalmic solution Apply 1 drop to eye 2 (two) times daily as needed. 08/26/22  Yes [provider]  buPROPion (WELLBUTRIN XL) 150 MG 24 hr tablet TAKE 1 TABLET(150 MG) BY MOUTH DAILY 07/13/23  Yes Deeann Saint, MD  citalopram (CELEXA) 20 MG tablet TAKE 1 TABLET(20 MG) BY MOUTH DAILY 10/02/22  Yes Deeann Saint, MD  ezetimibe-simvastatin (VYTORIN) 10-40 MG tablet Take 1 tablet by mouth at bedtime. 06/16/23  Yes [provider]  loperamide (IMODIUM A-D) 2 MG tablet Use a 1/2 tablet every morning and titrate as needed. 06/11/22  Yes Zehr, Princella Pellegrini, PA-C  Multiple Vitamin (MULTIVITAMIN ADULT PO) Take by mouth.   Yes [provider]  MYRBETRIQ 25 MG TB24 tablet Take 25 mg by mouth daily. 09/16/22  Yes [provider]  omeprazole (PRILOSEC) 20 MG capsule TAKE 1 CAPSULE BY MOUTH EVERY DAY 01/02/23  Yes Deeann Saint, MD  SUMAtriptan (IMITREX) 100 MG tablet TAKE 1 TABLET BY MOUTH AS NEEDED FOR HEADACHE 07/17/23  Yes Deeann Saint, MD  tamsulosin (FLOMAX) 0.4 MG CAPS capsule Take 0.4 mg by mouth daily. 06/24/21  Yes [provider]  topiramate (TOPAMAX) 200 MG tablet TAKE 1 TABLET(200 MG) BY MOUTH AT BEDTIME 01/02/23  Yes Deeann Saint, MD  acetaminophen-codeine (TYLENOL #3) 300-30 MG tablet Take 1 tablet by mouth every 6 (six) hours as needed for moderate pain. Patient not taking: Reported on 07/19/2023 06/20/22   Julieanne Cotton, PA-C    Physical Exam: Vitals:   07/19/23 2130 07/19/23 2245 07/19/23 2251 07/19/23 2323  BP: 118/64  (!) 113/59   Pulse: 90  82   Resp: 20  19   Temp:  99.6 F (37.6 C)    TempSrc:      SpO2: 95%  95%   Weight:    73 kg   Physical Exam:  General: No acute distress, well developed, well nourished HEENT: Normocephalic, atraumatic, PERRL Cardiovascular: Normal rate and rhythm. Distal pulses intact. Pulmonary: Normal pulmonary effort, normal breath sounds Gastrointestinal: Distended abdomen, full, non-tender, normoactive bowel sounds Musculoskeletal:Normal ROM, no lower ext edema Skin: Skin is warm and dry. Neuro: No focal deficits noted, AAOx3. PSYCH: Attentive and cooperative  Data Reviewed:  Results for orders placed or performed during the hospital encounter of 07/19/23 (from the past 24 hours)  CBC with Differential/Platelet     Status: Abnormal   Collection Time: 07/19/23  7:16 PM  Result Value Ref Range   WBC 13.3 (H) 4.0 -  10.5 K/uL   RBC 3.78 (L) 4.22 - 5.81 MIL/uL   Hemoglobin 12.2 (L) 13.0 - 17.0 g/dL   HCT 40.9 (L) 81.1 - 91.4 %   MCV 101.1 (H) 80.0 - 100.0 fL   MCH 32.3 26.0 - 34.0 pg   MCHC 31.9 30.0 - 36.0 g/dL   RDW 78.2 95.6 - 21.3 %   Platelets 182 150 - 400 K/uL   nRBC 0.0 0.0 - 0.2 %   Neutrophils Relative % 84 %   Neutro  Abs 11.1 (H) 1.7 - 7.7 K/uL   Lymphocytes Relative 7 %   Lymphs Abs 1.0 0.7 - 4.0 K/uL   Monocytes Relative 9 %   Monocytes Absolute 1.2 (H) 0.1 - 1.0 K/uL   Eosinophils Relative 0 %   Eosinophils Absolute 0.0 0.0 - 0.5 K/uL   Basophils Relative 0 %   Basophils Absolute 0.0 0.0 - 0.1 K/uL   Immature Granulocytes 0 %   Abs Immature Granulocytes 0.04 0.00 - 0.07 K/uL  Basic metabolic panel     Status: Abnormal   Collection Time: 07/19/23  7:16 PM  Result Value Ref Range   Sodium 136 135 - 145 mmol/L   Potassium 3.8 3.5 - 5.1 mmol/L   Chloride 106 98 - 111 mmol/L   CO2 24 22 - 32 mmol/L   Glucose, Bld 100 (H) 70 - 99 mg/dL   BUN 27 (H) 8 - 23 mg/dL   Creatinine, Ser 1.61 (H) 0.61 - 1.24 mg/dL   Calcium 9.2 8.9 - 09.6 mg/dL   GFR, Estimated 48 (L) >60 mL/min   Anion gap 6 5 - 15  Protime-INR     Status: None   Collection Time: 07/19/23  7:16 PM  Result Value Ref Range   Prothrombin Time 13.9 11.4 - 15.2 seconds   INR 1.0 0.8 - 1.2  APTT     Status: None   Collection Time: 07/19/23  7:16 PM  Result Value Ref Range   aPTT 27 24 - 36 seconds  I-Stat Lactic Acid, ED     Status: Abnormal   Collection Time: 07/19/23  7:30 PM  Result Value Ref Range   Lactic Acid, Venous 2.0 (HH) 0.5 - 1.9 mmol/L   Comment NOTIFIED PHYSICIAN   Resp panel by RT-PCR (RSV, Flu A&B, Covid) Anterior Nasal Swab     Status: None   Collection Time: 07/19/23  7:43 PM   Specimen: Anterior Nasal Swab  Result Value Ref Range   SARS Coronavirus 2 by RT PCR NEGATIVE NEGATIVE   Influenza A by PCR NEGATIVE NEGATIVE   Influenza B by PCR NEGATIVE NEGATIVE   Resp Syncytial Virus by PCR NEGATIVE  NEGATIVE  Urinalysis, w/ Reflex to Culture (Infection Suspected) -Urine, Clean Catch     Status: Abnormal   Collection Time: 07/19/23  9:14 PM  Result Value Ref Range   Specimen Source URINE, CLEAN CATCH    Color, Urine YELLOW YELLOW   APPearance CLEAR CLEAR   Specific Gravity, Urine 1.017 1.005 - 1.030   pH 7.0 5.0 - 8.0   Glucose, UA NEGATIVE NEGATIVE mg/dL   Hgb urine dipstick NEGATIVE NEGATIVE   Bilirubin Urine NEGATIVE NEGATIVE   Ketones, ur NEGATIVE NEGATIVE mg/dL   Protein, ur 30 (A) NEGATIVE mg/dL   Nitrite NEGATIVE NEGATIVE   Leukocytes,Ua NEGATIVE NEGATIVE   RBC / HPF 6-10 0 - 5 RBC/hpf   WBC, UA 0-5 0 - 5 WBC/hpf   Bacteria, UA RARE (A) NONE SEEN   Squamous Epithelial / HPF 0-5 0 - 5 /HPF  I-Stat Lactic Acid, ED     Status: None   Collection Time: 07/19/23  9:36 PM  Result Value Ref Range   Lactic Acid, Venous 1.2 0.5 - 1.9 mmol/L  Protein and glucose, CSF     Status: Abnormal   Collection Time: 07/19/23 10:39 PM  Result Value Ref Range   Glucose, CSF 71 (H) 40 - 70 mg/dL   Total  Protein, CSF 34 15 - 45 mg/dL  Assessment and Plan: Sepsis/ source unclear - LP results pending. He denies abdominal pain but his abdomen is distended.  Consider CT of abdomen pelvis. -LP was done in the emergency department and results are pending.  Continue Rocephin 2gm q 12.  Add Vanc if CSF has wbcs. - Hold all nonessential medications   Advance Care Planning:   Code Status: Full Code the patient will be full code by default.  His mental status was not at baseline so the CODE STATUS discussion was deferred.  Consults: none  Family Communication: none  Severity of Illness: The appropriate patient status for this patient is INPATIENT. Inpatient status is judged to be reasonable and necessary in order to provide the required intensity of service to ensure the patient's safety. The patient's presenting symptoms, physical exam findings, and initial radiographic and laboratory data in  the context of their chronic comorbidities is felt to place them at high risk for further clinical deterioration. Furthermore, it is not anticipated that the patient will be medically stable for discharge from the hospital within 2 midnights of admission.   * I certify that at the point of admission it is my clinical judgment that the patient will require inpatient hospital care spanning beyond 2 midnights from the point of admission due to high intensity of service, high risk for further deterioration and high frequency of surveillance required.*  Author: Buena Irish, MD 07/20/2023 12:00 AM  For on call review [email or web address].

## 2023-07-20 NOTE — H&P (Incomplete)
History and Physical    Patient: Japheth Bonniwell ZOX:[id number] DOB: 1946-07-12 DOA: 07/19/2023 DOS: the patient was seen and examined on 07/20/2023 PCP: Deeann Saint, MD  Patient coming from: Home  Chief Complaint:  Chief Complaint  Patient presents with  . Fall   HPI: Carrol Pomplun is a 77 y.o. male with medical history significant of ***      Review of Systems: As mentioned in the history of present illness. All other systems reviewed and are negative. Past Medical History:  Diagnosis Date  . Allergy   . Colon polyps 2016  . Depression   . GERD (gastroesophageal reflux disease)   . Heart murmur   . Hyperlipidemia   . IBS (irritable bowel syndrome)   . Kidney stones    stage 3   . Memory loss   . Prostate cancer [facility]) 2003   sx 2003 and radation  . Sleep apnea    no cpap per pt  . Sleep apnea with use of continuous positive airway pressure (CPAP)    Past Surgical History:  Procedure Laterality Date  . CARPAL TUNNEL RELEASE Bilateral   . CERVICAL FUSION    . COLONOSCOPY  2007, 08/09/2014   [facility]/ 2016 [place]  . COLONOSCOPY W/ POLYPECTOMY  2002  . POLYPECTOMY  2016   polyps x3 T.A.  . PROSTATECTOMY  2003   Dr.Davis  . TONSILLECTOMY     Social History:  reports that he quit smoking about 29 years ago. His smoking use included cigarettes. He has never used smokeless tobacco. He reports that he does not drink alcohol and does not use drugs.  No Active Allergies  Family History  Problem Relation Age of Onset  . Stroke Father 89  . Bladder Cancer Father   . Breast cancer Mother   . Diabetes Mother   . Neuropathy Mother   . Valvular heart disease Mother   . Heart attack Mother 69  . Prostate cancer Brother   . Heart disease Brother   . Heart attack Brother   . Prostate cancer Brother   . Colon cancer Neg Hx   . Colon polyps Neg Hx   . Esophageal cancer Neg Hx   . Stomach cancer Neg Hx   . Rectal cancer Neg Hx      Prior to Admission medications   Medication Sig Start Date End Date Taking? Authorizing Provider  acyclovir (ZOVIRAX) 200 MG capsule TAKE 1 CAPSULE(200 MG) BY MOUTH DAILY Patient taking differently: Take 200 mg by mouth daily as needed. 12/31/22  Yes Deeann Saint, MD  amoxicillin (AMOXIL) 500 MG capsule Take 500 mg by mouth 3 (three) times daily. 06/22/23  Yes [provider]  azelastine (OPTIVAR) 0.05 % ophthalmic solution Apply 1 drop to eye 2 (two) times daily as needed. 08/26/22  Yes [provider]  buPROPion (WELLBUTRIN XL) 150 MG 24 hr tablet TAKE 1 TABLET(150 MG) BY MOUTH DAILY 07/13/23  Yes Deeann Saint, MD  citalopram (CELEXA) 20 MG tablet TAKE 1 TABLET(20 MG) BY MOUTH DAILY 10/02/22  Yes Deeann Saint, MD  ezetimibe-simvastatin (VYTORIN) 10-40 MG tablet Take 1 tablet by mouth at bedtime. 06/16/23  Yes [provider]  loperamide (IMODIUM A-D) 2 MG tablet Use a 1/2 tablet every morning and titrate as needed. 06/11/22  Yes Zehr, Princella Pellegrini, PA-C  Multiple Vitamin (MULTIVITAMIN ADULT PO) Take by mouth.   Yes [provider]  MYRBETRIQ 25 MG TB24 tablet Take 25 mg by  mouth daily. 09/16/22  Yes [provider]  omeprazole (PRILOSEC) 20 MG capsule TAKE 1 CAPSULE BY MOUTH EVERY DAY 01/02/23  Yes Deeann Saint, MD  SUMAtriptan (IMITREX) 100 MG tablet TAKE 1 TABLET BY MOUTH AS NEEDED FOR HEADACHE 07/17/23  Yes Deeann Saint, MD  tamsulosin (FLOMAX) 0.4 MG CAPS capsule Take 0.4 mg by mouth daily. 06/24/21  Yes [provider]  topiramate (TOPAMAX) 200 MG tablet TAKE 1 TABLET(200 MG) BY MOUTH AT BEDTIME 01/02/23  Yes Deeann Saint, MD  acetaminophen-codeine (TYLENOL #3) 300-30 MG tablet Take 1 tablet by mouth every 6 (six) hours as needed for moderate pain. Patient not taking: Reported on 07/19/2023 06/20/22   Julieanne Cotton, PA-C    Physical Exam: Vitals:   07/19/23 2130 07/19/23 2245 07/19/23 2251 07/19/23 2323  BP:  118/64  (!) 113/59   Pulse: 90  82   Resp: 20  19   Temp:  99.6 F (37.6 C)    TempSrc:      SpO2: 95%  95%   Weight:    73 kg   Physical Exam:  General: No acute distress, well developed, well nourished HEENT: Normocephalic, atraumatic, PERRL Cardiovascular: Normal rate and rhythm. Distal pulses intact. Pulmonary: Normal pulmonary effort, normal breath sounds Gastrointestinal: Distended abdomen, full, non-tender, normoactive bowel sounds, no organomegaly Musculoskeletal:Normal ROM, no lower ext edema Lymphadenopathy: No cervical LAD. Skin: Skin is warm and dry. Neuro: No focal deficits noted, AAOx3. PSYCH: Attentive and cooperative  Data Reviewed: {Tip this will not be part of the note when signed- Document your independent interpretation of telemetry tracing, EKG, lab, Radiology test or any other diagnostic tests. Add any new diagnostic test ordered today. (Optional):[id number]} Results for orders placed or performed during the hospital encounter of 07/19/23 (from the past 24 hours)  CBC with Differential/Platelet     Status: Abnormal   Collection Time: 07/19/23  7:16 PM  Result Value Ref Range   WBC 13.3 (H) 4.0 - 10.5 K/uL   RBC 3.78 (L) 4.22 - 5.81 MIL/uL   Hemoglobin 12.2 (L) 13.0 - 17.0 g/dL   HCT 14.7 (L) 82.9 - 56.2 %   MCV 101.1 (H) 80.0 - 100.0 fL   MCH 32.3 26.0 - 34.0 pg   MCHC 31.9 30.0 - 36.0 g/dL   RDW 13.0 86.5 - 78.4 %   Platelets 182 150 - 400 K/uL   nRBC 0.0 0.0 - 0.2 %   Neutrophils Relative % 84 %   Neutro Abs 11.1 (H) 1.7 - 7.7 K/uL   Lymphocytes Relative 7 %   Lymphs Abs 1.0 0.7 - 4.0 K/uL   Monocytes Relative 9 %   Monocytes Absolute 1.2 (H) 0.1 - 1.0 K/uL   Eosinophils Relative 0 %   Eosinophils Absolute 0.0 0.0 - 0.5 K/uL   Basophils Relative 0 %   Basophils Absolute 0.0 0.0 - 0.1 K/uL   Immature Granulocytes 0 %   Abs Immature Granulocytes 0.04 0.00 - 0.07 K/uL  Basic metabolic panel     Status: Abnormal   Collection Time: 07/19/23  7:16 PM   Result Value Ref Range   Sodium 136 135 - 145 mmol/L   Potassium 3.8 3.5 - 5.1 mmol/L   Chloride 106 98 - 111 mmol/L   CO2 24 22 - 32 mmol/L   Glucose, Bld 100 (H) 70 - 99 mg/dL   BUN 27 (H) 8 - 23 mg/dL   Creatinine, Ser 6.96 (H) 0.61 - 1.24 mg/dL  Calcium 9.2 8.9 - 10.3 mg/dL   GFR, Estimated 48 (L) >60 mL/min   Anion gap 6 5 - 15  Protime-INR     Status: None   Collection Time: 07/19/23  7:16 PM  Result Value Ref Range   Prothrombin Time 13.9 11.4 - 15.2 seconds   INR 1.0 0.8 - 1.2  APTT     Status: None   Collection Time: 07/19/23  7:16 PM  Result Value Ref Range   aPTT 27 24 - 36 seconds  I-Stat Lactic Acid, ED     Status: Abnormal   Collection Time: 07/19/23  7:30 PM  Result Value Ref Range   Lactic Acid, Venous 2.0 (HH) 0.5 - 1.9 mmol/L   Comment NOTIFIED PHYSICIAN   Resp panel by RT-PCR (RSV, Flu A&B, Covid) Anterior Nasal Swab     Status: None   Collection Time: 07/19/23  7:43 PM   Specimen: Anterior Nasal Swab  Result Value Ref Range   SARS Coronavirus 2 by RT PCR NEGATIVE NEGATIVE   Influenza A by PCR NEGATIVE NEGATIVE   Influenza B by PCR NEGATIVE NEGATIVE   Resp Syncytial Virus by PCR NEGATIVE NEGATIVE  Urinalysis, w/ Reflex to Culture (Infection Suspected) -Urine, Clean Catch     Status: Abnormal   Collection Time: 07/19/23  9:14 PM  Result Value Ref Range   Specimen Source URINE, CLEAN CATCH    Color, Urine YELLOW YELLOW   APPearance CLEAR CLEAR   Specific Gravity, Urine 1.017 1.005 - 1.030   pH 7.0 5.0 - 8.0   Glucose, UA NEGATIVE NEGATIVE mg/dL   Hgb urine dipstick NEGATIVE NEGATIVE   Bilirubin Urine NEGATIVE NEGATIVE   Ketones, ur NEGATIVE NEGATIVE mg/dL   Protein, ur 30 (A) NEGATIVE mg/dL   Nitrite NEGATIVE NEGATIVE   Leukocytes,Ua NEGATIVE NEGATIVE   RBC / HPF 6-10 0 - 5 RBC/hpf   WBC, UA 0-5 0 - 5 WBC/hpf   Bacteria, UA RARE (A) NONE SEEN   Squamous Epithelial / HPF 0-5 0 - 5 /HPF  I-Stat Lactic Acid, ED     Status: None   Collection Time:  07/19/23  9:36 PM  Result Value Ref Range   Lactic Acid, Venous 1.2 0.5 - 1.9 mmol/L  Protein and glucose, CSF     Status: Abnormal   Collection Time: 07/19/23 10:39 PM  Result Value Ref Range   Glucose, CSF 71 (H) 40 - 70 mg/dL   Total  Protein, CSF 34 15 - 45 mg/dL     Assessment and Plan: No notes have been filed under this hospital service. Service: Hospitalist     Advance Care Planning:   Code Status: Full Code the patient will be full code by default.  His mental status was not at baseline so the CODE STATUS discussion was deferred.  Consults: none  Family Communication: none  Severity of Illness: The appropriate patient status for this patient is INPATIENT. Inpatient status is judged to be reasonable and necessary in order to provide the required intensity of service to ensure the patient's safety. The patient's presenting symptoms, physical exam findings, and initial radiographic and laboratory data in the context of their chronic comorbidities is felt to place them at high risk for further clinical deterioration. Furthermore, it is not anticipated that the patient will be medically stable for discharge from the hospital within 2 midnights of admission.   * I certify that at the point of admission it is my clinical judgment that the patient will require  inpatient hospital care spanning beyond 2 midnights from the point of admission due to high intensity of service, high risk for further deterioration and high frequency of surveillance required.*  Author: Buena Irish, MD 07/20/2023 12:00 AM  For on call review [email or web address].

## 2023-07-21 ENCOUNTER — Inpatient Hospital Stay (HOSPITAL_COMMUNITY): Payer: Medicare Other

## 2023-07-21 ENCOUNTER — Encounter (HOSPITAL_COMMUNITY): Payer: Self-pay | Admitting: Internal Medicine

## 2023-07-21 DIAGNOSIS — B952 Enterococcus as the cause of diseases classified elsewhere: Secondary | ICD-10-CM | POA: Diagnosis not present

## 2023-07-21 DIAGNOSIS — R7881 Bacteremia: Secondary | ICD-10-CM | POA: Diagnosis not present

## 2023-07-21 DIAGNOSIS — N200 Calculus of kidney: Secondary | ICD-10-CM | POA: Insufficient documentation

## 2023-07-21 DIAGNOSIS — A498 Other bacterial infections of unspecified site: Secondary | ICD-10-CM | POA: Diagnosis not present

## 2023-07-21 LAB — CBC
HCT: 32.9 % — ABNORMAL LOW (ref 39.0–52.0)
Hemoglobin: 11.2 g/dL — ABNORMAL LOW (ref 13.0–17.0)
MCH: 33.7 pg (ref 26.0–34.0)
MCHC: 34 g/dL (ref 30.0–36.0)
MCV: 99.1 fL (ref 80.0–100.0)
Platelets: 133 10*3/uL — ABNORMAL LOW (ref 150–400)
RBC: 3.32 MIL/uL — ABNORMAL LOW (ref 4.22–5.81)
RDW: 14.6 % (ref 11.5–15.5)
WBC: 15.6 10*3/uL — ABNORMAL HIGH (ref 4.0–10.5)
nRBC: 0 % (ref 0.0–0.2)

## 2023-07-21 LAB — ECHOCARDIOGRAM COMPLETE
AR max vel: 2.32 cm2
AV Area VTI: 2.53 cm2
AV Area mean vel: 2.55 cm2
AV Mean grad: 5 mm[Hg]
AV Peak grad: 8.9 mm[Hg]
Ao pk vel: 1.49 m/s
Area-P 1/2: 3.68 cm2
Est EF: 55
Height: 68 in
S' Lateral: 3.1 cm
Weight: 2673.74 [oz_av]

## 2023-07-21 LAB — HEPATITIS B SURFACE ANTIGEN: Hepatitis B Surface Ag: NONREACTIVE

## 2023-07-21 LAB — HIV ANTIBODY (ROUTINE TESTING W REFLEX): HIV Screen 4th Generation wRfx: NONREACTIVE

## 2023-07-21 LAB — HEPATITIS A ANTIBODY, TOTAL: hep A Total Ab: NONREACTIVE

## 2023-07-21 MED ORDER — DEXTROSE-SODIUM CHLORIDE 5-0.9 % IV SOLN: INTRAVENOUS | Status: DC

## 2023-07-21 MED ORDER — IOHEXOL 300 MG/ML  SOLN
100.0000 mL | Freq: Once | INTRAMUSCULAR | Status: AC | PRN
  Administered 2023-07-21: 100 mL via INTRAVENOUS

## 2023-07-21 MED ORDER — IOHEXOL 9 MG/ML PO SOLN
500.0000 mL | ORAL | Status: AC
  Administered 2023-07-21 (×2): 500 mL via ORAL

## 2023-07-21 MED ORDER — IOHEXOL 9 MG/ML PO SOLN
ORAL | Status: AC
  Filled 2023-07-21: qty 1000

## 2023-07-21 MED ORDER — LOPERAMIDE HCL 2 MG PO CAPS
2.0000 mg | ORAL_CAPSULE | ORAL | Status: DC | PRN
  Administered 2023-07-21 – 2023-07-22 (×5): 2 mg via ORAL
  Filled 2023-07-21 (×7): qty 1

## 2023-07-21 NOTE — Plan of Care (Signed)

## 2023-07-21 NOTE — Progress Notes (Signed)
   07/21/23 1320  TOC Brief Assessment  Insurance and Status Reviewed  Patient has primary care physician Yes  Home environment has been reviewed Home w/ spouse  Prior level of function: Independent/modified independent  Prior/Current Home Services No current home services  Social Drivers of Health Review SDOH reviewed no interventions necessary  Readmission risk has been reviewed Yes  Transition of care needs transition of care needs identified, TOC will continue to follow

## 2023-07-21 NOTE — Progress Notes (Signed)
[facility] Progress Note  Patient: Luis Villegas     JYN:[id number]  DOA: 07/19/2023   PCP: Deeann Saint, MD       Brief hospital course: This is a 77 year old male with CKD, chronic diarrhea who fell at home.  His wife was unable to get him up and therefore called EMS and he was brought to the hospital.  His temperature was noted to be 102.2 degrees in the ED.  The patient and his wife had not noted any fevers at home.  He had not had any cough sore throat or runny nose.  He had no GI symptoms.  His wife did note that he complained of burning micturition.  He has never had a urinary tract infection in the past. In the ED, COVID, RSV and influenza test were negative.  Chest x-ray and UA were unremarkable Due to his confusion, an LP was performed.  Subjective:  The patient has no complaints today.  He allows his wife to give most of the history.  He has no issues with nausea vomiting or abdominal pain.  No urinary difficulties.  The watery eyes and runny nose which she had yesterday has resolved.   Assessment and Plan: Principal Problem:   Enterococcus faecalis infection, severe sepsis -LP was not consistent with an underlying infection - WBC count has improved only slightly from 16.2-15.6 -The source is not immediately obvious - Unasyn has been started  -Transthoracic echo completed today- No vegetations noted-EF is 50% -He continues to be febrile-will obtain a CT of the abdomen and pelvis to see if there is any focus of infection there-according to the patient and his wife, he has chronic diarrhea and had 2 BMs yesterday which is not unusual - Hepatitis and HIV serologies ordered  Active Problems: Acute metabolic encephalopathy - Likely secondary to above-mentioned infection - Mentation appears to have returned back to normal    Mild thrombocytopenia - Platelets 133 today-follow-might be secondary to sepsis  History of prostate cancer - status post  prostatectomy and XRT  History of chronic diarrhea - According to his wife, he takes at least 2 tabs of Imodium daily and sometimes more - Can resume Imodium today   Chronic kidney disease stage IIIa    Code Status: Full Code Total time on patient care: 35 minutes DVT prophylaxis:  enoxaparin (LOVENOX) injection 40 mg Start: 07/20/23 2000     Objective:   Vitals:   07/21/23 0429 07/21/23 0500 07/21/23 0647 07/21/23 1045  BP:      Pulse:      Resp:      Temp: 99.6 F (37.6 C) 99.3 F (37.4 C) 98.2 F (36.8 C) 99.3 F (37.4 C)  TempSrc: Oral Oral Oral Oral  SpO2:      Weight:      Height:       Filed Weights   07/19/23 2323 07/20/23 0026  Weight: 73 kg 75.8 kg   Exam: General exam: Appears comfortable  HEENT: oral mucosa moist Respiratory system: Clear to auscultation.  Cardiovascular system: S1 & S2 heard  Gastrointestinal system: Abdomen soft, moderately distended and tympanic today, nontender, normal bowel sounds   Extremities: No cyanosis, clubbing or edema Psychiatry:  Mood & affect appropriate.      CBC: Recent Labs  Lab 07/19/23 1916 07/20/23 0527 07/21/23 0548  WBC 13.3* 16.2* 15.6*  NEUTROABS 11.1*  --   --   HGB 12.2* 11.1* 11.2*  HCT 38.2* 35.0* 32.9*  MCV 101.1* 102.0*  99.1  PLT 182 155 133*   Basic Metabolic Panel: Recent Labs  Lab 07/19/23 1916 07/20/23 0527  NA 136 135  K 3.8 3.5  CL 106 107  CO2 24 20*  GLUCOSE 100* 103*  BUN 27* 23  CREATININE 1.49* 1.34*  CALCIUM 9.2 8.8*     Scheduled Meds:  buPROPion  150 mg Oral Daily   citalopram  20 mg Oral Daily   enoxaparin (LOVENOX) injection  40 mg Subcutaneous Q24H   mirabegron ER  25 mg Oral Daily   pantoprazole  40 mg Oral Daily    Imaging and lab data personally reviewed   Author: Calvert Cantor  07/21/2023 12:09 PM  To contact [facility]>   Check the care team in [facility] and look for the attending/consulting [facility] provider listed  Log into [email or web address] and use  [facility] universal password   Go to> "[facility]"  and find provider  If you still have difficulty reaching the provider, please page the [facility] (Director on Call) for the Hospitalists listed on amion

## 2023-07-21 NOTE — Progress Notes (Addendum)
   07/21/23 0318  Assess: MEWS Score  Temp (!) 102.6 F (39.2 C)  BP 124/68  MAP (mmHg) 82  Pulse Rate 82  Resp 20  Level of Consciousness Alert  SpO2 95 %  O2 Device Room Air  Assess: MEWS Score  MEWS Temp 2  MEWS Systolic 0  MEWS Pulse 0  MEWS RR 0  MEWS LOC 0  MEWS Score 2  MEWS Score Color Yellow  Assess: if the MEWS score is Yellow or Red  Were vital signs accurate and taken at a resting state? Yes  Does the patient meet 2 or more of the SIRS criteria? No  MEWS guidelines implemented  Yes, yellow  Treat  MEWS Interventions Considered administering scheduled or prn medications/treatments as ordered  Take Vital Signs  Increase Vital Sign Frequency  Yellow: Q2hr x1, continue Q4hrs until patient remains green for 12hrs  Escalate  MEWS: Escalate Yellow: Discuss with charge nurse and consider notifying provider and/or RRT  Notify: Charge Nurse/RN  Name of Charge Nurse/RN Notified Tom, RN  Provider Notification  Provider Name/Title Fayrene Fearing, NP  Date Provider Notified 07/21/23  Time Provider Notified [phone]  Method of Notification Page (secure chat)  Notification Reason Other (Comment) (yellow MEWS)  Provider response Other (Comment) (waiting for response)  Assess: SIRS CRITERIA  SIRS Temperature  1  SIRS Respirations  0  SIRS Pulse 0  SIRS WBC 0  SIRS Score Sum  1   Daniels, NP recommended ice packs, covers off and temp in door to be turned down. Patient also given Tylenol and is being monitored.

## 2023-07-21 NOTE — Progress Notes (Signed)
Subjective: No new complaints, feeling much better   Antibiotics:  Anti-infectives (From admission, onward)    Start     Dose/Rate Route Frequency Ordered Stop   07/21/23 1500  vancomycin (VANCOREADY) IVPB 1250 mg/250 mL  Status:  Discontinued        1,250 mg 166.7 mL/hr over 90 Minutes Intravenous Every 24 hours 07/20/23 1410 07/20/23 1526   07/20/23 1800  Ampicillin-Sulbactam (UNASYN) 3 g in sodium chloride 0.9 % 100 mL IVPB        3 g 200 mL/hr over 30 Minutes Intravenous Every 6 hours 07/20/23 1526     07/20/23 1500  Vancomycin (VANCOCIN) 1,500 mg in sodium chloride 0.9 % 500 mL IVPB  Status:  Discontinued        1,500 mg 250 mL/hr over 120 Minutes Intravenous STAT 07/20/23 1408 07/20/23 1526   07/19/23 2030  cefTRIAXone (ROCEPHIN) 2 g in sodium chloride 0.9 % 100 mL IVPB  Status:  Discontinued        2 g 200 mL/hr over 30 Minutes Intravenous Every 24 hours 07/19/23 2021 07/20/23 1526       Medications: Scheduled Meds:  buPROPion  150 mg Oral Daily   citalopram  20 mg Oral Daily   enoxaparin (LOVENOX) injection  40 mg Subcutaneous Q24H   mirabegron ER  25 mg Oral Daily   pantoprazole  40 mg Oral Daily   Continuous Infusions:  ampicillin-sulbactam (UNASYN) IV 3 g (07/21/23 1841)   dextrose 5 % and 0.9 % NaCl 75 mL/hr at 07/21/23 1019   PRN Meds:.acetaminophen **OR** acetaminophen, loperamide, ondansetron **OR** ondansetron (ZOFRAN) IV    Objective: Weight change:   Intake/Output Summary (Last 24 hours) at 07/21/2023 1935 Last data filed at 07/21/2023 1841 Gross per 24 hour  Intake 100 ml  Output 1850 ml  Net -1750 ml   Blood pressure 130/61, pulse 82, temperature 99.2 F (37.3 C), resp. rate 18, height 5\' 8"  (1.727 m), weight 75.8 kg, SpO2 98%. Temp:  [98.2 F (36.8 C)-102.6 F (39.2 C)] 99.2 F (37.3 C) (12/17 1550) Pulse Rate:  [82] 82 (12/17 1550) Resp:  [18-20] 18 (12/17 1550) BP: (124-130)/(61-68) 130/61 (12/17 1550) SpO2:  [95 %-98 %]  98 % (12/17 1550)  Physical Exam: Physical Exam Constitutional:      Appearance: He is well-developed.  HENT:     Head: Normocephalic and atraumatic.  Eyes:     Conjunctiva/sclera: Conjunctivae normal.  Cardiovascular:     Rate and Rhythm: Regular rhythm. Bradycardia present.     Heart sounds: No murmur heard.    No friction rub. No gallop.  Pulmonary:     Effort: Pulmonary effort is normal. No respiratory distress.     Breath sounds: Normal breath sounds. No stridor. No wheezing.  Abdominal:     General: There is no distension.     Palpations: Abdomen is soft.  Musculoskeletal:        General: Normal range of motion.     Cervical back: Normal range of motion and neck supple.  Skin:    General: Skin is warm and dry.     Findings: No erythema or rash.  Neurological:     General: No focal deficit present.     Mental Status: He is alert and oriented to person, place, and time.  Psychiatric:        Mood and Affect: Mood normal.        Behavior: Behavior normal.  Thought Content: Thought content normal.        Judgment: Judgment normal.      CBC:    BMET Recent Labs    07/19/23 1916 07/20/23 0527  NA 136 135  K 3.8 3.5  CL 106 107  CO2 24 20*  GLUCOSE 100* 103*  BUN 27* 23  CREATININE 1.49* 1.34*  CALCIUM 9.2 8.8*     Liver Panel  No results for input(s): "PROT", "ALBUMIN", "AST", "ALT", "ALKPHOS", "BILITOT", "BILIDIR", "IBILI" in the last 72 hours.     Sedimentation Rate No results for input(s): "ESRSEDRATE" in the last 72 hours. C-Reactive Protein No results for input(s): "CRP" in the last 72 hours.  Micro Results: Recent Results (from the past 720 hours)  Blood Culture (routine x 2)     Status: Abnormal (Preliminary result)   Collection Time: 07/19/23  7:21 PM   Specimen: Right Antecubital; Blood  Result Value Ref Range Status   Specimen Description   Final    RIGHT ANTECUBITAL BOTTLES DRAWN AEROBIC AND ANAEROBIC Performed at [facility], [address], [place], [place] [address]    Special Requests   Final    Blood Culture adequate volume Performed at [facility], [address] [place], [place] [address]    Culture  Setup Time   Final    GRAM POSITIVE COCCI IN BOTH AEROBIC AND ANAEROBIC BOTTLES CRITICAL RESULT CALLED TO, READ BACK BY AND VERIFIED WITH: PHARMD D.WOFFORD AT 07/20/2023 BY T.SAAD.    Culture (A)  Final    ENTEROCOCCUS FAECALIS SUSCEPTIBILITIES TO FOLLOW Performed at [facility], [address], [place], [place] [address]    Report Status PENDING  Incomplete  Blood Culture ID Panel (Reflexed)     Status: Abnormal   Collection Time: 07/19/23  7:21 PM  Result Value Ref Range Status   Enterococcus faecalis DETECTED (A) NOT DETECTED Final    Comment: CRITICAL RESULT CALLED TO, READ BACK BY AND VERIFIED WITH: PHARMD D.WOFFORD AT 07/20/2023 BY T.SAAD.    Enterococcus Faecium NOT DETECTED NOT DETECTED Final   Listeria monocytogenes NOT DETECTED NOT DETECTED Final   Staphylococcus species NOT DETECTED NOT DETECTED Final   Staphylococcus aureus (BCID) NOT DETECTED NOT DETECTED Final   Staphylococcus epidermidis NOT DETECTED NOT DETECTED Final   Staphylococcus lugdunensis NOT DETECTED NOT DETECTED Final   Streptococcus species NOT DETECTED NOT DETECTED Final   Streptococcus agalactiae NOT DETECTED NOT DETECTED Final   Streptococcus pneumoniae NOT DETECTED NOT DETECTED Final   Streptococcus pyogenes NOT DETECTED NOT DETECTED Final   A.calcoaceticus-baumannii NOT DETECTED NOT DETECTED Final   Bacteroides fragilis NOT DETECTED NOT DETECTED Final   Enterobacterales NOT DETECTED NOT DETECTED Final   Enterobacter cloacae complex NOT DETECTED NOT DETECTED Final   Escherichia coli NOT DETECTED NOT DETECTED Final   Klebsiella aerogenes NOT DETECTED NOT DETECTED Final   Klebsiella oxytoca NOT DETECTED NOT DETECTED Final   Klebsiella pneumoniae NOT DETECTED NOT  DETECTED Final   Proteus species NOT DETECTED NOT DETECTED Final   Salmonella species NOT DETECTED NOT DETECTED Final   Serratia marcescens NOT DETECTED NOT DETECTED Final   Haemophilus influenzae NOT DETECTED NOT DETECTED Final   Neisseria meningitidis NOT DETECTED NOT DETECTED Final   Pseudomonas aeruginosa NOT DETECTED NOT DETECTED Final   Stenotrophomonas maltophilia NOT DETECTED NOT DETECTED Final   Candida albicans NOT DETECTED NOT DETECTED Final   Candida auris NOT DETECTED NOT DETECTED Final   Candida glabrata NOT  DETECTED NOT DETECTED Final   Candida krusei NOT DETECTED NOT DETECTED Final   Candida parapsilosis NOT DETECTED NOT DETECTED Final   Candida tropicalis NOT DETECTED NOT DETECTED Final   Cryptococcus neoformans/gattii NOT DETECTED NOT DETECTED Final   Vancomycin resistance NOT DETECTED NOT DETECTED Final    Comment: Performed at [facility], [address] [place], [place] [address]  Resp panel by RT-PCR (RSV, Flu A&B, Covid) Anterior Nasal Swab     Status: None   Collection Time: 07/19/23  7:43 PM   Specimen: Anterior Nasal Swab  Result Value Ref Range Status   SARS Coronavirus 2 by RT PCR NEGATIVE NEGATIVE Final    Comment: (NOTE) SARS-CoV-2 target nucleic acids are NOT DETECTED.  The SARS-CoV-2 RNA is generally detectable in upper respiratory specimens during the acute phase of infection. The lowest concentration of SARS-CoV-2 viral copies this assay can detect is 138 copies/mL. A negative result does not preclude SARS-Cov-2 infection and should not be used as the sole basis for treatment or other patient management decisions. A negative result may occur with  improper specimen collection/handling, submission of specimen other than nasopharyngeal swab, presence of viral mutation(s) within the areas targeted by this assay, and inadequate number of viral copies(<138 copies/mL). A negative result must be combined with clinical observations, patient  history, and epidemiological information. The expected result is Negative.  Fact Sheet for Patients:  BloggerCourse.com  Fact Sheet for Healthcare Providers:  SeriousBroker.it  This test is no t yet approved or cleared by the [place] FDA and  has been authorized for detection and/or diagnosis of SARS-CoV-2 by FDA under an Emergency Use Authorization (EUA). This EUA will remain  in effect (meaning this test can be used) for the duration of the COVID-19 declaration under Section 564(b)(1) of the Act, 21 U.S.C.section 360bbb-3(b)(1), unless the authorization is terminated  or revoked sooner.       Influenza A by PCR NEGATIVE NEGATIVE Final   Influenza B by PCR NEGATIVE NEGATIVE Final    Comment: (NOTE) The Xpert Xpress SARS-CoV-2/FLU/RSV plus assay is intended as an aid in the diagnosis of influenza from Nasopharyngeal swab specimens and should not be used as a sole basis for treatment. Nasal washings and aspirates are unacceptable for Xpert Xpress SARS-CoV-2/FLU/RSV testing.  Fact Sheet for Patients: BloggerCourse.com  Fact Sheet for Healthcare Providers: SeriousBroker.it  This test is not yet approved or cleared by the [place] FDA and has been authorized for detection and/or diagnosis of SARS-CoV-2 by FDA under an Emergency Use Authorization (EUA). This EUA will remain in effect (meaning this test can be used) for the duration of the COVID-19 declaration under Section 564(b)(1) of the Act, 21 U.S.C. section 360bbb-3(b)(1), unless the authorization is terminated or revoked.     Resp Syncytial Virus by PCR NEGATIVE NEGATIVE Final    Comment: (NOTE) Fact Sheet for Patients: BloggerCourse.com  Fact Sheet for Healthcare Providers: SeriousBroker.it  This test is not yet approved or cleared by the [place] FDA  and has been authorized for detection and/or diagnosis of SARS-CoV-2 by FDA under an Emergency Use Authorization (EUA). This EUA will remain in effect (meaning this test can be used) for the duration of the COVID-19 declaration under Section 564(b)(1) of the Act, 21 U.S.C. section 360bbb-3(b)(1), unless the authorization is terminated or revoked.  Performed at [facility], [address], [place], [place] [address]   Blood Culture (routine x 2)     Status: Abnormal (Preliminary  result)   Collection Time: 07/19/23  8:00 PM   Specimen: BLOOD RIGHT ARM  Result Value Ref Range Status   Specimen Description   Final    BLOOD RIGHT ARM BOTTLES DRAWN AEROBIC AND ANAEROBIC Performed at [facility], [address], [place], [place] [address]    Special Requests   Final    Blood Culture results may not be optimal due to an inadequate volume of blood received in culture bottles Performed at [facility], [address], [place], [place] [address]    Culture  Setup Time   Final    GRAM POSITIVE COCCI AEROBIC BOTTLE ONLY CRITICAL VALUE NOTED.  VALUE IS CONSISTENT WITH PREVIOUSLY REPORTED AND CALLED VALUE. Performed at [facility], [address], [place], [place] [address]    Culture ENTEROCOCCUS FAECALIS (A)  Final   Report Status PENDING  Incomplete  CSF culture w Gram Stain     Status: None (Preliminary result)   Collection Time: 07/19/23 10:39 PM   Specimen: CSF; Cerebrospinal Fluid  Result Value Ref Range Status   Specimen Description   Final    CSF Performed at [facility], [address], [place], [place] [address]    Special Requests   Final    CSF Performed at [facility], [address], [place], [place] [address]    Gram Stain NO WBC SEEN NO ORGANISMS SEEN CYTOSPIN SMEAR   Final   Culture   Final    NO GROWTH 1 DAY Performed at [facility], [address] [place], [place] [address]    Report Status PENDING  Incomplete  Culture, blood (Routine X 2) w Reflex to ID Panel     Status: None (Preliminary result)   Collection Time: 07/21/23  5:37 AM   Specimen: BLOOD LEFT ARM  Result Value Ref Range Status   Specimen Description   Final    BLOOD LEFT ARM Performed at [facility], [address] [place], [place] [address]    Special Requests   Final    BOTTLES DRAWN AEROBIC AND ANAEROBIC Blood Culture results may not be optimal due to an inadequate volume of blood received in culture bottles Performed at [facility], [address] [place], [place] [address]    Culture PENDING  Incomplete   Report Status PENDING  Incomplete  Culture, blood (Routine X 2) w Reflex to ID Panel     Status: None (Preliminary result)   Collection Time: 07/21/23  5:48 AM   Specimen: BLOOD LEFT ARM  Result Value Ref Range Status   Specimen Description   Final    BLOOD LEFT ARM Performed at [facility], [address] [place], [place] [address]    Special Requests   Final    BOTTLES DRAWN AEROBIC AND ANAEROBIC Blood Culture results may not be optimal due to an inadequate volume of blood received in culture bottles Performed at [facility], [address], [place], [place] [address]    Culture PENDING  Incomplete   Report Status PENDING  Incomplete    Studies/Results: CT ABDOMEN PELVIS W CONTRAST Result Date: 07/21/2023 CLINICAL DATA:  Sepsis, fever, chronic diarrhea and chronic kidney disease. EXAM: CT ABDOMEN AND PELVIS WITH CONTRAST TECHNIQUE: Multidetector CT imaging of the abdomen and pelvis was performed using the standard protocol following bolus administration of intravenous contrast. RADIATION DOSE REDUCTION: This exam was performed according to  the departmental dose-optimization program which includes automated exposure control, adjustment of the mA and/or kV according to patient size and/or use of  iterative reconstruction technique. CONTRAST:  OMNIPAQUE IOHEXOL 300 MG/ML  SOLN COMPARISON:  CT of the pelvis on 03/19/2007 FINDINGS: Lower chest: Trace pleural fluid at the left lung base. Hepatobiliary: No focal liver abnormality is seen. No gallstones, gallbladder wall thickening, or biliary dilatation. Pancreas: Unremarkable. No pancreatic ductal dilatation or surrounding inflammatory changes. Spleen: Normal in size without focal abnormality. Adrenals/Urinary Tract: No adrenal masses. Both kidneys contain numerous scattered small nonobstructing calculi. The biggest on the right within the upper pole measures approximately 6 mm. The biggest on the left in the mid kidney measures approximately 6 mm. There is some perinephric stranding bilaterally but no evidence to suggest obvious pyelonephritis by delayed imaging. The bladder contains a single dependent calculus in the midline dependent lumen measuring approximately 8 mm in maximum diameter. No evidence of bladder wall thickening or diverticula. Stomach/Bowel: Bowel shows no evidence of obstruction, ileus, inflammation or lesion. The appendix is normal. No free intraperitoneal air. Vascular/Lymphatic: Atherosclerosis of the abdominal aorta and iliac arteries without aneurysm. No lymphadenopathy identified. Reproductive: Stable appearance status post prior prostatectomy. Other: Small left inguinal hernia containing fat. 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 No lesions or fractures identified. IMPRESSION: 1. Numerous scattered small nonobstructing calculi in both kidneys. There is some perinephric stranding bilaterally but no evidence to suggest obvious pyelonephritis by delayed imaging. 2. Single dependent calculus in the bladder measuring approximately 8 mm in maximum diameter. 3. Atherosclerosis of the abdominal aorta and iliac arteries without aneurysm. 4. Small left inguinal hernia containing fat. 5.  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 6. Trace pleural fluid at the left lung base. Aortic Atherosclerosis (ICD10-I70.0). Electronically Signed   By: Irish Lack M.D.   On: 07/21/2023 16:49   ECHOCARDIOGRAM COMPLETE Result Date: 07/21/2023    ECHOCARDIOGRAM REPORT   Patient Name:   Luis Villegas Date of Exam: 07/21/2023 Medical Rec #:  [id number]                Height:       68.0 in Accession #:    [id number]               Weight:       167.1 lb Date of Birth:  Dec 19, 1945                BSA:          1.893 m Patient Age:    77 years                 BP:           124/68 mmHg Patient Gender: M                        HR:           69 bpm. Exam Location:  Inpatient Procedure: 2D Echo, Cardiac Doppler and Color Doppler Indications:    Bacteremia  History:        Patient has no prior history of Echocardiogram examinations.                 Signs/Symptoms:Murmur.  Sonographer:    Darlys Gales Referring Phys: [phone] Aiken Withem N [place] IMPRESSIONS  1. Left ventricular ejection fraction, by estimation, is 55%. The left ventricle has normal  function. The left ventricle has no regional wall motion abnormalities. Left ventricular diastolic parameters were normal.  2. Right ventricular systolic function is normal. The right ventricular size is normal. There is normal pulmonary artery systolic pressure. The estimated right ventricular systolic pressure is 29.4 mmHg.  3. The mitral valve is normal in structure. No evidence of mitral valve regurgitation. No evidence of mitral stenosis.  4. The aortic valve is tricuspid. There is mild calcification of the aortic valve. Aortic valve regurgitation is not visualized. No aortic stenosis is present.  5. The inferior vena cava is normal in size with greater than 50% respiratory variability, suggesting right atrial pressure of 3 mmHg.  6. Technically difficult study. No valvular vegetation noted but if high suspicion would need TEE. FINDINGS  Left  Ventricle: Left ventricular ejection fraction, by estimation, is 55%. The left ventricle has normal function. The left ventricle has no regional wall motion abnormalities. The left ventricular internal cavity size was normal in size. There is no left ventricular hypertrophy. Left ventricular diastolic parameters were normal. Right Ventricle: The right ventricular size is normal. No increase in right ventricular wall thickness. Right ventricular systolic function is normal. There is normal pulmonary artery systolic pressure. The tricuspid regurgitant velocity is 2.57 m/s, and  with an assumed right atrial pressure of 3 mmHg, the estimated right ventricular systolic pressure is 29.4 mmHg. Left Atrium: Left atrial size was normal in size. Right Atrium: Right atrial size was normal in size. Pericardium: There is no evidence of pericardial effusion. Mitral Valve: The mitral valve is normal in structure. No evidence of mitral valve regurgitation. No evidence of mitral valve stenosis. Tricuspid Valve: The tricuspid valve is normal in structure. Tricuspid valve regurgitation is trivial. Aortic Valve: The aortic valve is tricuspid. There is mild calcification of the aortic valve. Aortic valve regurgitation is not visualized. No aortic stenosis is present. Aortic valve mean gradient measures 5.0 mmHg. Aortic valve peak gradient measures 8.9 mmHg. Aortic valve area, by VTI measures 2.53 cm. Pulmonic Valve: The pulmonic valve was normal in structure. Pulmonic valve regurgitation is trivial. Aorta: The aortic root is normal in size and structure. Venous: The inferior vena cava is normal in size with greater than 50% respiratory variability, suggesting right atrial pressure of 3 mmHg. IAS/Shunts: No atrial level shunt detected by color flow Doppler.  LEFT VENTRICLE PLAX 2D LVIDd:         4.20 cm   Diastology LVIDs:         3.10 cm   LV e' medial:    9.57 cm/s LV PW:         0.90 cm   LV E/e' medial:  10.8 LV IVS:        0.90 cm    LV e' lateral:   9.57 cm/s LVOT diam:     1.90 cm   LV E/e' lateral: 10.8 LV SV:         76 LV SV Index:   40 LVOT Area:     2.84 cm  RIGHT VENTRICLE RV S prime:     10.30 cm/s TAPSE (M-mode): 2.8 cm LEFT ATRIUM             Index        RIGHT ATRIUM           Index LA Vol (A2C):   68.7 ml 36.28 ml/m  RA Area:     16.30 cm LA Vol (A4C):   39.7 ml 20.97 ml/m  RA  Volume:   49.60 ml  26.20 ml/m LA Biplane Vol: 55.3 ml 29.21 ml/m  AORTIC VALVE AV Area (Vmax):    2.32 cm AV Area (Vmean):   2.55 cm AV Area (VTI):     2.53 cm AV Vmax:           149.00 cm/s AV Vmean:          101.000 cm/s AV VTI:            0.302 m AV Peak Grad:      8.9 mmHg AV Mean Grad:      5.0 mmHg LVOT Vmax:         122.00 cm/s LVOT Vmean:        91.000 cm/s LVOT VTI:          0.269 m LVOT/AV VTI ratio: 0.89  AORTA Ao Root diam: 2.50 cm MITRAL VALVE                TRICUSPID VALVE MV Area (PHT): 3.68 cm     TR Peak grad:   26.4 mmHg MV Decel Time: 206 msec     TR Vmax:        257.00 cm/s MV E velocity: 103.00 cm/s MV A velocity: 87.80 cm/s   SHUNTS MV E/A ratio:  1.17         Systemic VTI:  0.27 m                             Systemic Diam: 1.90 cm Dalton McleanMD Electronically signed by Wilfred Lacy Signature Date/Time: 07/21/2023/9:15:34 AM    Final    CT Head Wo Contrast Result Date: 07/19/2023 CLINICAL DATA:  Head trauma, minor (Age >= 65y) EXAM: CT HEAD WITHOUT CONTRAST TECHNIQUE: Contiguous axial images were obtained from the base of the skull through the vertex without intravenous contrast. RADIATION DOSE REDUCTION: This exam was performed according to the departmental dose-optimization program which includes automated exposure control, adjustment of the mA and/or kV according to patient size and/or use of iterative reconstruction technique. COMPARISON:  09/05/2022 FINDINGS: Brain: Normal anatomic configuration. Parenchymal volume loss is commensurate with the patient's age. Stable mild periventricular white matter changes are  present likely reflecting the sequela of small vessel ischemia. No abnormal intra or extra-axial mass lesion or fluid collection. No abnormal mass effect or midline shift. No evidence of acute intracranial hemorrhage or infarct. Ventricular size is normal. Cerebellum unremarkable. Vascular: No asymmetric hyperdense vasculature at the skull base. Skull: Intact Sinuses/Orbits: Paranasal sinuses are clear. Orbits are unremarkable. Other: Mastoid air cells and middle ear cavities are clear. IMPRESSION: 1. No acute intracranial hemorrhage or infarct. No calvarial fracture. 2. Stable mild senescent change. Electronically Signed   By: Helyn Numbers M.D.   On: 07/19/2023 19:55      Assessment/Plan:  INTERVAL HISTORY: CT abdomen and pelvis without intra-abdominal abscess   Principal Problem:   Enterococcus faecalis infection Active Problems:   Sleep apnea   Chronic kidney disease (CKD), stage III (moderate) (HCC)   Sepsis (HCC)   Bacteremia   Dysuria    Kivon Marcellus is a 77 y.o. male with E faecalis bacteremia with sepsis, rule out endocarditis  #1 E faecalis bacteremia, R/O endocarditis  CT does not show clear cut abdominal source  He does have multiple kidney stones but urine was without pyuria to suggest infection  I have paged Cards Master to arrange TEE  I have personally spent  54 minutes involved in face-to-face and non-face-to-face activities for this patient on the day of the visit. Professional time spent includes the following activities: Preparing to see the patient (review of tests), Obtaining and/or reviewing separately obtained history (admission/discharge record), Performing a medically appropriate examination and/or evaluation , Ordering medications/tests/procedures, referring and communicating with other health care professionals, Documenting clinical information in the EMR, Independently interpreting results (not separately reported), Communicating results to the  patient/family/caregiver, Counseling and educating the patient/family/caregiver and Care coordination (not separately reported).     LOS: 2 days   Acey Lav 07/21/2023, 7:35 PM

## 2023-07-21 NOTE — H&P (View-Only) (Signed)
Subjective: No new complaints, feeling much better   Antibiotics:  Anti-infectives (From admission, onward)    Start     Dose/Rate Route Frequency Ordered Stop   07/21/23 1500  vancomycin (VANCOREADY) IVPB 1250 mg/250 mL  Status:  Discontinued        1,250 mg 166.7 mL/hr over 90 Minutes Intravenous Every 24 hours 07/20/23 1410 07/20/23 1526   07/20/23 1800  Ampicillin-Sulbactam (UNASYN) 3 g in sodium chloride 0.9 % 100 mL IVPB        3 g 200 mL/hr over 30 Minutes Intravenous Every 6 hours 07/20/23 1526     07/20/23 1500  Vancomycin (VANCOCIN) 1,500 mg in sodium chloride 0.9 % 500 mL IVPB  Status:  Discontinued        1,500 mg 250 mL/hr over 120 Minutes Intravenous STAT 07/20/23 1408 07/20/23 1526   07/19/23 2030  cefTRIAXone (ROCEPHIN) 2 g in sodium chloride 0.9 % 100 mL IVPB  Status:  Discontinued        2 g 200 mL/hr over 30 Minutes Intravenous Every 24 hours 07/19/23 2021 07/20/23 1526       Medications: Scheduled Meds:  buPROPion  150 mg Oral Daily   citalopram  20 mg Oral Daily   enoxaparin (LOVENOX) injection  40 mg Subcutaneous Q24H   mirabegron ER  25 mg Oral Daily   pantoprazole  40 mg Oral Daily   Continuous Infusions:  ampicillin-sulbactam (UNASYN) IV 3 g (07/21/23 1841)   dextrose 5 % and 0.9 % NaCl 75 mL/hr at 07/21/23 1019   PRN Meds:.acetaminophen **OR** acetaminophen, loperamide, ondansetron **OR** ondansetron (ZOFRAN) IV    Objective: Weight change:   Intake/Output Summary (Last 24 hours) at 07/21/2023 1935 Last data filed at 07/21/2023 1841 Gross per 24 hour  Intake 100 ml  Output 1850 ml  Net -1750 ml   Blood pressure 130/61, pulse 82, temperature 99.2 F (37.3 C), resp. rate 18, height 5\' 8"  (1.727 m), weight 75.8 kg, SpO2 98%. Temp:  [98.2 F (36.8 C)-102.6 F (39.2 C)] 99.2 F (37.3 C) (12/17 1550) Pulse Rate:  [82] 82 (12/17 1550) Resp:  [18-20] 18 (12/17 1550) BP: (124-130)/(61-68) 130/61 (12/17 1550) SpO2:  [95 %-98 %]  98 % (12/17 1550)  Physical Exam: Physical Exam Constitutional:      Appearance: He is well-developed.  HENT:     Head: Normocephalic and atraumatic.  Eyes:     Conjunctiva/sclera: Conjunctivae normal.  Cardiovascular:     Rate and Rhythm: Regular rhythm. Bradycardia present.     Heart sounds: No murmur heard.    No friction rub. No gallop.  Pulmonary:     Effort: Pulmonary effort is normal. No respiratory distress.     Breath sounds: Normal breath sounds. No stridor. No wheezing.  Abdominal:     General: There is no distension.     Palpations: Abdomen is soft.  Musculoskeletal:        General: Normal range of motion.     Cervical back: Normal range of motion and neck supple.  Skin:    General: Skin is warm and dry.     Findings: No erythema or rash.  Neurological:     General: No focal deficit present.     Mental Status: He is alert and oriented to person, place, and time.  Psychiatric:        Mood and Affect: Mood normal.        Behavior: Behavior normal.  Thought Content: Thought content normal.        Judgment: Judgment normal.      CBC:    BMET Recent Labs    07/19/23 1916 07/20/23 0527  NA 136 135  K 3.8 3.5  CL 106 107  CO2 24 20*  GLUCOSE 100* 103*  BUN 27* 23  CREATININE 1.49* 1.34*  CALCIUM 9.2 8.8*     Liver Panel  No results for input(s): "PROT", "ALBUMIN", "AST", "ALT", "ALKPHOS", "BILITOT", "BILIDIR", "IBILI" in the last 72 hours.     Sedimentation Rate No results for input(s): "ESRSEDRATE" in the last 72 hours. C-Reactive Protein No results for input(s): "CRP" in the last 72 hours.  Micro Results: Recent Results (from the past 720 hours)  Blood Culture (routine x 2)     Status: Abnormal (Preliminary result)   Collection Time: 07/19/23  7:21 PM   Specimen: Right Antecubital; Blood  Result Value Ref Range Status   Specimen Description   Final    RIGHT ANTECUBITAL BOTTLES DRAWN AEROBIC AND ANAEROBIC Performed at [facility], [address], [place], [place] [address]    Special Requests   Final    Blood Culture adequate volume Performed at [facility], [address] [place], [place] [address]    Culture  Setup Time   Final    GRAM POSITIVE COCCI IN BOTH AEROBIC AND ANAEROBIC BOTTLES CRITICAL RESULT CALLED TO, READ BACK BY AND VERIFIED WITH: PHARMD D.WOFFORD AT 07/20/2023 BY T.SAAD.    Culture (A)  Final    ENTEROCOCCUS FAECALIS SUSCEPTIBILITIES TO FOLLOW Performed at [facility], [address], [place], [place] [address]    Report Status PENDING  Incomplete  Blood Culture ID Panel (Reflexed)     Status: Abnormal   Collection Time: 07/19/23  7:21 PM  Result Value Ref Range Status   Enterococcus faecalis DETECTED (A) NOT DETECTED Final    Comment: CRITICAL RESULT CALLED TO, READ BACK BY AND VERIFIED WITH: PHARMD D.WOFFORD AT 07/20/2023 BY T.SAAD.    Enterococcus Faecium NOT DETECTED NOT DETECTED Final   Listeria monocytogenes NOT DETECTED NOT DETECTED Final   Staphylococcus species NOT DETECTED NOT DETECTED Final   Staphylococcus aureus (BCID) NOT DETECTED NOT DETECTED Final   Staphylococcus epidermidis NOT DETECTED NOT DETECTED Final   Staphylococcus lugdunensis NOT DETECTED NOT DETECTED Final   Streptococcus species NOT DETECTED NOT DETECTED Final   Streptococcus agalactiae NOT DETECTED NOT DETECTED Final   Streptococcus pneumoniae NOT DETECTED NOT DETECTED Final   Streptococcus pyogenes NOT DETECTED NOT DETECTED Final   A.calcoaceticus-baumannii NOT DETECTED NOT DETECTED Final   Bacteroides fragilis NOT DETECTED NOT DETECTED Final   Enterobacterales NOT DETECTED NOT DETECTED Final   Enterobacter cloacae complex NOT DETECTED NOT DETECTED Final   Escherichia coli NOT DETECTED NOT DETECTED Final   Klebsiella aerogenes NOT DETECTED NOT DETECTED Final   Klebsiella oxytoca NOT DETECTED NOT DETECTED Final   Klebsiella pneumoniae NOT DETECTED NOT  DETECTED Final   Proteus species NOT DETECTED NOT DETECTED Final   Salmonella species NOT DETECTED NOT DETECTED Final   Serratia marcescens NOT DETECTED NOT DETECTED Final   Haemophilus influenzae NOT DETECTED NOT DETECTED Final   Neisseria meningitidis NOT DETECTED NOT DETECTED Final   Pseudomonas aeruginosa NOT DETECTED NOT DETECTED Final   Stenotrophomonas maltophilia NOT DETECTED NOT DETECTED Final   Candida albicans NOT DETECTED NOT DETECTED Final   Candida auris NOT DETECTED NOT DETECTED Final   Candida glabrata NOT  DETECTED NOT DETECTED Final   Candida krusei NOT DETECTED NOT DETECTED Final   Candida parapsilosis NOT DETECTED NOT DETECTED Final   Candida tropicalis NOT DETECTED NOT DETECTED Final   Cryptococcus neoformans/gattii NOT DETECTED NOT DETECTED Final   Vancomycin resistance NOT DETECTED NOT DETECTED Final    Comment: Performed at [facility], [address] [place], [place] [address]  Resp panel by RT-PCR (RSV, Flu A&B, Covid) Anterior Nasal Swab     Status: None   Collection Time: 07/19/23  7:43 PM   Specimen: Anterior Nasal Swab  Result Value Ref Range Status   SARS Coronavirus 2 by RT PCR NEGATIVE NEGATIVE Final    Comment: (NOTE) SARS-CoV-2 target nucleic acids are NOT DETECTED.  The SARS-CoV-2 RNA is generally detectable in upper respiratory specimens during the acute phase of infection. The lowest concentration of SARS-CoV-2 viral copies this assay can detect is 138 copies/mL. A negative result does not preclude SARS-Cov-2 infection and should not be used as the sole basis for treatment or other patient management decisions. A negative result may occur with  improper specimen collection/handling, submission of specimen other than nasopharyngeal swab, presence of viral mutation(s) within the areas targeted by this assay, and inadequate number of viral copies(<138 copies/mL). A negative result must be combined with clinical observations, patient  history, and epidemiological information. The expected result is Negative.  Fact Sheet for Patients:  BloggerCourse.com  Fact Sheet for Healthcare Providers:  SeriousBroker.it  This test is no t yet approved or cleared by the [place] FDA and  has been authorized for detection and/or diagnosis of SARS-CoV-2 by FDA under an Emergency Use Authorization (EUA). This EUA will remain  in effect (meaning this test can be used) for the duration of the COVID-19 declaration under Section 564(b)(1) of the Act, 21 U.S.C.section 360bbb-3(b)(1), unless the authorization is terminated  or revoked sooner.       Influenza A by PCR NEGATIVE NEGATIVE Final   Influenza B by PCR NEGATIVE NEGATIVE Final    Comment: (NOTE) The Xpert Xpress SARS-CoV-2/FLU/RSV plus assay is intended as an aid in the diagnosis of influenza from Nasopharyngeal swab specimens and should not be used as a sole basis for treatment. Nasal washings and aspirates are unacceptable for Xpert Xpress SARS-CoV-2/FLU/RSV testing.  Fact Sheet for Patients: BloggerCourse.com  Fact Sheet for Healthcare Providers: SeriousBroker.it  This test is not yet approved or cleared by the [place] FDA and has been authorized for detection and/or diagnosis of SARS-CoV-2 by FDA under an Emergency Use Authorization (EUA). This EUA will remain in effect (meaning this test can be used) for the duration of the COVID-19 declaration under Section 564(b)(1) of the Act, 21 U.S.C. section 360bbb-3(b)(1), unless the authorization is terminated or revoked.     Resp Syncytial Virus by PCR NEGATIVE NEGATIVE Final    Comment: (NOTE) Fact Sheet for Patients: BloggerCourse.com  Fact Sheet for Healthcare Providers: SeriousBroker.it  This test is not yet approved or cleared by the [place] FDA  and has been authorized for detection and/or diagnosis of SARS-CoV-2 by FDA under an Emergency Use Authorization (EUA). This EUA will remain in effect (meaning this test can be used) for the duration of the COVID-19 declaration under Section 564(b)(1) of the Act, 21 U.S.C. section 360bbb-3(b)(1), unless the authorization is terminated or revoked.  Performed at [facility], [address], [place], [place] [address]   Blood Culture (routine x 2)     Status: Abnormal (Preliminary  result)   Collection Time: 07/19/23  8:00 PM   Specimen: BLOOD RIGHT ARM  Result Value Ref Range Status   Specimen Description   Final    BLOOD RIGHT ARM BOTTLES DRAWN AEROBIC AND ANAEROBIC Performed at [facility], [address], [place], [place] [address]    Special Requests   Final    Blood Culture results may not be optimal due to an inadequate volume of blood received in culture bottles Performed at [facility], [address], [place], [place] [address]    Culture  Setup Time   Final    GRAM POSITIVE COCCI AEROBIC BOTTLE ONLY CRITICAL VALUE NOTED.  VALUE IS CONSISTENT WITH PREVIOUSLY REPORTED AND CALLED VALUE. Performed at [facility], [address], [place], [place] [address]    Culture ENTEROCOCCUS FAECALIS (A)  Final   Report Status PENDING  Incomplete  CSF culture w Gram Stain     Status: None (Preliminary result)   Collection Time: 07/19/23 10:39 PM   Specimen: CSF; Cerebrospinal Fluid  Result Value Ref Range Status   Specimen Description   Final    CSF Performed at [facility], [address], [place], [place] [address]    Special Requests   Final    CSF Performed at [facility], [address], [place], [place] [address]    Gram Stain NO WBC SEEN NO ORGANISMS SEEN CYTOSPIN SMEAR   Final   Culture   Final    NO GROWTH 1 DAY Performed at [facility], [address] [place], [place] [address]    Report Status PENDING  Incomplete  Culture, blood (Routine X 2) w Reflex to ID Panel     Status: None (Preliminary result)   Collection Time: 07/21/23  5:37 AM   Specimen: BLOOD LEFT ARM  Result Value Ref Range Status   Specimen Description   Final    BLOOD LEFT ARM Performed at [facility], [address] [place], [place] [address]    Special Requests   Final    BOTTLES DRAWN AEROBIC AND ANAEROBIC Blood Culture results may not be optimal due to an inadequate volume of blood received in culture bottles Performed at [facility], [address] [place], [place] [address]    Culture PENDING  Incomplete   Report Status PENDING  Incomplete  Culture, blood (Routine X 2) w Reflex to ID Panel     Status: None (Preliminary result)   Collection Time: 07/21/23  5:48 AM   Specimen: BLOOD LEFT ARM  Result Value Ref Range Status   Specimen Description   Final    BLOOD LEFT ARM Performed at [facility], [address] [place], [place] [address]    Special Requests   Final    BOTTLES DRAWN AEROBIC AND ANAEROBIC Blood Culture results may not be optimal due to an inadequate volume of blood received in culture bottles Performed at [facility], [address], [place], [place] [address]    Culture PENDING  Incomplete   Report Status PENDING  Incomplete    Studies/Results: CT ABDOMEN PELVIS W CONTRAST Result Date: 07/21/2023 CLINICAL DATA:  Sepsis, fever, chronic diarrhea and chronic kidney disease. EXAM: CT ABDOMEN AND PELVIS WITH CONTRAST TECHNIQUE: Multidetector CT imaging of the abdomen and pelvis was performed using the standard protocol following bolus administration of intravenous contrast. RADIATION DOSE REDUCTION: This exam was performed according to  the departmental dose-optimization program which includes automated exposure control, adjustment of the mA and/or kV according to patient size and/or use of  iterative reconstruction technique. CONTRAST:  OMNIPAQUE IOHEXOL 300 MG/ML  SOLN COMPARISON:  CT of the pelvis on 03/19/2007 FINDINGS: Lower chest: Trace pleural fluid at the left lung base. Hepatobiliary: No focal liver abnormality is seen. No gallstones, gallbladder wall thickening, or biliary dilatation. Pancreas: Unremarkable. No pancreatic ductal dilatation or surrounding inflammatory changes. Spleen: Normal in size without focal abnormality. Adrenals/Urinary Tract: No adrenal masses. Both kidneys contain numerous scattered small nonobstructing calculi. The biggest on the right within the upper pole measures approximately 6 mm. The biggest on the left in the mid kidney measures approximately 6 mm. There is some perinephric stranding bilaterally but no evidence to suggest obvious pyelonephritis by delayed imaging. The bladder contains a single dependent calculus in the midline dependent lumen measuring approximately 8 mm in maximum diameter. No evidence of bladder wall thickening or diverticula. Stomach/Bowel: Bowel shows no evidence of obstruction, ileus, inflammation or lesion. The appendix is normal. No free intraperitoneal air. Vascular/Lymphatic: Atherosclerosis of the abdominal aorta and iliac arteries without aneurysm. No lymphadenopathy identified. Reproductive: Stable appearance status post prior prostatectomy. Other: Small left inguinal hernia containing fat. 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 No lesions or fractures identified. IMPRESSION: 1. Numerous scattered small nonobstructing calculi in both kidneys. There is some perinephric stranding bilaterally but no evidence to suggest obvious pyelonephritis by delayed imaging. 2. Single dependent calculus in the bladder measuring approximately 8 mm in maximum diameter. 3. Atherosclerosis of the abdominal aorta and iliac arteries without aneurysm. 4. Small left inguinal hernia containing fat. 5.  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 6. Trace pleural fluid at the left lung base. Aortic Atherosclerosis (ICD10-I70.0). Electronically Signed   By: Irish Lack M.D.   On: 07/21/2023 16:49   ECHOCARDIOGRAM COMPLETE Result Date: 07/21/2023    ECHOCARDIOGRAM REPORT   Patient Name:   Luis Villegas Date of Exam: 07/21/2023 Medical Rec #:  [id number]                Height:       68.0 in Accession #:    [id number]               Weight:       167.1 lb Date of Birth:  Dec 19, 1945                BSA:          1.893 m Patient Age:    77 years                 BP:           124/68 mmHg Patient Gender: M                        HR:           69 bpm. Exam Location:  Inpatient Procedure: 2D Echo, Cardiac Doppler and Color Doppler Indications:    Bacteremia  History:        Patient has no prior history of Echocardiogram examinations.                 Signs/Symptoms:Murmur.  Sonographer:    Darlys Gales Referring Phys: [phone] Aiken Withem N [place] IMPRESSIONS  1. Left ventricular ejection fraction, by estimation, is 55%. The left ventricle has normal  function. The left ventricle has no regional wall motion abnormalities. Left ventricular diastolic parameters were normal.  2. Right ventricular systolic function is normal. The right ventricular size is normal. There is normal pulmonary artery systolic pressure. The estimated right ventricular systolic pressure is 29.4 mmHg.  3. The mitral valve is normal in structure. No evidence of mitral valve regurgitation. No evidence of mitral stenosis.  4. The aortic valve is tricuspid. There is mild calcification of the aortic valve. Aortic valve regurgitation is not visualized. No aortic stenosis is present.  5. The inferior vena cava is normal in size with greater than 50% respiratory variability, suggesting right atrial pressure of 3 mmHg.  6. Technically difficult study. No valvular vegetation noted but if high suspicion would need TEE. FINDINGS  Left  Ventricle: Left ventricular ejection fraction, by estimation, is 55%. The left ventricle has normal function. The left ventricle has no regional wall motion abnormalities. The left ventricular internal cavity size was normal in size. There is no left ventricular hypertrophy. Left ventricular diastolic parameters were normal. Right Ventricle: The right ventricular size is normal. No increase in right ventricular wall thickness. Right ventricular systolic function is normal. There is normal pulmonary artery systolic pressure. The tricuspid regurgitant velocity is 2.57 m/s, and  with an assumed right atrial pressure of 3 mmHg, the estimated right ventricular systolic pressure is 29.4 mmHg. Left Atrium: Left atrial size was normal in size. Right Atrium: Right atrial size was normal in size. Pericardium: There is no evidence of pericardial effusion. Mitral Valve: The mitral valve is normal in structure. No evidence of mitral valve regurgitation. No evidence of mitral valve stenosis. Tricuspid Valve: The tricuspid valve is normal in structure. Tricuspid valve regurgitation is trivial. Aortic Valve: The aortic valve is tricuspid. There is mild calcification of the aortic valve. Aortic valve regurgitation is not visualized. No aortic stenosis is present. Aortic valve mean gradient measures 5.0 mmHg. Aortic valve peak gradient measures 8.9 mmHg. Aortic valve area, by VTI measures 2.53 cm. Pulmonic Valve: The pulmonic valve was normal in structure. Pulmonic valve regurgitation is trivial. Aorta: The aortic root is normal in size and structure. Venous: The inferior vena cava is normal in size with greater than 50% respiratory variability, suggesting right atrial pressure of 3 mmHg. IAS/Shunts: No atrial level shunt detected by color flow Doppler.  LEFT VENTRICLE PLAX 2D LVIDd:         4.20 cm   Diastology LVIDs:         3.10 cm   LV e' medial:    9.57 cm/s LV PW:         0.90 cm   LV E/e' medial:  10.8 LV IVS:        0.90 cm    LV e' lateral:   9.57 cm/s LVOT diam:     1.90 cm   LV E/e' lateral: 10.8 LV SV:         76 LV SV Index:   40 LVOT Area:     2.84 cm  RIGHT VENTRICLE RV S prime:     10.30 cm/s TAPSE (M-mode): 2.8 cm LEFT ATRIUM             Index        RIGHT ATRIUM           Index LA Vol (A2C):   68.7 ml 36.28 ml/m  RA Area:     16.30 cm LA Vol (A4C):   39.7 ml 20.97 ml/m  RA  Volume:   49.60 ml  26.20 ml/m LA Biplane Vol: 55.3 ml 29.21 ml/m  AORTIC VALVE AV Area (Vmax):    2.32 cm AV Area (Vmean):   2.55 cm AV Area (VTI):     2.53 cm AV Vmax:           149.00 cm/s AV Vmean:          101.000 cm/s AV VTI:            0.302 m AV Peak Grad:      8.9 mmHg AV Mean Grad:      5.0 mmHg LVOT Vmax:         122.00 cm/s LVOT Vmean:        91.000 cm/s LVOT VTI:          0.269 m LVOT/AV VTI ratio: 0.89  AORTA Ao Root diam: 2.50 cm MITRAL VALVE                TRICUSPID VALVE MV Area (PHT): 3.68 cm     TR Peak grad:   26.4 mmHg MV Decel Time: 206 msec     TR Vmax:        257.00 cm/s MV E velocity: 103.00 cm/s MV A velocity: 87.80 cm/s   SHUNTS MV E/A ratio:  1.17         Systemic VTI:  0.27 m                             Systemic Diam: 1.90 cm Dalton McleanMD Electronically signed by Wilfred Lacy Signature Date/Time: 07/21/2023/9:15:34 AM    Final    CT Head Wo Contrast Result Date: 07/19/2023 CLINICAL DATA:  Head trauma, minor (Age >= 65y) EXAM: CT HEAD WITHOUT CONTRAST TECHNIQUE: Contiguous axial images were obtained from the base of the skull through the vertex without intravenous contrast. RADIATION DOSE REDUCTION: This exam was performed according to the departmental dose-optimization program which includes automated exposure control, adjustment of the mA and/or kV according to patient size and/or use of iterative reconstruction technique. COMPARISON:  09/05/2022 FINDINGS: Brain: Normal anatomic configuration. Parenchymal volume loss is commensurate with the patient's age. Stable mild periventricular white matter changes are  present likely reflecting the sequela of small vessel ischemia. No abnormal intra or extra-axial mass lesion or fluid collection. No abnormal mass effect or midline shift. No evidence of acute intracranial hemorrhage or infarct. Ventricular size is normal. Cerebellum unremarkable. Vascular: No asymmetric hyperdense vasculature at the skull base. Skull: Intact Sinuses/Orbits: Paranasal sinuses are clear. Orbits are unremarkable. Other: Mastoid air cells and middle ear cavities are clear. IMPRESSION: 1. No acute intracranial hemorrhage or infarct. No calvarial fracture. 2. Stable mild senescent change. Electronically Signed   By: Helyn Numbers M.D.   On: 07/19/2023 19:55      Assessment/Plan:  INTERVAL HISTORY: CT abdomen and pelvis without intra-abdominal abscess   Principal Problem:   Enterococcus faecalis infection Active Problems:   Sleep apnea   Chronic kidney disease (CKD), stage III (moderate) (HCC)   Sepsis (HCC)   Bacteremia   Dysuria    Luis Villegas is a 77 y.o. male with E faecalis bacteremia with sepsis, rule out endocarditis  #1 E faecalis bacteremia, R/O endocarditis  CT does not show clear cut abdominal source  He does have multiple kidney stones but urine was without pyuria to suggest infection  I have paged Cards Master to arrange TEE  I have personally spent  54 minutes involved in face-to-face and non-face-to-face activities for this patient on the day of the visit. Professional time spent includes the following activities: Preparing to see the patient (review of tests), Obtaining and/or reviewing separately obtained history (admission/discharge record), Performing a medically appropriate examination and/or evaluation , Ordering medications/tests/procedures, referring and communicating with other health care professionals, Documenting clinical information in the EMR, Independently interpreting results (not separately reported), Communicating results to the  patient/family/caregiver, Counseling and educating the patient/family/caregiver and Care coordination (not separately reported).     LOS: 2 days   Acey Lav 07/21/2023, 7:35 PM

## 2023-07-22 ENCOUNTER — Encounter (HOSPITAL_COMMUNITY): Payer: Self-pay | Admitting: Internal Medicine

## 2023-07-22 ENCOUNTER — Inpatient Hospital Stay (HOSPITAL_COMMUNITY): Payer: Medicare Other | Admitting: Anesthesiology

## 2023-07-22 ENCOUNTER — Inpatient Hospital Stay (HOSPITAL_COMMUNITY): Payer: Medicare Other

## 2023-07-22 ENCOUNTER — Encounter (HOSPITAL_COMMUNITY)
Admission: EM | Disposition: A | Discharge status: Home Health | Payer: Self-pay | Source: Home / Self Care | Attending: Internal Medicine

## 2023-07-22 DIAGNOSIS — A498 Other bacterial infections of unspecified site: Secondary | ICD-10-CM | POA: Diagnosis not present

## 2023-07-22 DIAGNOSIS — R7881 Bacteremia: Secondary | ICD-10-CM | POA: Diagnosis not present

## 2023-07-22 DIAGNOSIS — E876 Hypokalemia: Secondary | ICD-10-CM

## 2023-07-22 HISTORY — PX: TRANSESOPHAGEAL ECHOCARDIOGRAM (CATH LAB): EP1270

## 2023-07-22 LAB — BASIC METABOLIC PANEL
Anion gap: 7 (ref 5–15)
BUN: 21 mg/dL (ref 8–23)
CO2: 21 mmol/L — ABNORMAL LOW (ref 22–32)
Calcium: 8.3 mg/dL — ABNORMAL LOW (ref 8.9–10.3)
Chloride: 109 mmol/L (ref 98–111)
Creatinine, Ser: 1.23 mg/dL (ref 0.61–1.24)
GFR, Estimated: >60 mL/min (ref >60)
Glucose, Bld: 109 mg/dL — ABNORMAL HIGH (ref 70–99)
Potassium: 2.9 mmol/L — ABNORMAL LOW (ref 3.5–5.1)
Sodium: 137 mmol/L (ref 135–145)

## 2023-07-22 LAB — CBC
HCT: 32.9 % — ABNORMAL LOW (ref 39.0–52.0)
Hemoglobin: 10.8 g/dL — ABNORMAL LOW (ref 13.0–17.0)
MCH: 32.6 pg (ref 26.0–34.0)
MCHC: 32.8 g/dL (ref 30.0–36.0)
MCV: 99.4 fL (ref 80.0–100.0)
Platelets: 124 10*3/uL — ABNORMAL LOW (ref 150–400)
RBC: 3.31 MIL/uL — ABNORMAL LOW (ref 4.22–5.81)
RDW: 14.2 % (ref 11.5–15.5)
WBC: 10.6 10*3/uL — ABNORMAL HIGH (ref 4.0–10.5)
nRBC: 0 % (ref 0.0–0.2)

## 2023-07-22 LAB — CULTURE, BLOOD (ROUTINE X 2): Special Requests: ADEQUATE

## 2023-07-22 LAB — HCV INTERPRETATION

## 2023-07-22 LAB — HEPATITIS B SURFACE ANTIBODY, QUANTITATIVE: Hep B S AB Quant (Post): <3.5 m[IU]/mL — ABNORMAL LOW

## 2023-07-22 LAB — HCV AB W REFLEX TO QUANT PCR: HCV Ab: NONREACTIVE

## 2023-07-22 LAB — MAGNESIUM: Magnesium: 2.1 mg/dL (ref 1.7–2.4)

## 2023-07-22 LAB — ECHO TEE

## 2023-07-22 SURGERY — TRANSESOPHAGEAL ECHOCARDIOGRAM (TEE) (CATHLAB)
Anesthesia: Monitor Anesthesia Care

## 2023-07-22 MED ORDER — PROPOFOL 500 MG/50ML IV EMUL
INTRAVENOUS | Status: DC | PRN
  Administered 2023-07-22: 80 ug/kg/min via INTRAVENOUS

## 2023-07-22 MED ORDER — POTASSIUM CHLORIDE CRYS ER 20 MEQ PO TBCR
40.0000 meq | EXTENDED_RELEASE_TABLET | ORAL | Status: AC
  Administered 2023-07-22 (×2): 40 meq via ORAL
  Filled 2023-07-22 (×2): qty 2

## 2023-07-22 MED ORDER — SODIUM CHLORIDE 0.9 % IV SOLN
2.0000 g | Freq: Four times a day (QID) | INTRAVENOUS | Status: DC
Start: 2023-07-22 — End: 2023-07-23
  Administered 2023-07-22 – 2023-07-23 (×3): 2 g via INTRAVENOUS
  Filled 2023-07-22 (×5): qty 2000

## 2023-07-22 MED ORDER — PHENYLEPHRINE 80 MCG/ML (10ML) SYRINGE FOR IV PUSH (FOR BLOOD PRESSURE SUPPORT)
PREFILLED_SYRINGE | INTRAVENOUS | Status: DC | PRN
  Administered 2023-07-22: 80 ug via INTRAVENOUS

## 2023-07-22 MED ORDER — SODIUM CHLORIDE 0.9 % IV SOLN
INTRAVENOUS | Status: DC
Start: 2023-07-22 — End: 2023-07-22
  Administered 2023-07-22: 20 mL/h via INTRAVENOUS

## 2023-07-22 MED ORDER — PROPOFOL 10 MG/ML IV BOLUS
INTRAVENOUS | Status: DC | PRN
  Administered 2023-07-22: 70 mg via INTRAVENOUS

## 2023-07-22 MED ORDER — LIDOCAINE 2% (20 MG/ML) 5 ML SYRINGE
INTRAMUSCULAR | Status: DC | PRN
  Administered 2023-07-22: 100 mg via INTRAVENOUS

## 2023-07-22 NOTE — Evaluation (Signed)
Physical Therapy Evaluation Patient Details Name: Luis Villegas MRN: [id number] DOB: [birth year] Today's Date: 07/22/2023  History of Present Illness  77 yo male admitted with sepsis. Pt also sustained a fall at home. Hx of CKD, prostate Ca  Clinical Impression  On eval, pt required Min A for mobility. He walked ~200 feet with a RW and then ~100 feet without a device. LOB x 2 posteriorly when initially rising from recliner. He is at risk for falls when mobilizing 2* impaired balance. Wife was present during session. Will recommend HHPT f/u and RW for ambulation safety. Recommend daily hallway ambulation with nursing and/or mobility team in addition to therapy sessions.         If plan is discharge home, recommend the following: A little help with walking and/or transfers;A little help with bathing/dressing/bathroom;Assistance with cooking/housework;Assist for transportation;Help with stairs or ramp for entrance   Can travel by private vehicle        Equipment Recommendations Rolling walker (2 wheels)  Recommendations for Other Services       Functional Status Assessment Patient has had a recent decline in their functional status and demonstrates the ability to make significant improvements in function in a reasonable and predictable amount of time.     Precautions / Restrictions Precautions Precautions: Fall Restrictions Weight Bearing Restrictions Per Provider Order: No      Mobility  Bed Mobility               General bed mobility comments: oob in recliner    Transfers Overall transfer level: Needs assistance Equipment used: Rolling walker (2 wheels), None Transfers: Sit to/from Stand             General transfer comment: LOB x 2 posteriorly when stood from recliner. Cues for safety, hand placement.    Ambulation/Gait Ambulation/Gait assistance: Min assist Gait Distance (Feet): 200 Feet (100) Assistive device: Rolling walker (2 wheels), None          General Gait Details: Pt walked ~200 feet x 1 with a RW, then ~100 feet without a device. Pt tolerated distance well. Fall risk  Stairs            Wheelchair Mobility     Tilt Bed    Modified Rankin (Stroke Patients Only)       Balance Overall balance assessment: Needs assistance, History of Falls           Standing balance-Leahy Scale: Fair                               Pertinent Vitals/Pain Pain Assessment Pain Assessment: No/denies pain    Home Living Family/patient expects to be discharged to:: Private residence Living Arrangements: Spouse/significant other Available Help at Discharge: Family Type of Home: House Home Access: Stairs to enter   Secretary/administrator of Steps: 2   Home Layout: One level Home Equipment: None      Prior Function Prior Level of Function : Independent/Modified Independent                     Extremity/Trunk Assessment   Upper Extremity Assessment Upper Extremity Assessment: Defer to OT evaluation    Lower Extremity Assessment Lower Extremity Assessment: Generalized weakness    Cervical / Trunk Assessment Cervical / Trunk Assessment: Normal  Communication   Communication Communication: No apparent difficulties  Cognition Arousal: Alert Behavior During Therapy: WFL for tasks assessed/performed Overall Cognitive Status:  Within Functional Limits for tasks assessed                                          General Comments      Exercises     Assessment/Plan    PT Assessment Patient needs continued PT services  PT Problem List Decreased strength;Decreased activity tolerance;Decreased balance;Decreased mobility;Decreased knowledge of use of DME       PT Treatment Interventions DME instruction;Gait training;Functional mobility training;Stair training;Therapeutic activities;Therapeutic exercise;Patient/family education;Balance training    PT Goals (Current goals  can be found in the Care Plan section)  Acute Rehab PT Goals Patient Stated Goal: home soon. regain plof. PT Goal Formulation: With patient/family Time For Goal Achievement: 08/05/23 Potential to Achieve Goals: Good    Frequency Min 1X/week     Co-evaluation               AM-PAC PT "6 Clicks" Mobility  Outcome Measure Help needed turning from your back to your side while in a flat bed without using bedrails?: A Little Help needed moving from lying on your back to sitting on the side of a flat bed without using bedrails?: A Little Help needed moving to and from a bed to a chair (including a wheelchair)?: A Little Help needed standing up from a chair using your arms (e.g., wheelchair or bedside chair)?: A Little Help needed to walk in hospital room?: A Little Help needed climbing 3-5 steps with a railing? : A Little 6 Click Score: 18    End of Session Equipment Utilized During Treatment: Gait belt Activity Tolerance: Patient tolerated treatment well Patient left: in chair;with call bell/phone within reach;with family/visitor present   PT Visit Diagnosis: History of falling (Z91.81);Muscle weakness (generalized) (M62.81);Difficulty in walking, not elsewhere classified (R26.2)    Time: 5284-1324 PT Time Calculation (min) (ACUTE ONLY): 17 min   Charges:   PT Evaluation $PT Eval Low Complexity: 1 Low   PT General Charges $$ ACUTE PT VISIT: 1 Visit           Faye Ramsay, PT Acute Rehabilitation  Office: [phone]

## 2023-07-22 NOTE — Progress Notes (Signed)
   [facility] has been requested to perform a transesophageal echocardiogram on Luis Villegas for bacteremia.  After careful review of history and examination, the risks and benefits of transesophageal echocardiogram have been explained including risks of esophageal damage, perforation (1:10,000 risk), bleeding, pharyngeal hematoma as well as other potential complications associated with conscious sedation including aspiration, arrhythmia, respiratory failure and death. Alternatives to treatment were discussed, questions were answered. Patient is willing to proceed.   Abagail Kitchens, PA-C  07/22/2023 7:39 AM

## 2023-07-22 NOTE — Transfer of Care (Signed)
Immediate Anesthesia Transfer of Care Note  Patient: Luis Villegas  Procedure(s) Performed: TRANSESOPHAGEAL ECHOCARDIOGRAM  Patient Location: PACU and Cath Lab  Anesthesia Type:MAC  Level of Consciousness: drowsy and responds to stimulation  Airway & Oxygen Therapy: Patient Spontanous Breathing and Patient connected to nasal cannula oxygen  Post-op Assessment: Report given to RN and Post -op Vital signs reviewed and stable  Post vital signs: Reviewed and stable  Last Vitals:  Vitals Value Taken Time  BP 119/61 07/22/23 0940  Temp    Pulse 64 07/22/23 0940  Resp 19 07/22/23 0940  SpO2 93 % 07/22/23 0940  Vitals shown include unfiled device data.  Last Pain:  Vitals:   07/22/23 0837  TempSrc: Temporal  PainSc:          Complications: No notable events documented.

## 2023-07-22 NOTE — Progress Notes (Signed)
         Date: 07/22/2023  Patient name: Luis Villegas  Medical record number: [id number]  Date of birth: 1946-06-09   TEE is clean which is great news   Acey Lav 07/22/2023, 4:51 PM

## 2023-07-22 NOTE — Progress Notes (Signed)
Transported back to [employer] via [employer]

## 2023-07-22 NOTE — Anesthesia Postprocedure Evaluation (Signed)
Anesthesia Post Note  Patient: Luis Villegas  Procedure(s) Performed: TRANSESOPHAGEAL ECHOCARDIOGRAM     Patient location during evaluation: Cath Lab Anesthesia Type: MAC Level of consciousness: awake and alert, patient cooperative and oriented Pain management: pain level controlled Vital Signs Assessment: post-procedure vital signs reviewed and stable Respiratory status: nonlabored ventilation, spontaneous breathing and respiratory function stable Cardiovascular status: blood pressure returned to baseline and stable Postop Assessment: no apparent nausea or vomiting Anesthetic complications: no   No notable events documented.  Last Vitals:  Vitals:   07/22/23 0940 07/22/23 0945  BP: 119/61 (!) 131/59  Pulse: 65 69  Resp: 17 19  Temp: 37.1 C   SpO2: 93% 95%    Last Pain:  Vitals:   07/22/23 0940  TempSrc: Temporal  PainSc: 0-No pain                 Merit Maybee,E. Filmore Molyneux

## 2023-07-22 NOTE — Anesthesia Preprocedure Evaluation (Signed)
Anesthesia Evaluation  Patient identified by MRN, date of birth, ID band Patient awake    Reviewed: Allergy & Precautions, NPO status , Patient's Chart, lab work & pertinent test results  History of Anesthesia Complications Negative for: history of anesthetic complications  Airway Mallampati: II  TM Distance: >3 FB Neck ROM: Full    Dental  (+) Partial Upper, Dental Advisory Given   Pulmonary sleep apnea and Continuous Positive Airway Pressure Ventilation , former smoker   breath sounds clear to auscultation       Cardiovascular (-) hypertension(-) angina  Rhythm:Regular Rate:Normal  07/21/2023 ECHO: EF 55%.  1. The LV has normal function, no regional wall motion abnormalities. Left ventricular diastolic parameters were normal.   2. RVF is normal. The right ventricular size is normal. There is normal pulmonary artery systolic pressure. The estimated right ventricular systolic pressure is 29.4 mmHg.   3. The mitral valve is normal in structure. No evidence of MR. No evidence of mitral stenosis.   4. The aortic valve is tricuspid. There is mild calcification of the aortic valve. Aortic valve regurgitation is not visualized. No aortic stenosis is present.     Neuro/Psych  Headaches  Anxiety Depression       GI/Hepatic Neg liver ROS,GERD  Medicated and Controlled,,  Endo/Other  negative endocrine ROS    Renal/GU Renal InsufficiencyRenal disease   H/o prostate cancer    Musculoskeletal   Abdominal   Peds  Hematology  (+) Blood dyscrasia (Hb 10.8, plt 124k), anemia   Anesthesia Other Findings   Reproductive/Obstetrics                             Anesthesia Physical Anesthesia Plan  ASA: 3  Anesthesia Plan: MAC   Post-op Pain Management: Minimal or no pain anticipated   Induction:   PONV Risk Score and Plan: 1 and Treatment may vary due to age or medical condition  Airway Management  Planned: Nasal Cannula and Natural Airway  Additional Equipment: None  Intra-op Plan:   Post-operative Plan:   Informed Consent: I have reviewed the patients History and Physical, chart, labs and discussed the procedure including the risks, benefits and alternatives for the proposed anesthesia with the patient or authorized representative who has indicated his/her understanding and acceptance.     Dental advisory given  Plan Discussed with: CRNA and Surgeon  Anesthesia Plan Comments:        Anesthesia Quick Evaluation

## 2023-07-22 NOTE — CV Procedure (Signed)
TEE: Anesthesia: Propofol  No SBE/vegetations AV sclerosis Trivial MR Trivial TR/PR EF 60% Small PFO Normal RV No effusion No LAA thrombus  Charlton Haws MD [facility]

## 2023-07-22 NOTE — Interval H&P Note (Signed)
History and Physical Interval Note:  07/22/2023 9:06 AM  Luis Villegas  has presented today for surgery, with the diagnosis of BACTERIMIA.  The various methods of treatment have been discussed with the patient and family. After consideration of risks, benefits and other options for treatment, the patient has consented to  Procedure(s): TRANSESOPHAGEAL ECHOCARDIOGRAM (N/A) as a surgical intervention.  The patient's history has been reviewed, patient examined, no change in status, stable for surgery.  I have reviewed the patient's chart and labs.  Questions were answered to the patient's satisfaction.     Charlton Haws

## 2023-07-22 NOTE — Progress Notes (Addendum)
PROGRESS NOTE   Mohmed Haspel  WUJ:[id number]    DOB: 1946-04-01    DOA: 07/19/2023  PCP: Deeann Saint, MD   I have briefly reviewed patients previous medical records in [facility].  Chief Complaint  Patient presents with   [facility] Course:  77 year old married male, independent, with medical history significant for chronic kidney disease stage IIIa, chronic diarrhea, prostate cancer, sustained a fall at home.  His wife was unable to get him up and therefore called EMS and he was brought to the ED.  In ED, febrile to 102.2 F.  No reported fevers at home.  Complains of burning micturition.  Never had UTI in the past.  COVID, RSV and flu testing negative.  Chest x-ray and UA unremarkable.  Due to confusion, LP was performed.  Admitted for severe sepsis due to Enterococcus faecalis bacteremia, ID on board, TTE/TEE without vegetations and hence endocarditis ruled out.  Improving.   Assessment & Plan:  Principal Problem:   Enterococcus faecalis infection Active Problems:   Sleep apnea   Chronic kidney disease (CKD), stage III (moderate) (HCC)   Sepsis (HCC)   Bacteremia   Dysuria   Kidney stone   Enterococcus faecalis bacteremia with severe sepsis, endocarditis ruled out. -LP was not consistent with an underlying infection -The source is not immediately obvious.  CT A/P with contrast 12/17: Some perinephric stranding bilaterally but no evidence to suggest obvious pyelonephritis by delayed imaging.  Urine microscopy not suggestive of UTI.?  Urinary source -IV ceftriaxone 2 g x 1, IV vancomycin x 1.  IV Unasyn 12/16 > 12/18, IV ampicillin 12/18 > -Surveillance blood cultures from 12/16, negative to date. -Transthoracic echo - No vegetations noted-EF is 50% -He continues to be febrile-will obtain a CT of the abdomen and pelvis to see if there is any focus of infection there-according to the patient and his wife, he has chronic diarrhea and had 2 BMs  yesterday which is not unusual - Hepatitis and HIV serologies negative.  Does not appear to be immune to hepatitis B. -Slowly improving.  No high fevers for greater than 24 hours.  Leukocytosis resolved. -TTE 12/18 negative for vegetations -Pending continued improvement, remaining afebrile, surveillance cultures negative, possible PICC line placement on 12/20 and discharged home.  Hypokalemia Replace aggressively and follow.  Check and replace magnesium as needed   Acute metabolic encephalopathy - Secondary to infectious etiology above Resolved   Mild thrombocytopenia - Secondary to sepsis.  Relatively stable.  Continue to trend daily CBCs.  Anemia Dilutional from IV fluids and due to sepsis Stable Follow CBCs   History of prostate cancer - status post prostatectomy and XRT   History of chronic diarrhea - According to his wife, he takes at least 2 tabs of Imodium daily and sometimes more - As needed Imodium CT A/P without acute findings.   Chronic kidney disease stage IIIa Stable.  His creatinine today appears to be better than his recent baseline.  Nonobstructing renal calculi and an 8 mm bladder calculi Noted on CT A/P with contrast 12/17  Physical deconditioning/weakness PT consulted.  Body mass index is 25.41 kg/m.   DVT prophylaxis: enoxaparin (LOVENOX) injection 40 mg Start: 07/20/23 2000     Code Status: Full Code:  Family Communication: Spouse at bedside. Disposition:  Status is: Inpatient Remains inpatient appropriate because: IV antibiotics pending further improvement     Consultants:   Infectious disease Cardiology  Procedures:   TEE 12/18  Antimicrobials:   As above   Subjective:  Seen along with spouse at bedside.  Reports generalized weakness and has not been up on his feet except pivoting from bed to chair.  Denies any other complaints.  Objective:   Vitals:   07/22/23 1005 07/22/23 1010 07/22/23 1015 07/22/23 1347  BP: 135/61 136/65  136/67 128/70  Pulse: 70 68 72 73  Resp: 19 18 19    Temp:    98.6 F (37 C)  TempSrc:      SpO2: 94% 95% 95% 98%  Weight:      Height:        General exam: Elderly male, moderately built and nourished sitting up comfortably in reclining chair without distress.  Appears to be in good spirits.  Does not look septic or toxic. Respiratory system: Clear to auscultation. Respiratory effort normal. Cardiovascular system: S1 & S2 heard, RRR. No JVD, murmurs, rubs, gallops or clicks. No pedal edema.  Telemetry personally reviewed: Sinus rhythm. Gastrointestinal system: Abdomen is nondistended, soft and nontender. No organomegaly or masses felt. Normal bowel sounds heard. Central nervous system: Alert and oriented. No focal neurological deficits. Extremities: Symmetric 5 x 5 power. Skin: No rashes, lesions or ulcers Psychiatry: Judgement and insight appear normal. Mood & affect appropriate.     Data Reviewed:   I have personally reviewed following labs and imaging studies   CBC: Recent Labs  Lab 07/19/23 1916 07/20/23 0527 07/21/23 0548 07/22/23 0538  WBC 13.3* 16.2* 15.6* 10.6*  NEUTROABS 11.1*  --   --   --   HGB 12.2* 11.1* 11.2* 10.8*  HCT 38.2* 35.0* 32.9* 32.9*  MCV 101.1* 102.0* 99.1 99.4  PLT 182 155 133* 124*    Basic Metabolic Panel: Recent Labs  Lab 07/19/23 1916 07/20/23 0527 07/22/23 0538  NA 136 135 137  K 3.8 3.5 2.9*  CL 106 107 109  CO2 24 20* 21*  GLUCOSE 100* 103* 109*  BUN 27* 23 21  CREATININE 1.49* 1.34* 1.23  CALCIUM 9.2 8.8* 8.3*    Liver Function Tests: No results for input(s): "AST", "ALT", "ALKPHOS", "BILITOT", "PROT", "ALBUMIN" in the last 168 hours.  CBG: Recent Labs  Lab 07/20/23 2317  GLUCAP 124*    Microbiology Studies:   Recent Results (from the past 240 hours)  Blood Culture (routine x 2)     Status: Abnormal   Collection Time: 07/19/23  7:21 PM   Specimen: Right Antecubital; Blood  Result Value Ref Range Status    Specimen Description   Final    RIGHT ANTECUBITAL BOTTLES DRAWN AEROBIC AND ANAEROBIC Performed at [facility], [address], [place], [place] [address]    Special Requests   Final    Blood Culture adequate volume Performed at [facility], [address], [place], [place] [address]    Culture  Setup Time   Final    GRAM POSITIVE COCCI IN BOTH AEROBIC AND ANAEROBIC BOTTLES CRITICAL RESULT CALLED TO, READ BACK BY AND VERIFIED WITH: PHARMD D.WOFFORD AT 07/20/2023 BY T.SAAD. Performed at [facility], [address], [place], [place] [address]    Culture ENTEROCOCCUS FAECALIS (A)  Final   Report Status 07/22/2023 FINAL  Final   Organism ID, Bacteria ENTEROCOCCUS FAECALIS  Final      Susceptibility   Enterococcus faecalis - MIC*    AMPICILLIN <=2 SENSITIVE Sensitive     VANCOMYCIN 2 SENSITIVE Sensitive     GENTAMICIN SYNERGY SENSITIVE Sensitive     *  ENTEROCOCCUS FAECALIS  Blood Culture ID Panel (Reflexed)     Status: Abnormal   Collection Time: 07/19/23  7:21 PM  Result Value Ref Range Status   Enterococcus faecalis DETECTED (A) NOT DETECTED Final    Comment: CRITICAL RESULT CALLED TO, READ BACK BY AND VERIFIED WITH: PHARMD D.WOFFORD AT 07/20/2023 BY T.SAAD.    Enterococcus Faecium NOT DETECTED NOT DETECTED Final   Listeria monocytogenes NOT DETECTED NOT DETECTED Final   Staphylococcus species NOT DETECTED NOT DETECTED Final   Staphylococcus aureus (BCID) NOT DETECTED NOT DETECTED Final   Staphylococcus epidermidis NOT DETECTED NOT DETECTED Final   Staphylococcus lugdunensis NOT DETECTED NOT DETECTED Final   Streptococcus species NOT DETECTED NOT DETECTED Final   Streptococcus agalactiae NOT DETECTED NOT DETECTED Final   Streptococcus pneumoniae NOT DETECTED NOT DETECTED Final   Streptococcus pyogenes NOT DETECTED NOT DETECTED Final   A.calcoaceticus-baumannii NOT DETECTED NOT DETECTED Final   Bacteroides fragilis NOT DETECTED NOT  DETECTED Final   Enterobacterales NOT DETECTED NOT DETECTED Final   Enterobacter cloacae complex NOT DETECTED NOT DETECTED Final   Escherichia coli NOT DETECTED NOT DETECTED Final   Klebsiella aerogenes NOT DETECTED NOT DETECTED Final   Klebsiella oxytoca NOT DETECTED NOT DETECTED Final   Klebsiella pneumoniae NOT DETECTED NOT DETECTED Final   Proteus species NOT DETECTED NOT DETECTED Final   Salmonella species NOT DETECTED NOT DETECTED Final   Serratia marcescens NOT DETECTED NOT DETECTED Final   Haemophilus influenzae NOT DETECTED NOT DETECTED Final   Neisseria meningitidis NOT DETECTED NOT DETECTED Final   Pseudomonas aeruginosa NOT DETECTED NOT DETECTED Final   Stenotrophomonas maltophilia NOT DETECTED NOT DETECTED Final   Candida albicans NOT DETECTED NOT DETECTED Final   Candida auris NOT DETECTED NOT DETECTED Final   Candida glabrata NOT DETECTED NOT DETECTED Final   Candida krusei NOT DETECTED NOT DETECTED Final   Candida parapsilosis NOT DETECTED NOT DETECTED Final   Candida tropicalis NOT DETECTED NOT DETECTED Final   Cryptococcus neoformans/gattii NOT DETECTED NOT DETECTED Final   Vancomycin resistance NOT DETECTED NOT DETECTED Final    Comment: Performed at [facility], [address] [place], [place] [address]  Resp panel by RT-PCR (RSV, Flu A&B, Covid) Anterior Nasal Swab     Status: None   Collection Time: 07/19/23  7:43 PM   Specimen: Anterior Nasal Swab  Result Value Ref Range Status   SARS Coronavirus 2 by RT PCR NEGATIVE NEGATIVE Final    Comment: (NOTE) SARS-CoV-2 target nucleic acids are NOT DETECTED.  The SARS-CoV-2 RNA is generally detectable in upper respiratory specimens during the acute phase of infection. The lowest concentration of SARS-CoV-2 viral copies this assay can detect is 138 copies/mL. A negative result does not preclude SARS-Cov-2 infection and should not be used as the sole basis for treatment or other patient management decisions. A  negative result may occur with  improper specimen collection/handling, submission of specimen other than nasopharyngeal swab, presence of viral mutation(s) within the areas targeted by this assay, and inadequate number of viral copies(<138 copies/mL). A negative result must be combined with clinical observations, patient history, and epidemiological information. The expected result is Negative.  Fact Sheet for Patients:  BloggerCourse.com  Fact Sheet for Healthcare Providers:  SeriousBroker.it  This test is no t yet approved or cleared by the [place] FDA and  has been authorized for detection and/or diagnosis of SARS-CoV-2 by FDA under an Emergency Use Authorization (EUA). This EUA will remain  in effect (  meaning this test can be used) for the duration of the COVID-19 declaration under Section 564(b)(1) of the Act, 21 U.S.C.section 360bbb-3(b)(1), unless the authorization is terminated  or revoked sooner.       Influenza A by PCR NEGATIVE NEGATIVE Final   Influenza B by PCR NEGATIVE NEGATIVE Final    Comment: (NOTE) The Xpert Xpress SARS-CoV-2/FLU/RSV plus assay is intended as an aid in the diagnosis of influenza from Nasopharyngeal swab specimens and should not be used as a sole basis for treatment. Nasal washings and aspirates are unacceptable for Xpert Xpress SARS-CoV-2/FLU/RSV testing.  Fact Sheet for Patients: BloggerCourse.com  Fact Sheet for Healthcare Providers: SeriousBroker.it  This test is not yet approved or cleared by the [place] FDA and has been authorized for detection and/or diagnosis of SARS-CoV-2 by FDA under an Emergency Use Authorization (EUA). This EUA will remain in effect (meaning this test can be used) for the duration of the COVID-19 declaration under Section 564(b)(1) of the Act, 21 U.S.C. section 360bbb-3(b)(1), unless the authorization  is terminated or revoked.     Resp Syncytial Virus by PCR NEGATIVE NEGATIVE Final    Comment: (NOTE) Fact Sheet for Patients: BloggerCourse.com  Fact Sheet for Healthcare Providers: SeriousBroker.it  This test is not yet approved or cleared by the [place] FDA and has been authorized for detection and/or diagnosis of SARS-CoV-2 by FDA under an Emergency Use Authorization (EUA). This EUA will remain in effect (meaning this test can be used) for the duration of the COVID-19 declaration under Section 564(b)(1) of the Act, 21 U.S.C. section 360bbb-3(b)(1), unless the authorization is terminated or revoked.  Performed at [facility], [address], [place], [place] [address]   Blood Culture (routine x 2)     Status: Abnormal   Collection Time: 07/19/23  8:00 PM   Specimen: BLOOD RIGHT ARM  Result Value Ref Range Status   Specimen Description   Final    BLOOD RIGHT ARM BOTTLES DRAWN AEROBIC AND ANAEROBIC Performed at [facility], [address], [place], [place] [address]    Special Requests   Final    Blood Culture results may not be optimal due to an inadequate volume of blood received in culture bottles Performed at [facility], [address], [place], [place] [address]    Culture  Setup Time   Final    GRAM POSITIVE COCCI AEROBIC BOTTLE ONLY CRITICAL VALUE NOTED.  VALUE IS CONSISTENT WITH PREVIOUSLY REPORTED AND CALLED VALUE.    Culture (A)  Final    ENTEROCOCCUS FAECALIS SUSCEPTIBILITIES PERFORMED ON PREVIOUS CULTURE WITHIN THE LAST 5 DAYS. Performed at [facility], [address] [place], [place] [address]    Report Status 07/22/2023 FINAL  Final  CSF culture w Gram Stain     Status: None (Preliminary result)   Collection Time: 07/19/23 10:39 PM   Specimen: CSF; Cerebrospinal Fluid  Result Value Ref Range Status   Specimen Description   Final     CSF Performed at [facility], [address] [place], [place] [address]    Special Requests   Final    CSF Performed at [facility], [address] [place], [place] [address]    Gram Stain NO WBC SEEN NO ORGANISMS SEEN CYTOSPIN SMEAR   Final   Culture   Final    NO GROWTH 2 DAYS Performed at [facility], [address] [place], [place] [address]  Report Status PENDING  Incomplete  Culture, blood (Routine X 2) w Reflex to ID Panel     Status: None (Preliminary result)   Collection Time: 07/21/23  5:37 AM   Specimen: BLOOD LEFT ARM  Result Value Ref Range Status   Specimen Description   Final    BLOOD LEFT ARM Performed at [facility], [address] [place], [place] [address]    Special Requests   Final    BOTTLES DRAWN AEROBIC AND ANAEROBIC Blood Culture results may not be optimal due to an inadequate volume of blood received in culture bottles Performed at [facility], [address] [place], [place] [address]    Culture   Final    NO GROWTH < 24 HOURS Performed at [facility], [address] [place], [place] [address]    Report Status PENDING  Incomplete  Culture, blood (Routine X 2) w Reflex to ID Panel     Status: None (Preliminary result)   Collection Time: 07/21/23  5:48 AM   Specimen: BLOOD LEFT ARM  Result Value Ref Range Status   Specimen Description   Final    BLOOD LEFT ARM Performed at [facility], [address], [place], [place] [address]    Special Requests   Final    BOTTLES DRAWN AEROBIC AND ANAEROBIC Blood Culture results may not be optimal due to an inadequate volume of blood received in culture bottles Performed at [facility], [address] [place], [place] [address]    Culture   Final    NO GROWTH < 24 HOURS Performed at [facility], [address] [place], [place] [address]    Report Status PENDING  Incomplete     Radiology Studies:  ECHO TEE Result Date: 07/22/2023    TRANSESOPHOGEAL ECHO REPORT   Patient Name:   Nabil Curless Date of Exam: 07/22/2023 Medical Rec #:  [id number]                Height:       68.0 in Accession #:    [id number]               Weight:       167.1 lb Date of Birth:  1945/12/09                BSA:          1.893 m Patient Age:    77 years                 BP:           149/77 mmHg Patient Gender: M                        HR:           82 bpm. Exam Location:  Inpatient Procedure: Transesophageal Echo, Color Doppler, Cardiac Doppler and 3D Echo Indications:     Bacteremia  History:         Patient has prior history of Echocardiogram examinations, most                  recent 07/21/2023. Risk Factors:Dyslipidemia and Sleep Apnea.  Sonographer:     Irving Burton Senior RDCS Referring Phys:  Charlton Haws, C Diagnosing Phys: Charlton Haws MD PROCEDURE: After discussion of the risks and benefits of a TEE, an informed consent was obtained from the patient. The transesophogeal  probe was passed without difficulty through the esophogus of the patient. Sedation performed by different physician. The patient was monitored while under deep sedation. Anesthestetic sedation was provided intravenously by Anesthesiology: 100mg  of Propofol, 125mg  of Lidocaine. The patient's vital signs; including heart rate, blood pressure, and oxygen saturation; remained stable throughout the procedure. The patient developed no complications during the procedure.  IMPRESSIONS  1. Left ventricular ejection fraction, by estimation, is 60 to 65%. The left ventricle has normal function. The left ventricle has no regional wall motion abnormalities.  2. Right ventricular systolic function is normal. The right ventricular size is normal.  3. No left atrial/left atrial appendage thrombus was detected.  4. The mitral valve is normal in structure. Trivial mitral valve regurgitation. No evidence of mitral stenosis.  5. The aortic valve  is normal in structure. There is moderate calcification of the aortic valve. There is moderate thickening of the aortic valve. Aortic valve regurgitation is not visualized. Aortic valve sclerosis is present, with no evidence of aortic valve stenosis.  6. The inferior vena cava is normal in size with greater than 50% respiratory variability, suggesting right atrial pressure of 3 mmHg.  7. Mobile septum with small PFO. Conclusion(s)/Recommendation(s): Normal biventricular function without evidence of hemodynamically significant valvular heart disease. FINDINGS  Left Ventricle: Left ventricular ejection fraction, by estimation, is 60 to 65%. The left ventricle has normal function. The left ventricle has no regional wall motion abnormalities. The left ventricular internal cavity size was normal in size. There is  no left ventricular hypertrophy. Right Ventricle: The right ventricular size is normal. No increase in right ventricular wall thickness. Right ventricular systolic function is normal. Left Atrium: Left atrial size was normal in size. No left atrial/left atrial appendage thrombus was detected. Right Atrium: Right atrial size was normal in size. Pericardium: There is no evidence of pericardial effusion. Mitral Valve: The mitral valve is normal in structure. Trivial mitral valve regurgitation. No evidence of mitral valve stenosis. There is no evidence of mitral valve vegetation. Tricuspid Valve: The tricuspid valve is normal in structure. Tricuspid valve regurgitation is not demonstrated. No evidence of tricuspid stenosis. There is no evidence of tricuspid valve vegetation. Aortic Valve: The aortic valve is normal in structure. There is moderate calcification of the aortic valve. There is moderate thickening of the aortic valve. Aortic valve regurgitation is not visualized. Aortic valve sclerosis is present, with no evidence of aortic valve stenosis. There is no evidence of aortic valve vegetation. Pulmonic Valve:  The pulmonic valve was normal in structure. Pulmonic valve regurgitation is not visualized. No evidence of pulmonic stenosis. There is no evidence of pulmonic valve vegetation. Aorta: The aortic root is normal in size and structure. Venous: The inferior vena cava is normal in size with greater than 50% respiratory variability, suggesting right atrial pressure of 3 mmHg. IAS/Shunts: Mobile septum with small PFO. Additional Comments: Spectral Doppler performed. AORTIC VALVE LVOT Vmax:   86.80 cm/s LVOT Vmean:  56.000 cm/s LVOT VTI:    0.161 m  SHUNTS Systemic VTI: 0.16 m Charlton Haws MD Electronically signed by Charlton Haws MD Signature Date/Time: 07/22/2023/10:09:50 AM    Final    EP STUDY Result Date: 07/22/2023 See surgical note for result.  CT ABDOMEN PELVIS W CONTRAST Result Date: 07/21/2023 CLINICAL DATA:  Sepsis, fever, chronic diarrhea and chronic kidney disease. EXAM: CT ABDOMEN AND PELVIS WITH CONTRAST TECHNIQUE: Multidetector CT imaging of the abdomen and pelvis was performed using the standard protocol following bolus administration of  intravenous contrast. RADIATION DOSE REDUCTION: This exam was performed according to the departmental dose-optimization program which includes automated exposure control, adjustment of the mA and/or kV according to patient size and/or use of iterative reconstruction technique. CONTRAST:  OMNIPAQUE IOHEXOL 300 MG/ML  SOLN COMPARISON:  CT of the pelvis on 03/19/2007 FINDINGS: Lower chest: Trace pleural fluid at the left lung base. Hepatobiliary: No focal liver abnormality is seen. No gallstones, gallbladder wall thickening, or biliary dilatation. Pancreas: Unremarkable. No pancreatic ductal dilatation or surrounding inflammatory changes. Spleen: Normal in size without focal abnormality. Adrenals/Urinary Tract: No adrenal masses. Both kidneys contain numerous scattered small nonobstructing calculi. The biggest on the right within the upper pole measures  approximately 6 mm. The biggest on the left in the mid kidney measures approximately 6 mm. There is some perinephric stranding bilaterally but no evidence to suggest obvious pyelonephritis by delayed imaging. The bladder contains a single dependent calculus in the midline dependent lumen measuring approximately 8 mm in maximum diameter. No evidence of bladder wall thickening or diverticula. Stomach/Bowel: Bowel shows no evidence of obstruction, ileus, inflammation or lesion. The appendix is normal. No free intraperitoneal air. Vascular/Lymphatic: Atherosclerosis of the abdominal aorta and iliac arteries without aneurysm. No lymphadenopathy identified. Reproductive: Stable appearance status post prior prostatectomy. Other: Small left inguinal hernia containing fat. 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 No lesions or fractures identified. IMPRESSION: 1. Numerous scattered small nonobstructing calculi in both kidneys. There is some perinephric stranding bilaterally but no evidence to suggest obvious pyelonephritis by delayed imaging. 2. Single dependent calculus in the bladder measuring approximately 8 mm in maximum diameter. 3. Atherosclerosis of the abdominal aorta and iliac arteries without aneurysm. 4. Small left inguinal hernia containing fat. 5. 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 6. Trace pleural fluid at the left lung base. Aortic Atherosclerosis (ICD10-I70.0). Electronically Signed   By: Irish Lack M.D.   On: 07/21/2023 16:49   ECHOCARDIOGRAM COMPLETE Result Date: 07/21/2023    ECHOCARDIOGRAM REPORT   Patient Name:   WISE MOTHERWAY Date of Exam: 07/21/2023 Medical Rec #:  [id number]                Height:       68.0 in Accession #:    [id number]               Weight:       167.1 lb Date of Birth:  [birth year]                BSA:          1.893 m Patient Age:    77 years                 BP:           124/68 mmHg Patient  Gender: M                        HR:           69 bpm. Exam Location:  Inpatient Procedure: 2D Echo, Cardiac Doppler and Color Doppler Indications:    Bacteremia  History:        Patient has no prior history of Echocardiogram examinations.                 Signs/Symptoms:Murmur.  Sonographer:    Darlys Gales Referring Phys: [phone] CORNELIUS N [place] IMPRESSIONS  1. Left ventricular  ejection fraction, by estimation, is 55%. The left ventricle has normal function. The left ventricle has no regional wall motion abnormalities. Left ventricular diastolic parameters were normal.  2. Right ventricular systolic function is normal. The right ventricular size is normal. There is normal pulmonary artery systolic pressure. The estimated right ventricular systolic pressure is 29.4 mmHg.  3. The mitral valve is normal in structure. No evidence of mitral valve regurgitation. No evidence of mitral stenosis.  4. The aortic valve is tricuspid. There is mild calcification of the aortic valve. Aortic valve regurgitation is not visualized. No aortic stenosis is present.  5. The inferior vena cava is normal in size with greater than 50% respiratory variability, suggesting right atrial pressure of 3 mmHg.  6. Technically difficult study. No valvular vegetation noted but if high suspicion would need TEE. FINDINGS  Left Ventricle: Left ventricular ejection fraction, by estimation, is 55%. The left ventricle has normal function. The left ventricle has no regional wall motion abnormalities. The left ventricular internal cavity size was normal in size. There is no left ventricular hypertrophy. Left ventricular diastolic parameters were normal. Right Ventricle: The right ventricular size is normal. No increase in right ventricular wall thickness. Right ventricular systolic function is normal. There is normal pulmonary artery systolic pressure. The tricuspid regurgitant velocity is 2.57 m/s, and  with an assumed right atrial pressure of 3 mmHg,  the estimated right ventricular systolic pressure is 29.4 mmHg. Left Atrium: Left atrial size was normal in size. Right Atrium: Right atrial size was normal in size. Pericardium: There is no evidence of pericardial effusion. Mitral Valve: The mitral valve is normal in structure. No evidence of mitral valve regurgitation. No evidence of mitral valve stenosis. Tricuspid Valve: The tricuspid valve is normal in structure. Tricuspid valve regurgitation is trivial. Aortic Valve: The aortic valve is tricuspid. There is mild calcification of the aortic valve. Aortic valve regurgitation is not visualized. No aortic stenosis is present. Aortic valve mean gradient measures 5.0 mmHg. Aortic valve peak gradient measures 8.9 mmHg. Aortic valve area, by VTI measures 2.53 cm. Pulmonic Valve: The pulmonic valve was normal in structure. Pulmonic valve regurgitation is trivial. Aorta: The aortic root is normal in size and structure. Venous: The inferior vena cava is normal in size with greater than 50% respiratory variability, suggesting right atrial pressure of 3 mmHg. IAS/Shunts: No atrial level shunt detected by color flow Doppler.  LEFT VENTRICLE PLAX 2D LVIDd:         4.20 cm   Diastology LVIDs:         3.10 cm   LV e' medial:    9.57 cm/s LV PW:         0.90 cm   LV E/e' medial:  10.8 LV IVS:        0.90 cm   LV e' lateral:   9.57 cm/s LVOT diam:     1.90 cm   LV E/e' lateral: 10.8 LV SV:         76 LV SV Index:   40 LVOT Area:     2.84 cm  RIGHT VENTRICLE RV S prime:     10.30 cm/s TAPSE (M-mode): 2.8 cm LEFT ATRIUM             Index        RIGHT ATRIUM           Index LA Vol (A2C):   68.7 ml 36.28 ml/m  RA Area:     16.30 cm LA  Vol (A4C):   39.7 ml 20.97 ml/m  RA Volume:   49.60 ml  26.20 ml/m LA Biplane Vol: 55.3 ml 29.21 ml/m  AORTIC VALVE AV Area (Vmax):    2.32 cm AV Area (Vmean):   2.55 cm AV Area (VTI):     2.53 cm AV Vmax:           149.00 cm/s AV Vmean:          101.000 cm/s AV VTI:            0.302 m AV Peak  Grad:      8.9 mmHg AV Mean Grad:      5.0 mmHg LVOT Vmax:         122.00 cm/s LVOT Vmean:        91.000 cm/s LVOT VTI:          0.269 m LVOT/AV VTI ratio: 0.89  AORTA Ao Root diam: 2.50 cm MITRAL VALVE                TRICUSPID VALVE MV Area (PHT): 3.68 cm     TR Peak grad:   26.4 mmHg MV Decel Time: 206 msec     TR Vmax:        257.00 cm/s MV E velocity: 103.00 cm/s MV A velocity: 87.80 cm/s   SHUNTS MV E/A ratio:  1.17         Systemic VTI:  0.27 m                             Systemic Diam: 1.90 cm Dalton McleanMD Electronically signed by Wilfred Lacy Signature Date/Time: 07/21/2023/9:15:34 AM    Final     Scheduled Meds:    buPROPion  150 mg Oral Daily   citalopram  20 mg Oral Daily   enoxaparin (LOVENOX) injection  40 mg Subcutaneous Q24H   mirabegron ER  25 mg Oral Daily   pantoprazole  40 mg Oral Daily    Continuous Infusions:    ampicillin (OMNIPEN) IV       LOS: 3 days     Marcellus Scott, MD,  FACP, [facility], [facility], [facility]   [facility] Ad[facility] [place]      To contact the attending provider between 7A-7P or the covering provider during after hours 7P-7A, please log into the web site [email or web address] and access using universal [place] password for that web site. If you do not have the password, please call the hospital operator.  07/22/2023, 2:21 PM

## 2023-07-23 ENCOUNTER — Other Ambulatory Visit: Payer: Self-pay

## 2023-07-23 DIAGNOSIS — E876 Hypokalemia: Secondary | ICD-10-CM | POA: Diagnosis not present

## 2023-07-23 DIAGNOSIS — R7881 Bacteremia: Secondary | ICD-10-CM | POA: Diagnosis not present

## 2023-07-23 DIAGNOSIS — B952 Enterococcus as the cause of diseases classified elsewhere: Secondary | ICD-10-CM | POA: Diagnosis not present

## 2023-07-23 DIAGNOSIS — A498 Other bacterial infections of unspecified site: Secondary | ICD-10-CM | POA: Diagnosis not present

## 2023-07-23 LAB — CBC
HCT: 31.2 % — ABNORMAL LOW (ref 39.0–52.0)
Hemoglobin: 10.3 g/dL — ABNORMAL LOW (ref 13.0–17.0)
MCH: 32.7 pg (ref 26.0–34.0)
MCHC: 33 g/dL (ref 30.0–36.0)
MCV: 99 fL (ref 80.0–100.0)
Platelets: 135 10*3/uL — ABNORMAL LOW (ref 150–400)
RBC: 3.15 MIL/uL — ABNORMAL LOW (ref 4.22–5.81)
RDW: 14.5 % (ref 11.5–15.5)
WBC: 7.5 10*3/uL (ref 4.0–10.5)
nRBC: 0 % (ref 0.0–0.2)

## 2023-07-23 LAB — BASIC METABOLIC PANEL
Anion gap: 10 (ref 5–15)
BUN: 18 mg/dL (ref 8–23)
CO2: 19 mmol/L — ABNORMAL LOW (ref 22–32)
Calcium: 8.4 mg/dL — ABNORMAL LOW (ref 8.9–10.3)
Chloride: 109 mmol/L (ref 98–111)
Creatinine, Ser: 1.11 mg/dL (ref 0.61–1.24)
GFR, Estimated: >60 mL/min (ref >60)
Glucose, Bld: 100 mg/dL — ABNORMAL HIGH (ref 70–99)
Potassium: 3.2 mmol/L — ABNORMAL LOW (ref 3.5–5.1)
Sodium: 138 mmol/L (ref 135–145)

## 2023-07-23 LAB — CSF CULTURE W GRAM STAIN: Gram Stain: NONE SEEN

## 2023-07-23 MED ORDER — POTASSIUM CHLORIDE CRYS ER 20 MEQ PO TBCR
40.0000 meq | EXTENDED_RELEASE_TABLET | ORAL | Status: AC
Start: 2023-07-23 — End: 2023-07-23
  Administered 2023-07-23 (×2): 40 meq via ORAL
  Filled 2023-07-23 (×2): qty 2

## 2023-07-23 MED ORDER — SODIUM CHLORIDE 0.9% FLUSH: 10.0000 mL | INTRAVENOUS | Status: DC | PRN

## 2023-07-23 MED ORDER — SODIUM CHLORIDE 0.9 % IV SOLN
2.0000 g | INTRAVENOUS | Status: DC
  Administered 2023-07-23 – 2023-07-24 (×7): 2 g via INTRAVENOUS
  Filled 2023-07-23 (×9): qty 2000

## 2023-07-23 NOTE — TOC Initial Note (Addendum)
Transition of Care [facility]) - Initial/Assessment Note    Patient Details  Name: Luis Villegas MRN: [id number] Date of Birth: [birth year]  Transition of Care [facility]) CM/SW Contact:    Otelia Santee, LCSW Phone Number: 07/23/2023, 1:54 PM  Clinical Narrative:                 Met with pt and spouse at bedside to discuss discharge plans.  Pt is planned to return home at discharge with home IV abx and HH. Pt reports he has not had HH services before but, is agreeable to having HHPT arranged. Pt and spouse do not have preference for HHA. CMS list of HHA reviewed.  Pt also agreeable to recommendation for RW.  HHPT/RN has been arranged w/ [facility].  CSW contacted Pam w/ [employer] who will be providing home IV abx equipment and will provide education to pt and family prior to discharge.  RW has been ordered through [employer] and will be delivered to pt's room prior to discharge.   ADDENDUM: HHA changed from [facility] to [facility] due to co-pay for IV abx being waived through [place].   Expected Discharge Plan: Home w Home Health Services Barriers to Discharge: No Barriers Identified   Patient Goals and CMS Choice Patient states their goals for this hospitalization and ongoing recovery are:: To return home CMS Medicare.gov Compare Post Acute Care list provided to:: Patient Choice offered to / list presented to : Patient [place] ownership interest in [facility].provided to::  (NA)    Expected Discharge Plan and Services In-house Referral: Clinical Social Work Discharge Planning Services: NA Post Acute Care Choice: Home Health, Durable Medical Equipment Living arrangements for the past 2 months: Single Family Home                 DME Arranged: Walker rolling, IV pump/equipment DME Agency: [facility]) Date DME Agency Contacted: 07/23/23 Time DME Agency Contacted: 1352 Representative spoke with at DME Agency: RW- w/ Zack from [employer]. IV DME w/ Pam from [facility]  Arranged: PT, RN HH Agency: [facility] Date [facility] Agency Contacted: 07/23/23 Time HH Agency Contacted: 1353 Representative spoke with at [facility] Agency: Marylene Land  Prior Living Arrangements/Services Living arrangements for the past 2 months: Single Family Home Lives with:: Spouse Patient language and need for interpreter reviewed:: Yes Do you feel safe going back to the place where you live?: Yes      Need for Family Participation in Patient Care: No (Comment) Care giver support system in place?: No (comment)   Criminal Activity/Legal Involvement Pertinent to Current Situation/Hospitalization: No - Comment as needed  Activities of Daily Living   ADL Screening (condition at time of admission) Independently performs ADLs?: No Does the patient have a NEW difficulty with bathing/dressing/toileting/self-feeding that is expected to last >3 days?: Yes (Initiates electronic notice to provider for possible OT consult) Does the patient have a NEW difficulty with getting in/out of bed, walking, or climbing stairs that is expected to last >3 days?: Yes (Initiates electronic notice to provider for possible PT consult) Does the patient have a NEW difficulty with communication that is expected to last >3 days?: Yes (Initiates electronic notice to provider for possible SLP consult) Is the patient deaf or have difficulty hearing?: No Does the patient have difficulty seeing, even when wearing glasses/contacts?: No Does the patient have difficulty concentrating, remembering, or making decisions?: Yes  Permission Sought/Granted Permission sought to share information with : Facility Medical sales representative, Family Supports Permission  granted to share information with : Yes, Verbal Permission Granted  Share Information with NAME: Luis Villegas  Permission granted to share info w AGENCY: [facility]  Permission granted to share info w Relationship: Wife     Emotional Assessment Appearance:: Appears stated  age Attitude/Demeanor/Rapport: Engaged Affect (typically observed): Accepting Orientation: : Oriented to Self, Oriented to Place, Oriented to  Time, Oriented to Situation Alcohol / Substance Use: Not Applicable Psych Involvement: No (comment)  Admission diagnosis:  Sepsis (HCC) [A41.9] Sepsis with encephalopathy without septic shock, due to unspecified organism (HCC) [A41.9, R65.20, G93.41] Patient Active Problem List   Diagnosis Date Noted   Kidney stone 07/21/2023   Enterococcus faecalis infection 07/20/2023   Bacteremia 07/20/2023   Dysuria 07/20/2023   Sepsis (HCC) 07/19/2023   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   Chronic diarrhea 06/11/2022   Chronic left shoulder pain 10/15/2020   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   Chronic kidney disease (CKD), stage III (moderate) (HCC) 04/15/2019   VENTRAL HERNIA 11/23/2008   History of colonic polyps 11/23/2008   HYPERLIPIDEMIA 11/11/2007   GERD 11/11/2007   Sleep apnea 11/11/2007   PROSTATE CANCER, HX OF 11/11/2007   ANXIETY STATE NOS 05/18/2007   MIGRAINE NEC W/INTRACTABLE MIGRAINE 03/23/2007   ANEMIA-NOS 09/23/2006   PCP:  Deeann Saint, MD Pharmacy:   [facility] [phone] [place], [place] - [address] AT [facility] [address] [facility] [address] [place] [address] Phone: [phone] Fax: [phone]     Social Drivers of Health (SDOH) Social History: SDOH Screenings   Food Insecurity: No Food Insecurity (07/20/2023)  Housing: Low Risk  (07/20/2023)  Transportation Needs: No Transportation Needs (07/20/2023)  Utilities: Not At Risk (07/20/2023)  Alcohol Screen: Low Risk  (05/03/2021)  Depression (PHQ2-9): Low Risk  (06/01/2023)  Financial Resource Strain: Low Risk  (05/28/2023)  Physical Activity: Unknown (05/28/2023)  Social Connections: Unknown (05/28/2023)  Stress: Stress Concern Present (05/28/2023)  Tobacco Use: Medium Risk (07/22/2023)  Health Literacy: Adequate Health Literacy (06/01/2023)    SDOH Interventions:     Readmission Risk Interventions    07/23/2023    1:50 PM 07/21/2023    1:20 PM  Readmission Risk Prevention Plan  Post Dischage Appt Complete Complete  Medication Screening Complete Complete  Transportation Screening  Complete

## 2023-07-23 NOTE — Progress Notes (Signed)
PHARMACY CONSULT NOTE FOR:  OUTPATIENT  PARENTERAL ANTIBIOTIC THERAPY (OPAT)  Indication: Enterococcus faecalis bacteremia Regimen: Ampicillin 12 gm every 24 hours as a continuous infusion  End date: 08/04/23  Patient will transition to amoxicillin 1 gm TID on 08/05/23 for 2 weeks through 08/18/23.   IV antibiotic discharge orders are pended. To discharging provider:  please sign these orders via discharge navigator,  Select New Orders & click on the button choice - Manage This Unsigned Work.     Thank you for allowing pharmacy to be a part of this patient's care.  Sharin Mons, PharmD, BCPS, BCIDP Infectious Diseases Clinical Pharmacist Phone: [phone] 07/23/2023, 10:47 AM

## 2023-07-23 NOTE — TOC CM/SW Note (Signed)
 [facility] Quality rating ???? Patient survey rating ????   [facility] Quality rating ????? Patient survey rating???   [facility] [phone] Quality rating???? Patient survey rating????   [facility] [phone] Quality rating???? Patient survey rating????   [facility] [phone] Quality rating ???? Patient survey rating???   [facility] Quality rating??? Patient survey rating???   [facility] [phone] Quality rating??? Patient survey rating ????   [facility] [phone] Quality rating ????   [facility] Quality rating ??? Patient survey rating ????   [facility] Quality rating ??? Patient survey rating???   [facility] Quality rating ????? Patient survey rating ???   [facility] [phone] Quality rating ????? Patient survey rating ????

## 2023-07-23 NOTE — Progress Notes (Signed)
Subjective: No new complaints   Antibiotics:  Anti-infectives (From admission, onward)    Start     Dose/Rate Route Frequency Ordered Stop   07/23/23 1200  ampicillin (OMNIPEN) 2 g in sodium chloride 0.9 % 100 mL IVPB        2 g 300 mL/hr over 20 Minutes Intravenous Every 4 hours 07/23/23 1047     07/22/23 1415  ampicillin (OMNIPEN) 2 g in sodium chloride 0.9 % 100 mL IVPB  Status:  Discontinued        2 g 300 mL/hr over 20 Minutes Intravenous Every 6 hours 07/22/23 1316 07/23/23 1047   07/21/23 1500  vancomycin (VANCOREADY) IVPB 1250 mg/250 mL  Status:  Discontinued        1,250 mg 166.7 mL/hr over 90 Minutes Intravenous Every 24 hours 07/20/23 1410 07/20/23 1526   07/20/23 1800  Ampicillin-Sulbactam (UNASYN) 3 g in sodium chloride 0.9 % 100 mL IVPB  Status:  Discontinued        3 g 200 mL/hr over 30 Minutes Intravenous Every 6 hours 07/20/23 1526 07/22/23 1316   07/20/23 1500  Vancomycin (VANCOCIN) 1,500 mg in sodium chloride 0.9 % 500 mL IVPB  Status:  Discontinued        1,500 mg 250 mL/hr over 120 Minutes Intravenous STAT 07/20/23 1408 07/20/23 1526   07/19/23 2030  cefTRIAXone (ROCEPHIN) 2 g in sodium chloride 0.9 % 100 mL IVPB  Status:  Discontinued        2 g 200 mL/hr over 30 Minutes Intravenous Every 24 hours 07/19/23 2021 07/20/23 1526       Medications: Scheduled Meds:  buPROPion  150 mg Oral Daily   citalopram  20 mg Oral Daily   enoxaparin (LOVENOX) injection  40 mg Subcutaneous Q24H   mirabegron ER  25 mg Oral Daily   pantoprazole  40 mg Oral Daily   potassium chloride  40 mEq Oral Q4H   Continuous Infusions:  ampicillin (OMNIPEN) IV     PRN Meds:.acetaminophen **OR** acetaminophen, loperamide, ondansetron **OR** ondansetron (ZOFRAN) IV    Objective: Weight change:   Intake/Output Summary (Last 24 hours) at 07/23/2023 1142 Last data filed at 07/22/2023 1750 Gross per 24 hour  Intake 100 ml  Output --  Net 100 ml   Blood pressure  135/65, pulse 70, temperature 99.4 F (37.4 C), temperature source Oral, resp. rate 18, height 5\' 8"  (1.727 m), weight 75.8 kg, SpO2 96%. Temp:  [98.6 F (37 C)-99.4 F (37.4 C)] 99.4 F (37.4 C) (12/19 0450) Pulse Rate:  [70-73] 70 (12/19 0450) Resp:  [18-19] 18 (12/19 0450) BP: (128-139)/(65-79) 135/65 (12/19 0450) SpO2:  [96 %-98 %] 96 % (12/19 0450)  Physical Exam: Physical Exam Constitutional:      Appearance: He is well-developed.  HENT:     Head: Normocephalic and atraumatic.  Eyes:     Conjunctiva/sclera: Conjunctivae normal.  Cardiovascular:     Rate and Rhythm: Normal rate and regular rhythm.  Pulmonary:     Effort: Pulmonary effort is normal. No respiratory distress.     Breath sounds: No wheezing.  Abdominal:     General: There is no distension.     Palpations: Abdomen is soft.  Musculoskeletal:        General: Normal range of motion.     Cervical back: Normal range of motion and neck supple.  Skin:    General: Skin is warm and dry.     Findings: No  erythema or rash.  Neurological:     General: No focal deficit present.     Mental Status: He is alert and oriented to person, place, and time.  Psychiatric:        Mood and Affect: Mood normal.        Behavior: Behavior normal.        Thought Content: Thought content normal.        Judgment: Judgment normal.      CBC:    BMET Recent Labs    07/22/23 0538 07/23/23 0553  NA 137 138  K 2.9* 3.2*  CL 109 109  CO2 21* 19*  GLUCOSE 109* 100*  BUN 21 18  CREATININE 1.23 1.11  CALCIUM 8.3* 8.4*     Liver Panel  No results for input(s): "PROT", "ALBUMIN", "AST", "ALT", "ALKPHOS", "BILITOT", "BILIDIR", "IBILI" in the last 72 hours.     Sedimentation Rate No results for input(s): "ESRSEDRATE" in the last 72 hours. C-Reactive Protein No results for input(s): "CRP" in the last 72 hours.  Micro Results: Recent Results (from the past 720 hours)  Blood Culture (routine x 2)     Status: Abnormal    Collection Time: 07/19/23  7:21 PM   Specimen: Right Antecubital; Blood  Result Value Ref Range Status   Specimen Description   Final    RIGHT ANTECUBITAL BOTTLES DRAWN AEROBIC AND ANAEROBIC Performed at [facility], [address], [place], [place] [address]    Special Requests   Final    Blood Culture adequate volume Performed at [facility], [address], [place], [place] [address]    Culture  Setup Time   Final    GRAM POSITIVE COCCI IN BOTH AEROBIC AND ANAEROBIC BOTTLES CRITICAL RESULT CALLED TO, READ BACK BY AND VERIFIED WITH: PHARMD D.WOFFORD AT 07/20/2023 BY T.SAAD. Performed at [facility], [address], [place], [place] [address]    Culture ENTEROCOCCUS FAECALIS (A)  Final   Report Status 07/22/2023 FINAL  Final   Organism ID, Bacteria ENTEROCOCCUS FAECALIS  Final      Susceptibility   Enterococcus faecalis - MIC*    AMPICILLIN <=2 SENSITIVE Sensitive     VANCOMYCIN 2 SENSITIVE Sensitive     GENTAMICIN SYNERGY SENSITIVE Sensitive     * ENTEROCOCCUS FAECALIS  Blood Culture ID Panel (Reflexed)     Status: Abnormal   Collection Time: 07/19/23  7:21 PM  Result Value Ref Range Status   Enterococcus faecalis DETECTED (A) NOT DETECTED Final    Comment: CRITICAL RESULT CALLED TO, READ BACK BY AND VERIFIED WITH: PHARMD D.WOFFORD AT 07/20/2023 BY T.SAAD.    Enterococcus Faecium NOT DETECTED NOT DETECTED Final   Listeria monocytogenes NOT DETECTED NOT DETECTED Final   Staphylococcus species NOT DETECTED NOT DETECTED Final   Staphylococcus aureus (BCID) NOT DETECTED NOT DETECTED Final   Staphylococcus epidermidis NOT DETECTED NOT DETECTED Final   Staphylococcus lugdunensis NOT DETECTED NOT DETECTED Final   Streptococcus species NOT DETECTED NOT DETECTED Final   Streptococcus agalactiae NOT DETECTED NOT DETECTED Final   Streptococcus pneumoniae NOT DETECTED NOT DETECTED Final   Streptococcus pyogenes NOT DETECTED NOT DETECTED  Final   A.calcoaceticus-baumannii NOT DETECTED NOT DETECTED Final   Bacteroides fragilis NOT DETECTED NOT DETECTED Final   Enterobacterales NOT DETECTED NOT DETECTED Final   Enterobacter cloacae complex NOT DETECTED NOT DETECTED Final   Escherichia coli NOT DETECTED NOT DETECTED Final   Klebsiella aerogenes NOT DETECTED NOT DETECTED Final  Klebsiella oxytoca NOT DETECTED NOT DETECTED Final   Klebsiella pneumoniae NOT DETECTED NOT DETECTED Final   Proteus species NOT DETECTED NOT DETECTED Final   Salmonella species NOT DETECTED NOT DETECTED Final   Serratia marcescens NOT DETECTED NOT DETECTED Final   Haemophilus influenzae NOT DETECTED NOT DETECTED Final   Neisseria meningitidis NOT DETECTED NOT DETECTED Final   Pseudomonas aeruginosa NOT DETECTED NOT DETECTED Final   Stenotrophomonas maltophilia NOT DETECTED NOT DETECTED Final   Candida albicans NOT DETECTED NOT DETECTED Final   Candida auris NOT DETECTED NOT DETECTED Final   Candida glabrata NOT DETECTED NOT DETECTED Final   Candida krusei NOT DETECTED NOT DETECTED Final   Candida parapsilosis NOT DETECTED NOT DETECTED Final   Candida tropicalis NOT DETECTED NOT DETECTED Final   Cryptococcus neoformans/gattii NOT DETECTED NOT DETECTED Final   Vancomycin resistance NOT DETECTED NOT DETECTED Final    Comment: Performed at [facility], [address] [place], [place] [address]  Resp panel by RT-PCR (RSV, Flu A&B, Covid) Anterior Nasal Swab     Status: None   Collection Time: 07/19/23  7:43 PM   Specimen: Anterior Nasal Swab  Result Value Ref Range Status   SARS Coronavirus 2 by RT PCR NEGATIVE NEGATIVE Final    Comment: (NOTE) SARS-CoV-2 target nucleic acids are NOT DETECTED.  The SARS-CoV-2 RNA is generally detectable in upper respiratory specimens during the acute phase of infection. The lowest concentration of SARS-CoV-2 viral copies this assay can detect is 138 copies/mL. A negative result does not preclude  SARS-Cov-2 infection and should not be used as the sole basis for treatment or other patient management decisions. A negative result may occur with  improper specimen collection/handling, submission of specimen other than nasopharyngeal swab, presence of viral mutation(s) within the areas targeted by this assay, and inadequate number of viral copies(<138 copies/mL). A negative result must be combined with clinical observations, patient history, and epidemiological information. The expected result is Negative.  Fact Sheet for Patients:  BloggerCourse.com  Fact Sheet for Healthcare Providers:  SeriousBroker.it  This test is no t yet approved or cleared by the [place] FDA and  has been authorized for detection and/or diagnosis of SARS-CoV-2 by FDA under an Emergency Use Authorization (EUA). This EUA will remain  in effect (meaning this test can be used) for the duration of the COVID-19 declaration under Section 564(b)(1) of the Act, 21 U.S.C.section 360bbb-3(b)(1), unless the authorization is terminated  or revoked sooner.       Influenza A by PCR NEGATIVE NEGATIVE Final   Influenza B by PCR NEGATIVE NEGATIVE Final    Comment: (NOTE) The Xpert Xpress SARS-CoV-2/FLU/RSV plus assay is intended as an aid in the diagnosis of influenza from Nasopharyngeal swab specimens and should not be used as a sole basis for treatment. Nasal washings and aspirates are unacceptable for Xpert Xpress SARS-CoV-2/FLU/RSV testing.  Fact Sheet for Patients: BloggerCourse.com  Fact Sheet for Healthcare Providers: SeriousBroker.it  This test is not yet approved or cleared by the [place] FDA and has been authorized for detection and/or diagnosis of SARS-CoV-2 by FDA under an Emergency Use Authorization (EUA). This EUA will remain in effect (meaning this test can be used) for the duration of  the COVID-19 declaration under Section 564(b)(1) of the Act, 21 U.S.C. section 360bbb-3(b)(1), unless the authorization is terminated or revoked.     Resp Syncytial Virus by PCR NEGATIVE NEGATIVE Final    Comment: (NOTE) Fact Sheet for Patients: BloggerCourse.com  Fact Sheet for Healthcare Providers: SeriousBroker.it  This test is not yet approved or cleared by the [place] FDA and has been authorized for detection and/or diagnosis of SARS-CoV-2 by FDA under an Emergency Use Authorization (EUA). This EUA will remain in effect (meaning this test can be used) for the duration of the COVID-19 declaration under Section 564(b)(1) of the Act, 21 U.S.C. section 360bbb-3(b)(1), unless the authorization is terminated or revoked.  Performed at [facility], [address], [place], [place] [address]   Blood Culture (routine x 2)     Status: Abnormal   Collection Time: 07/19/23  8:00 PM   Specimen: BLOOD RIGHT ARM  Result Value Ref Range Status   Specimen Description   Final    BLOOD RIGHT ARM BOTTLES DRAWN AEROBIC AND ANAEROBIC Performed at [facility], [address], [place], [place] [address]    Special Requests   Final    Blood Culture results may not be optimal due to an inadequate volume of blood received in culture bottles Performed at [facility], [address], [place], [place] [address]    Culture  Setup Time   Final    GRAM POSITIVE COCCI AEROBIC BOTTLE ONLY CRITICAL VALUE NOTED.  VALUE IS CONSISTENT WITH PREVIOUSLY REPORTED AND CALLED VALUE.    Culture (A)  Final    ENTEROCOCCUS FAECALIS SUSCEPTIBILITIES PERFORMED ON PREVIOUS CULTURE WITHIN THE LAST 5 DAYS. Performed at [facility], [address], [place], [place] [address]    Report Status 07/22/2023 FINAL  Final  CSF culture w Gram Stain     Status: None (Preliminary result)   Collection Time:  07/19/23 10:39 PM   Specimen: CSF; Cerebrospinal Fluid  Result Value Ref Range Status   Specimen Description   Final    CSF Performed at [facility], [address] [place], [place] [address]    Special Requests   Final    CSF Performed at [facility], [address], [place], [place] [address]    Gram Stain NO WBC SEEN NO ORGANISMS SEEN CYTOSPIN SMEAR   Final   Culture   Final    NO GROWTH 2 DAYS Performed at [facility], [address] [place], [place] [address]    Report Status PENDING  Incomplete  Culture, blood (Routine X 2) w Reflex to ID Panel     Status: None (Preliminary result)   Collection Time: 07/21/23  5:37 AM   Specimen: BLOOD LEFT ARM  Result Value Ref Range Status   Specimen Description   Final    BLOOD LEFT ARM Performed at [facility], [address] [place], [place] [address]    Special Requests   Final    BOTTLES DRAWN AEROBIC AND ANAEROBIC Blood Culture results may not be optimal due to an inadequate volume of blood received in culture bottles Performed at [facility], [address], [place], [place] [address]    Culture   Final    NO GROWTH 2 DAYS Performed at [facility], [address] [place], [place] [address]    Report Status PENDING  Incomplete  Culture, blood (Routine X 2) w Reflex to ID Panel     Status: None (Preliminary result)   Collection Time: 07/21/23  5:48 AM   Specimen: BLOOD LEFT ARM  Result Value Ref Range Status   Specimen Description   Final    BLOOD  LEFT ARM Performed at [facility], [address] [place], [place] [address]    Special Requests   Final    BOTTLES DRAWN AEROBIC AND ANAEROBIC Blood Culture results may not be optimal due to an inadequate volume of blood received in culture bottles Performed at [facility], [address] [place], [place] [address]    Culture   Final    NO GROWTH 2 DAYS Performed  at [facility], [address] [place], [place] [address]    Report Status PENDING  Incomplete    Studies/Results: ECHO TEE Result Date: 07/22/2023    TRANSESOPHOGEAL ECHO REPORT   Patient Name:   Luis Villegas Date of Exam: 07/22/2023 Medical Rec #:  [id number]                Height:       68.0 in Accession #:    [id number]               Weight:       167.1 lb Date of Birth:  1946-07-09                BSA:          1.893 m Patient Age:    77 years                 BP:           149/77 mmHg Patient Gender: M                        HR:           82 bpm. Exam Location:  Inpatient Procedure: Transesophageal Echo, Color Doppler, Cardiac Doppler and 3D Echo Indications:     Bacteremia  History:         Patient has prior history of Echocardiogram examinations, most                  recent 07/21/2023. Risk Factors:Dyslipidemia and Sleep Apnea.  Sonographer:     Irving Burton Senior RDCS Referring Phys:  Charlton Haws, C Diagnosing Phys: Charlton Haws MD PROCEDURE: After discussion of the risks and benefits of a TEE, an informed consent was obtained from the patient. The transesophogeal probe was passed without difficulty through the esophogus of the patient. Sedation performed by different physician. The patient was monitored while under deep sedation. Anesthestetic sedation was provided intravenously by Anesthesiology: 100mg  of Propofol, 125mg  of Lidocaine. The patient's vital signs; including heart rate, blood pressure, and oxygen saturation; remained stable throughout the procedure. The patient developed no complications during the procedure.  IMPRESSIONS  1. Left ventricular ejection fraction, by estimation, is 60 to 65%. The left ventricle has normal function. The left ventricle has no regional wall motion abnormalities.  2. Right ventricular systolic function is normal. The right ventricular size is normal.  3. No left atrial/left atrial appendage thrombus was detected.  4. The mitral valve is normal  in structure. Trivial mitral valve regurgitation. No evidence of mitral stenosis.  5. The aortic valve is normal in structure. There is moderate calcification of the aortic valve. There is moderate thickening of the aortic valve. Aortic valve regurgitation is not visualized. Aortic valve sclerosis is present, with no evidence of aortic valve stenosis.  6. The inferior vena cava is normal in size with greater than 50% respiratory variability, suggesting right atrial pressure of 3 mmHg.  7. Mobile septum with small  PFO. Conclusion(s)/Recommendation(s): Normal biventricular function without evidence of hemodynamically significant valvular heart disease. FINDINGS  Left Ventricle: Left ventricular ejection fraction, by estimation, is 60 to 65%. The left ventricle has normal function. The left ventricle has no regional wall motion abnormalities. The left ventricular internal cavity size was normal in size. There is  no left ventricular hypertrophy. Right Ventricle: The right ventricular size is normal. No increase in right ventricular wall thickness. Right ventricular systolic function is normal. Left Atrium: Left atrial size was normal in size. No left atrial/left atrial appendage thrombus was detected. Right Atrium: Right atrial size was normal in size. Pericardium: There is no evidence of pericardial effusion. Mitral Valve: The mitral valve is normal in structure. Trivial mitral valve regurgitation. No evidence of mitral valve stenosis. There is no evidence of mitral valve vegetation. Tricuspid Valve: The tricuspid valve is normal in structure. Tricuspid valve regurgitation is not demonstrated. No evidence of tricuspid stenosis. There is no evidence of tricuspid valve vegetation. Aortic Valve: The aortic valve is normal in structure. There is moderate calcification of the aortic valve. There is moderate thickening of the aortic valve. Aortic valve regurgitation is not visualized. Aortic valve sclerosis is present, with  no evidence of aortic valve stenosis. There is no evidence of aortic valve vegetation. Pulmonic Valve: The pulmonic valve was normal in structure. Pulmonic valve regurgitation is not visualized. No evidence of pulmonic stenosis. There is no evidence of pulmonic valve vegetation. Aorta: The aortic root is normal in size and structure. Venous: The inferior vena cava is normal in size with greater than 50% respiratory variability, suggesting right atrial pressure of 3 mmHg. IAS/Shunts: Mobile septum with small PFO. Additional Comments: Spectral Doppler performed. AORTIC VALVE LVOT Vmax:   86.80 cm/s LVOT Vmean:  56.000 cm/s LVOT VTI:    0.161 m  SHUNTS Systemic VTI: 0.16 m Charlton Haws MD Electronically signed by Charlton Haws MD Signature Date/Time: 07/22/2023/10:09:50 AM    Final    EP STUDY Result Date: 07/22/2023 See surgical note for result.  CT ABDOMEN PELVIS W CONTRAST Result Date: 07/21/2023 CLINICAL DATA:  Sepsis, fever, chronic diarrhea and chronic kidney disease. EXAM: CT ABDOMEN AND PELVIS WITH CONTRAST TECHNIQUE: Multidetector CT imaging of the abdomen and pelvis was performed using the standard protocol following bolus administration of intravenous contrast. RADIATION DOSE REDUCTION: This exam was performed according to the departmental dose-optimization program which includes automated exposure control, adjustment of the mA and/or kV according to patient size and/or use of iterative reconstruction technique. CONTRAST:  OMNIPAQUE IOHEXOL 300 MG/ML  SOLN COMPARISON:  CT of the pelvis on 03/19/2007 FINDINGS: Lower chest: Trace pleural fluid at the left lung base. Hepatobiliary: No focal liver abnormality is seen. No gallstones, gallbladder wall thickening, or biliary dilatation. Pancreas: Unremarkable. No pancreatic ductal dilatation or surrounding inflammatory changes. Spleen: Normal in size without focal abnormality. Adrenals/Urinary Tract: No adrenal masses. Both kidneys contain numerous  scattered small nonobstructing calculi. The biggest on the right within the upper pole measures approximately 6 mm. The biggest on the left in the mid kidney measures approximately 6 mm. There is some perinephric stranding bilaterally but no evidence to suggest obvious pyelonephritis by delayed imaging. The bladder contains a single dependent calculus in the midline dependent lumen measuring approximately 8 mm in maximum diameter. No evidence of bladder wall thickening or diverticula. Stomach/Bowel: Bowel shows no evidence of obstruction, ileus, inflammation or lesion. The appendix is normal. No free intraperitoneal air. Vascular/Lymphatic: Atherosclerosis of the abdominal aorta and iliac  arteries without aneurysm. No lymphadenopathy identified. Reproductive: Stable appearance status post prior prostatectomy. Other: Small left inguinal hernia containing fat. 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 No lesions or fractures identified. IMPRESSION: 1. Numerous scattered small nonobstructing calculi in both kidneys. There is some perinephric stranding bilaterally but no evidence to suggest obvious pyelonephritis by delayed imaging. 2. Single dependent calculus in the bladder measuring approximately 8 mm in maximum diameter. 3. Atherosclerosis of the abdominal aorta and iliac arteries without aneurysm. 4. Small left inguinal hernia containing fat. 5. 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 6. Trace pleural fluid at the left lung base. Aortic Atherosclerosis (ICD10-I70.0). Electronically Signed   By: Irish Lack M.D.   On: 07/21/2023 16:49      Assessment/Plan:  INTERVAL HISTORY:  TEE clean   Principal Problem:   Enterococcus faecalis infection Active Problems:   Sleep apnea   Chronic kidney disease (CKD), stage III (moderate) (HCC)   Sepsis (HCC)   Bacteremia   Dysuria   Kidney stone    Luis Villegas is a 77 y.o. male  with E faecalis bacteremia with sepsis, rule out endocarditis  #1 E faecalis bacteremia,   TEE is without vegetations  We will have him finish out high dose IV AMP via continuous infusion thru 08/04/2023 and then follow this with high dose po amoxicillin thru 08/04/2023  Diagnosis: Enterococcus faecalis bacteremia   Culture Result:Enterococcus faecalis   No Active Allergies  OPAT Orders Discharge antibiotics to be given via PICC line Discharge antibiotics: Ampicillin 12 gm every 24 hours as a continuous infusion   Duration: 2 weeks End Date: 08/04/23   [facility] Care Per Protocol:  Home health RN for IV administration and teaching; PICC line care and labs.    Labs weekly while on IV antibiotics: _x_ CBC with differential _x_ BMP   x__ Please pull PIC at completion of IV antibiotics __ Please leave PIC in place until doctor has seen patient or been notified  Fax weekly labs to [phone]  Patient will transition to amoxicillin 1 gm TID on 08/05/23 for 2 weeks through 08/18/23.      Clinic Follow Up Appt:   Luis Villegas has an appointment on  09/08/2023 at 915 AM with Dr. Daiva Eves at  [facility], which  is located in the [facility] at  [address] [place] in [place].  [address], which is located to the left of the elevators.  Phone: [phone]  Fax: [phone]  [email or web address]  The patient should arrive 30 minutes prior to their appoitment.  I have personally spent 52 minutes involved in face-to-face and non-face-to-face activities for this patient on the day of the visit. Professional time spent includes the following activities: Preparing to see the patient (review of tests), Obtaining and/or reviewing separately obtained history (admission/discharge record), Performing a medically appropriate examination and/or evaluation , Ordering medications/tests/procedures, referring  and communicating with other health care professionals, Documenting clinical information in the EMR, Independently interpreting results (not separately reported), Communicating results to the patient/family/caregiver, Counseling and educating the patient/family/caregiver and Care coordination (not separately reported).   I will sign off for now please call with further questions.   LOS: 4 days   Acey Lav 07/23/2023, 11:42 AM

## 2023-07-23 NOTE — Progress Notes (Signed)
PROGRESS NOTE   Luis Villegas  ZOX:[id number]    DOB: [birth year]    DOA: 07/19/2023  PCP: Deeann Saint, MD   I have briefly reviewed patients previous medical records in [facility].  Chief Complaint  Patient presents with   [facility] Course:  77 year old married male, independent, with medical history significant for chronic kidney disease stage IIIa, chronic diarrhea, prostate cancer, sustained a fall at home.  His wife was unable to get him up and therefore called EMS and he was brought to the ED.  In ED, febrile to 102.2 F.  No reported fevers at home.  Complains of burning micturition.  Never had UTI in the past.  COVID, RSV and flu testing negative.  Chest x-ray and UA unremarkable.  Due to confusion, LP was performed.  Admitted for severe sepsis due to Enterococcus faecalis bacteremia, ID on board, TTE/TEE without vegetations and hence endocarditis ruled out.  Improving.  Surveillance blood cultures x 2 negative to date.  Pending surveillance blood cultures negative after 72 hours, needs midline placed 12/20 and DC home on IV ampicillin through 12/31 followed by oral amoxicillin high-dose through 08/18/2023   Assessment & Plan:  Principal Problem:   Enterococcus faecalis infection Active Problems:   Sleep apnea   Chronic kidney disease (CKD), stage III (moderate) (HCC)   Sepsis (HCC)   Bacteremia   Dysuria   Kidney stone   Enterococcus faecalis bacteremia with severe sepsis, endocarditis ruled out. -LP was not consistent with an underlying infection.  CSF cultures: Negative to date. -The source is not immediately obvious.  CT A/P with contrast 12/17: Some perinephric stranding bilaterally but no evidence to suggest obvious pyelonephritis by delayed imaging.  Urine microscopy not suggestive of UTI.?  Urinary source -IV ceftriaxone 2 g x 1, IV vancomycin x 1.  IV Unasyn 12/16 > 12/18, IV ampicillin 12/18 > -Surveillance blood cultures from  12/16, negative to date after 2 days. -Transthoracic echo - No vegetations noted-EF is 50% - CT of the abdomen and pelvis to see if there is any focus of infection there-according to the patient and his wife, he has chronic diarrhea and had 2 BMs yesterday which is not unusual - Hepatitis and HIV serologies negative.  Does not appear to be immune to hepatitis B. -Slowly improving.  No high fevers for greater than 48 hours.  Leukocytosis resolved. -TTE 12/18 negative for vegetations - Pending surveillance blood cultures negative after 72 hours, needs midline placed 12/20 and DC home on IV ampicillin through 12/31 followed by oral amoxicillin high-dose through 08/18/2023.  Plan as communicated with ID.  Hypokalemia Potassium continues to be low.  Continue to replace aggressively and follow.  Magnesium 2.1.   Acute metabolic encephalopathy - Secondary to infectious etiology above Resolved   Mild thrombocytopenia - Secondary to sepsis.  Relatively stable.  Continue to trend daily CBCs.  Anemia Dilutional from IV fluids and due to sepsis Stable Follow CBCs   History of prostate cancer - status post prostatectomy and XRT   History of chronic diarrhea - According to his wife, he takes at least 2 tabs of Imodium daily and sometimes more - As needed Imodium CT A/P without acute findings.   Chronic kidney disease stage IIIa Stable.  His creatinine today appears to be better than his recent baseline.  Creatinine down to 1.11.  Nonobstructing renal calculi and an 8 mm bladder calculi Noted on CT A/P with contrast 12/17  Physical  deconditioning/weakness PT consulted and recommend home health PT.  Body mass index is 25.41 kg/m.   DVT prophylaxis: enoxaparin (LOVENOX) injection 40 mg Start: 07/20/23 2000     Code Status: Full Code:  Family Communication: Spouse at bedside today. Disposition:  Status is: Inpatient Remains inpatient appropriate because: IV antibiotics pending further  improvement     Consultants:   Infectious disease Cardiology  Procedures:   TEE 12/18  Antimicrobials:   As above   Subjective:  Appetite improved.  Was barely taking bites of his food but today almost finishing up exam which this morning.  Patient and spouse told me that ID had already seen him today and suggested DC tomorrow.  No complaints.  Objective:   Vitals:   07/22/23 1347 07/22/23 1928 07/22/23 2347 07/23/23 0450  BP: 128/70 139/79  135/65  Pulse: 73 73  70  Resp:  19  18  Temp: 98.6 F (37 C) 99 F (37.2 C) 98.8 F (37.1 C) 99.4 F (37.4 C)  TempSrc:  Oral Oral Oral  SpO2: 98% 97%  96%  Weight:      Height:        General exam: Elderly male, moderately built and nourished sitting up comfortably in reclining chair without distress.  Appears to be in good spirits.  Does not look septic or toxic. Respiratory system: Clear to auscultation.  No increased work of breathing. Cardiovascular system: S1 & S2 heard, RRR. No JVD, murmurs, rubs, gallops or clicks. No pedal edema.  Telemetry personally reviewed: Sinus rhythm. Gastrointestinal system: Abdomen is nondistended, soft and nontender. No organomegaly or masses felt. Normal bowel sounds heard. Central nervous system: Alert and oriented. No focal neurological deficits. Extremities: Symmetric 5 x 5 power. Skin: No rashes, lesions or ulcers Psychiatry: Judgement and insight appear normal. Mood & affect appropriate.     Data Reviewed:   I have personally reviewed following labs and imaging studies   CBC: Recent Labs  Lab 07/19/23 1916 07/20/23 0527 07/21/23 0548 07/22/23 0538 07/23/23 0553  WBC 13.3*   < > 15.6* 10.6* 7.5  NEUTROABS 11.1*  --   --   --   --   HGB 12.2*   < > 11.2* 10.8* 10.3*  HCT 38.2*   < > 32.9* 32.9* 31.2*  MCV 101.1*   < > 99.1 99.4 99.0  PLT 182   < > 133* 124* 135*   < > = values in this interval not displayed.    Basic Metabolic Panel: Recent Labs  Lab 07/19/23 1916  07/20/23 0527 07/22/23 0538 07/23/23 0553  NA 136 135 137 138  K 3.8 3.5 2.9* 3.2*  CL 106 107 109 109  CO2 24 20* 21* 19*  GLUCOSE 100* 103* 109* 100*  BUN 27* 23 21 18   CREATININE 1.49* 1.34* 1.23 1.11  CALCIUM 9.2 8.8* 8.3* 8.4*  MG  --   --  2.1  --     Liver Function Tests: No results for input(s): "AST", "ALT", "ALKPHOS", "BILITOT", "PROT", "ALBUMIN" in the last 168 hours.  CBG: Recent Labs  Lab 07/20/23 2317  GLUCAP 124*    Microbiology Studies:   Recent Results (from the past 240 hours)  Blood Culture (routine x 2)     Status: Abnormal   Collection Time: 07/19/23  7:21 PM   Specimen: Right Antecubital; Blood  Result Value Ref Range Status   Specimen Description   Final    RIGHT ANTECUBITAL BOTTLES DRAWN AEROBIC AND ANAEROBIC Performed at [facility], [address] [place], [place] [address]    Special Requests   Final    Blood Culture adequate volume Performed at [facility], [address] [place], [place] [address]    Culture  Setup Time   Final    GRAM POSITIVE COCCI IN BOTH AEROBIC AND ANAEROBIC BOTTLES CRITICAL RESULT CALLED TO, READ BACK BY AND VERIFIED WITH: PHARMD D.WOFFORD AT 07/20/2023 BY T.SAAD. Performed at [facility], [address], [place], [place] [address]    Culture ENTEROCOCCUS FAECALIS (A)  Final   Report Status 07/22/2023 FINAL  Final   Organism ID, Bacteria ENTEROCOCCUS FAECALIS  Final      Susceptibility   Enterococcus faecalis - MIC*    AMPICILLIN <=2 SENSITIVE Sensitive     VANCOMYCIN 2 SENSITIVE Sensitive     GENTAMICIN SYNERGY SENSITIVE Sensitive     * ENTEROCOCCUS FAECALIS  Blood Culture ID Panel (Reflexed)     Status: Abnormal   Collection Time: 07/19/23  7:21 PM  Result Value Ref Range Status   Enterococcus faecalis DETECTED (A) NOT DETECTED Final    Comment: CRITICAL RESULT CALLED TO, READ BACK BY AND VERIFIED WITH: PHARMD D.WOFFORD AT 07/20/2023 BY T.SAAD.     Enterococcus Faecium NOT DETECTED NOT DETECTED Final   Listeria monocytogenes NOT DETECTED NOT DETECTED Final   Staphylococcus species NOT DETECTED NOT DETECTED Final   Staphylococcus aureus (BCID) NOT DETECTED NOT DETECTED Final   Staphylococcus epidermidis NOT DETECTED NOT DETECTED Final   Staphylococcus lugdunensis NOT DETECTED NOT DETECTED Final   Streptococcus species NOT DETECTED NOT DETECTED Final   Streptococcus agalactiae NOT DETECTED NOT DETECTED Final   Streptococcus pneumoniae NOT DETECTED NOT DETECTED Final   Streptococcus pyogenes NOT DETECTED NOT DETECTED Final   A.calcoaceticus-baumannii NOT DETECTED NOT DETECTED Final   Bacteroides fragilis NOT DETECTED NOT DETECTED Final   Enterobacterales NOT DETECTED NOT DETECTED Final   Enterobacter cloacae complex NOT DETECTED NOT DETECTED Final   Escherichia coli NOT DETECTED NOT DETECTED Final   Klebsiella aerogenes NOT DETECTED NOT DETECTED Final   Klebsiella oxytoca NOT DETECTED NOT DETECTED Final   Klebsiella pneumoniae NOT DETECTED NOT DETECTED Final   Proteus species NOT DETECTED NOT DETECTED Final   Salmonella species NOT DETECTED NOT DETECTED Final   Serratia marcescens NOT DETECTED NOT DETECTED Final   Haemophilus influenzae NOT DETECTED NOT DETECTED Final   Neisseria meningitidis NOT DETECTED NOT DETECTED Final   Pseudomonas aeruginosa NOT DETECTED NOT DETECTED Final   Stenotrophomonas maltophilia NOT DETECTED NOT DETECTED Final   Candida albicans NOT DETECTED NOT DETECTED Final   Candida auris NOT DETECTED NOT DETECTED Final   Candida glabrata NOT DETECTED NOT DETECTED Final   Candida krusei NOT DETECTED NOT DETECTED Final   Candida parapsilosis NOT DETECTED NOT DETECTED Final   Candida tropicalis NOT DETECTED NOT DETECTED Final   Cryptococcus neoformans/gattii NOT DETECTED NOT DETECTED Final   Vancomycin resistance NOT DETECTED NOT DETECTED Final    Comment: Performed at [facility], [address],  [place], [place] [address]  Resp panel by RT-PCR (RSV, Flu A&B, Covid) Anterior Nasal Swab     Status: None   Collection Time: 07/19/23  7:43 PM   Specimen: Anterior Nasal Swab  Result Value Ref Range Status   SARS Coronavirus 2 by RT PCR NEGATIVE NEGATIVE Final    Comment: (NOTE) SARS-CoV-2 target nucleic acids are NOT DETECTED.  The SARS-CoV-2 RNA is generally detectable in upper respiratory  specimens during the acute phase of infection. The lowest concentration of SARS-CoV-2 viral copies this assay can detect is 138 copies/mL. A negative result does not preclude SARS-Cov-2 infection and should not be used as the sole basis for treatment or other patient management decisions. A negative result may occur with  improper specimen collection/handling, submission of specimen other than nasopharyngeal swab, presence of viral mutation(s) within the areas targeted by this assay, and inadequate number of viral copies(<138 copies/mL). A negative result must be combined with clinical observations, patient history, and epidemiological information. The expected result is Negative.  Fact Sheet for Patients:  BloggerCourse.com  Fact Sheet for Healthcare Providers:  SeriousBroker.it  This test is no t yet approved or cleared by the [place] FDA and  has been authorized for detection and/or diagnosis of SARS-CoV-2 by FDA under an Emergency Use Authorization (EUA). This EUA will remain  in effect (meaning this test can be used) for the duration of the COVID-19 declaration under Section 564(b)(1) of the Act, 21 U.S.C.section 360bbb-3(b)(1), unless the authorization is terminated  or revoked sooner.       Influenza A by PCR NEGATIVE NEGATIVE Final   Influenza B by PCR NEGATIVE NEGATIVE Final    Comment: (NOTE) The Xpert Xpress SARS-CoV-2/FLU/RSV plus assay is intended as an aid in the diagnosis of influenza from Nasopharyngeal swab specimens  and should not be used as a sole basis for treatment. Nasal washings and aspirates are unacceptable for Xpert Xpress SARS-CoV-2/FLU/RSV testing.  Fact Sheet for Patients: BloggerCourse.com  Fact Sheet for Healthcare Providers: SeriousBroker.it  This test is not yet approved or cleared by the [place] FDA and has been authorized for detection and/or diagnosis of SARS-CoV-2 by FDA under an Emergency Use Authorization (EUA). This EUA will remain in effect (meaning this test can be used) for the duration of the COVID-19 declaration under Section 564(b)(1) of the Act, 21 U.S.C. section 360bbb-3(b)(1), unless the authorization is terminated or revoked.     Resp Syncytial Virus by PCR NEGATIVE NEGATIVE Final    Comment: (NOTE) Fact Sheet for Patients: BloggerCourse.com  Fact Sheet for Healthcare Providers: SeriousBroker.it  This test is not yet approved or cleared by the [place] FDA and has been authorized for detection and/or diagnosis of SARS-CoV-2 by FDA under an Emergency Use Authorization (EUA). This EUA will remain in effect (meaning this test can be used) for the duration of the COVID-19 declaration under Section 564(b)(1) of the Act, 21 U.S.C. section 360bbb-3(b)(1), unless the authorization is terminated or revoked.  Performed at [facility], [address] [place], [place] [address]   Blood Culture (routine x 2)     Status: Abnormal   Collection Time: 07/19/23  8:00 PM   Specimen: BLOOD RIGHT ARM  Result Value Ref Range Status   Specimen Description   Final    BLOOD RIGHT ARM BOTTLES DRAWN AEROBIC AND ANAEROBIC Performed at [facility], [address] [place], [place] [address]    Special Requests   Final    Blood Culture results may not be optimal due to an inadequate volume of blood received in culture  bottles Performed at [facility], [address], [place], [place] [address]    Culture  Setup Time   Final    GRAM POSITIVE COCCI AEROBIC BOTTLE ONLY CRITICAL VALUE NOTED.  VALUE IS CONSISTENT WITH PREVIOUSLY REPORTED AND CALLED VALUE.    Culture (A)  Final    ENTEROCOCCUS FAECALIS SUSCEPTIBILITIES  PERFORMED ON PREVIOUS CULTURE WITHIN THE LAST 5 DAYS. Performed at [facility], [address], [place], [place] [address]    Report Status 07/22/2023 FINAL  Final  CSF culture w Gram Stain     Status: None (Preliminary result)   Collection Time: 07/19/23 10:39 PM   Specimen: CSF; Cerebrospinal Fluid  Result Value Ref Range Status   Specimen Description   Final    CSF Performed at [facility], [address], [place], [place] [address]    Special Requests   Final    CSF Performed at [facility], [address] [place], [place] [address]    Gram Stain NO WBC SEEN NO ORGANISMS SEEN CYTOSPIN SMEAR   Final   Culture   Final    NO GROWTH 2 DAYS Performed at [facility], [address] [place], [place] [address]    Report Status PENDING  Incomplete  Culture, blood (Routine X 2) w Reflex to ID Panel     Status: None (Preliminary result)   Collection Time: 07/21/23  5:37 AM   Specimen: BLOOD LEFT ARM  Result Value Ref Range Status   Specimen Description   Final    BLOOD LEFT ARM Performed at [facility], [address] [place], [place] [address]    Special Requests   Final    BOTTLES DRAWN AEROBIC AND ANAEROBIC Blood Culture results may not be optimal due to an inadequate volume of blood received in culture bottles Performed at [facility], [address], [place], [place] [address]    Culture   Final    NO GROWTH 2 DAYS Performed at [facility], [address] [place], [place] [address]    Report Status PENDING  Incomplete  Culture, blood (Routine X 2) w Reflex to ID  Panel     Status: None (Preliminary result)   Collection Time: 07/21/23  5:48 AM   Specimen: BLOOD LEFT ARM  Result Value Ref Range Status   Specimen Description   Final    BLOOD LEFT ARM Performed at [facility], [address], [place], [place] [address]    Special Requests   Final    BOTTLES DRAWN AEROBIC AND ANAEROBIC Blood Culture results may not be optimal due to an inadequate volume of blood received in culture bottles Performed at [facility], [address] [place], [place] [address]    Culture   Final    NO GROWTH 2 DAYS Performed at [facility], [address] [place], [place] [address]    Report Status PENDING  Incomplete    Radiology Studies:  ECHO TEE Result Date: 07/22/2023    TRANSESOPHOGEAL ECHO REPORT   Patient Name:   Amardeep Zerba Date of Exam: 07/22/2023 Medical Rec #:  [id number]                Height:       68.0 in Accession #:    [id number]               Weight:       167.1 lb Date of Birth:  June 22, 1946                BSA:          1.893 m Patient Age:    18 years  BP:           149/77 mmHg Patient Gender: M                        HR:           82 bpm. Exam Location:  Inpatient Procedure: Transesophageal Echo, Color Doppler, Cardiac Doppler and 3D Echo Indications:     Bacteremia  History:         Patient has prior history of Echocardiogram examinations, most                  recent 07/21/2023. Risk Factors:Dyslipidemia and Sleep Apnea.  Sonographer:     Irving Burton Senior RDCS Referring Phys:  Charlton Haws, C Diagnosing Phys: Charlton Haws MD PROCEDURE: After discussion of the risks and benefits of a TEE, an informed consent was obtained from the patient. The transesophogeal probe was passed without difficulty through the esophogus of the patient. Sedation performed by different physician. The patient was monitored while under deep sedation. Anesthestetic sedation was provided intravenously by Anesthesiology: 100mg   of Propofol, 125mg  of Lidocaine. The patient's vital signs; including heart rate, blood pressure, and oxygen saturation; remained stable throughout the procedure. The patient developed no complications during the procedure.  IMPRESSIONS  1. Left ventricular ejection fraction, by estimation, is 60 to 65%. The left ventricle has normal function. The left ventricle has no regional wall motion abnormalities.  2. Right ventricular systolic function is normal. The right ventricular size is normal.  3. No left atrial/left atrial appendage thrombus was detected.  4. The mitral valve is normal in structure. Trivial mitral valve regurgitation. No evidence of mitral stenosis.  5. The aortic valve is normal in structure. There is moderate calcification of the aortic valve. There is moderate thickening of the aortic valve. Aortic valve regurgitation is not visualized. Aortic valve sclerosis is present, with no evidence of aortic valve stenosis.  6. The inferior vena cava is normal in size with greater than 50% respiratory variability, suggesting right atrial pressure of 3 mmHg.  7. Mobile septum with small PFO. Conclusion(s)/Recommendation(s): Normal biventricular function without evidence of hemodynamically significant valvular heart disease. FINDINGS  Left Ventricle: Left ventricular ejection fraction, by estimation, is 60 to 65%. The left ventricle has normal function. The left ventricle has no regional wall motion abnormalities. The left ventricular internal cavity size was normal in size. There is  no left ventricular hypertrophy. Right Ventricle: The right ventricular size is normal. No increase in right ventricular wall thickness. Right ventricular systolic function is normal. Left Atrium: Left atrial size was normal in size. No left atrial/left atrial appendage thrombus was detected. Right Atrium: Right atrial size was normal in size. Pericardium: There is no evidence of pericardial effusion. Mitral Valve: The mitral  valve is normal in structure. Trivial mitral valve regurgitation. No evidence of mitral valve stenosis. There is no evidence of mitral valve vegetation. Tricuspid Valve: The tricuspid valve is normal in structure. Tricuspid valve regurgitation is not demonstrated. No evidence of tricuspid stenosis. There is no evidence of tricuspid valve vegetation. Aortic Valve: The aortic valve is normal in structure. There is moderate calcification of the aortic valve. There is moderate thickening of the aortic valve. Aortic valve regurgitation is not visualized. Aortic valve sclerosis is present, with no evidence of aortic valve stenosis. There is no evidence of aortic valve vegetation. Pulmonic Valve: The pulmonic valve was normal in structure. Pulmonic valve regurgitation is not visualized. No evidence of  pulmonic stenosis. There is no evidence of pulmonic valve vegetation. Aorta: The aortic root is normal in size and structure. Venous: The inferior vena cava is normal in size with greater than 50% respiratory variability, suggesting right atrial pressure of 3 mmHg. IAS/Shunts: Mobile septum with small PFO. Additional Comments: Spectral Doppler performed. AORTIC VALVE LVOT Vmax:   86.80 cm/s LVOT Vmean:  56.000 cm/s LVOT VTI:    0.161 m  SHUNTS Systemic VTI: 0.16 m Charlton Haws MD Electronically signed by Charlton Haws MD Signature Date/Time: 07/22/2023/10:09:50 AM    Final    EP STUDY Result Date: 07/22/2023 See surgical note for result.  CT ABDOMEN PELVIS W CONTRAST Result Date: 07/21/2023 CLINICAL DATA:  Sepsis, fever, chronic diarrhea and chronic kidney disease. EXAM: CT ABDOMEN AND PELVIS WITH CONTRAST TECHNIQUE: Multidetector CT imaging of the abdomen and pelvis was performed using the standard protocol following bolus administration of intravenous contrast. RADIATION DOSE REDUCTION: This exam was performed according to the departmental dose-optimization program which includes automated exposure control,  adjustment of the mA and/or kV according to patient size and/or use of iterative reconstruction technique. CONTRAST:  OMNIPAQUE IOHEXOL 300 MG/ML  SOLN COMPARISON:  CT of the pelvis on 03/19/2007 FINDINGS: Lower chest: Trace pleural fluid at the left lung base. Hepatobiliary: No focal liver abnormality is seen. No gallstones, gallbladder wall thickening, or biliary dilatation. Pancreas: Unremarkable. No pancreatic ductal dilatation or surrounding inflammatory changes. Spleen: Normal in size without focal abnormality. Adrenals/Urinary Tract: No adrenal masses. Both kidneys contain numerous scattered small nonobstructing calculi. The biggest on the right within the upper pole measures approximately 6 mm. The biggest on the left in the mid kidney measures approximately 6 mm. There is some perinephric stranding bilaterally but no evidence to suggest obvious pyelonephritis by delayed imaging. The bladder contains a single dependent calculus in the midline dependent lumen measuring approximately 8 mm in maximum diameter. No evidence of bladder wall thickening or diverticula. Stomach/Bowel: Bowel shows no evidence of obstruction, ileus, inflammation or lesion. The appendix is normal. No free intraperitoneal air. Vascular/Lymphatic: Atherosclerosis of the abdominal aorta and iliac arteries without aneurysm. No lymphadenopathy identified. Reproductive: Stable appearance status post prior prostatectomy. Other: Small left inguinal hernia containing fat. 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 No lesions or fractures identified. IMPRESSION: 1. Numerous scattered small nonobstructing calculi in both kidneys. There is some perinephric stranding bilaterally but no evidence to suggest obvious pyelonephritis by delayed imaging. 2. Single dependent calculus in the bladder measuring approximately 8 mm in maximum diameter. 3. Atherosclerosis of the abdominal aorta and iliac arteries  without aneurysm. 4. Small left inguinal hernia containing fat. 5. 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 6. Trace pleural fluid at the left lung base. Aortic Atherosclerosis (ICD10-I70.0). Electronically Signed   By: Irish Lack M.D.   On: 07/21/2023 16:49    Scheduled Meds:    buPROPion  150 mg Oral Daily   citalopram  20 mg Oral Daily   enoxaparin (LOVENOX) injection  40 mg Subcutaneous Q24H   mirabegron ER  25 mg Oral Daily   pantoprazole  40 mg Oral Daily    Continuous Infusions:    ampicillin (OMNIPEN) IV       LOS: 4 days     Marcellus Scott, MD,  FACP, [facility], [facility], [facility]   [facility] [place]      To contact the attending provider between 7A-7P or the covering  provider during after hours 7P-7A, please log into the web site [email or web address] and access using universal [place] password for that web site. If you do not have the password, please call the hospital operator.  07/23/2023, 11:31 AM

## 2023-07-24 DIAGNOSIS — D649 Anemia, unspecified: Secondary | ICD-10-CM | POA: Diagnosis not present

## 2023-07-24 DIAGNOSIS — N1831 Chronic kidney disease, stage 3a: Secondary | ICD-10-CM | POA: Diagnosis not present

## 2023-07-24 DIAGNOSIS — K529 Noninfective gastroenteritis and colitis, unspecified: Secondary | ICD-10-CM

## 2023-07-24 DIAGNOSIS — G4733 Obstructive sleep apnea (adult) (pediatric): Secondary | ICD-10-CM | POA: Diagnosis not present

## 2023-07-24 DIAGNOSIS — R7881 Bacteremia: Secondary | ICD-10-CM | POA: Diagnosis not present

## 2023-07-24 DIAGNOSIS — C61 Malignant neoplasm of prostate: Secondary | ICD-10-CM

## 2023-07-24 LAB — CBC
HCT: 35 % — ABNORMAL LOW (ref 39.0–52.0)
Hemoglobin: 11.3 g/dL — ABNORMAL LOW (ref 13.0–17.0)
MCH: 32.1 pg (ref 26.0–34.0)
MCHC: 32.3 g/dL (ref 30.0–36.0)
MCV: 99.4 fL (ref 80.0–100.0)
Platelets: 178 10*3/uL (ref 150–400)
RBC: 3.52 MIL/uL — ABNORMAL LOW (ref 4.22–5.81)
RDW: 14.6 % (ref 11.5–15.5)
WBC: 8.4 10*3/uL (ref 4.0–10.5)
nRBC: 0 % (ref 0.0–0.2)

## 2023-07-24 LAB — BASIC METABOLIC PANEL
Anion gap: 9 (ref 5–15)
BUN: 17 mg/dL (ref 8–23)
CO2: 18 mmol/L — ABNORMAL LOW (ref 22–32)
Calcium: 8.9 mg/dL (ref 8.9–10.3)
Chloride: 111 mmol/L (ref 98–111)
Creatinine, Ser: 1.11 mg/dL (ref 0.61–1.24)
GFR, Estimated: >60 mL/min (ref >60)
Glucose, Bld: 98 mg/dL (ref 70–99)
Potassium: 3.6 mmol/L (ref 3.5–5.1)
Sodium: 138 mmol/L (ref 135–145)

## 2023-07-24 LAB — MAGNESIUM: Magnesium: 2.2 mg/dL (ref 1.7–2.4)

## 2023-07-24 MED ORDER — AMPICILLIN IV (FOR PTA / DISCHARGE USE ONLY): 12.0000 g | INTRAVENOUS | 0 refills | Status: AC

## 2023-07-24 MED ORDER — AMOXICILLIN 500 MG PO CAPS: 1000.0000 mg | ORAL_CAPSULE | Freq: Three times a day (TID) | ORAL | 0 refills | Status: AC

## 2023-07-24 MED ORDER — SUMATRIPTAN SUCCINATE 50 MG PO TABS
100.0000 mg | ORAL_TABLET | Freq: Once | ORAL | Status: AC
  Administered 2023-07-24: 100 mg via ORAL
  Filled 2023-07-24: qty 2

## 2023-07-24 NOTE — Assessment & Plan Note (Signed)
Continue with as needed imodium/ Advised to call in case of worsening diarrhea while on antibiotic therapy.

## 2023-07-24 NOTE — TOC Transition Note (Signed)
Transition of Care [facility]) - Discharge Note   Patient Details  Name: Luis Villegas MRN: [id number] Date of Birth: 11/23/1945  [facility]) CM/SW Contact:  Otelia Santee, LCSW Phone Number: 07/24/2023, 11:58 AM   Clinical Narrative:    Pt to return home with spouse. Pt will be completing IV abx tx at home. IV abx will be provided by [facility]. Pam w/ [facility] to meet with pt and spouse prior to discharge to provide education. HHPT/RN will be provided through [facility]    Final next level of care: Home w Home Health Services Barriers to Discharge: No Barriers Identified   Patient Goals and CMS Choice Patient states their goals for this hospitalization and ongoing recovery are:: To return home CMS Medicare.gov Compare Post Acute Care list provided to:: Patient Choice offered to / list presented to : Patient [place] ownership interest in [facility].provided to::  (NA)    Discharge Placement                       Discharge Plan and Services Additional resources added to the After Visit Summary for   In-house Referral: Clinical Social Work Discharge Planning Services: NA Post Acute Care Choice: Home Health, Durable Medical Equipment          DME Arranged: Walker rolling, IV pump/equipment DME Agency: [facility]) Date DME Agency Contacted: 07/23/23 Time DME Agency Contacted: [phone] Representative spoke with at DME Agency: RW- w/ Zack from [employer]. IV DME w/ Pam from [employer] HH Arranged: PT, RN HH Agency: [facility] Date [facility] Agency Contacted: 07/23/23 Time HH Agency Contacted: [phone] Representative spoke with at [facility] Agency: Marylene Land  Social Drivers of Health (SDOH) Interventions SDOH Screenings   Food Insecurity: No Food Insecurity (07/20/2023)  Housing: Low Risk  (07/20/2023)  Transportation Needs: No Transportation Needs (07/20/2023)  Utilities: Not At Risk (07/20/2023)  Alcohol Screen: Low Risk  (05/03/2021)   Depression (PHQ2-9): Low Risk  (06/01/2023)  Financial Resource Strain: Low Risk  (05/28/2023)  Physical Activity: Unknown (05/28/2023)  Social Connections: Unknown (05/28/2023)  Stress: Stress Concern Present (05/28/2023)  Tobacco Use: Medium Risk (07/22/2023)  Health Literacy: Adequate Health Literacy (06/01/2023)     Readmission Risk Interventions    07/24/2023   11:58 AM 07/23/2023    1:50 PM 07/21/2023    1:20 PM  Readmission Risk Prevention Plan  Post Dischage Appt Complete Complete Complete  Medication Screening Complete Complete Complete  Transportation Screening Complete  Complete

## 2023-07-24 NOTE — Assessment & Plan Note (Addendum)
Enterococcus fecalis bacteremia.  Patient initially was placed on broad spectrum antibiotic therapy with IV vancomycin, ceftriaxone, then transitioned to Unasyn and finally narrowed to ampicillin.  Follow up blood cultures from 12/17 with no growth.   12/20 mid line placed for antibiotic therapy   Plan to continue IV antibiotic therapy  with IV ampicillin through 12/31 and then continue with oral amoxicillin high dose through 08/18/2023.  Follow up with ID.   Sepsis present on admission, with mild metabolic encephalopathy, clinically resolved.

## 2023-07-24 NOTE — Discharge Summary (Signed)
Physician Discharge Summary   Patient: Luis Villegas MRN: [id number] DOB: [birth year]  Admit date:     07/19/2023  Discharge date: 07/24/23  Discharge Physician: York Ram Jarell Mcewen   PCP: Deeann Saint, MD   Recommendations at discharge:    Discharge antibiotics to be given via PICC line Ampicillin 12 gm every 24 hours as a continuous infusion   Duration: 2 weeks.  End Date: 08/04/23  [facility] Care Per Protocol:  Home health RN for IV administration and teaching; PICC line care and labs.   Labs weekly while on IV antibiotics: CBC with differential and BMP  Please pull PIC at completion of IV antibiotics Fax weekly labs to [phone]  Patient will transition to amoxicillin 1 gm TID on 08/05/23 for 2 weeks through 08/18/23.   Discharge Diagnoses: Principal Problem:   Bacteremia Active Problems:   Chronic kidney disease (CKD), stage III (moderate) (HCC)   Sleep apnea   Chronic anemia   Chronic diarrhea   Prostate cancer (HCC)  Resolved Problems:   * No resolved hospital problems. [facility] Course: Luis Villegas was admitted to the hospital with the working diagnosis of enterococcus fecalis bacteremia complicated with severe sepsis.   77 yo male with the past medical history of CKD, and chronic diarrhea who presented after a mechanical fall. He was found on the floor by his wife, and noted him not being himself. No evidence of syncope episode. On his initial physical evaluation he was febrile, 102.2 F, his blood pressure was 118/64, HR 90, and RR 20 with 02 saturation 95%, lungs with no wheezing or rales, heart with S1 and S2 present and regular with no gallops, rubs or murmurs, abdomen distended but not tender, no lower extremity edema.   Na 136, K 3.8 Cl 106 bicarbonate 24, glucose 100 bun 27 cr 1,49  Lactic acid 2,0 Wbc 13.3 hgb 12.2 plt 182  Blood cultures positive for enterococcus fecalis 2/2 bottles.  Sars covid 19 negative Respiratory viral panel  negative  Urine analysis SG 1,017, protein 30, negative leukocytes, negative hgb.   Chest radiograph with no infiltrates or effusions, no cardiomegaly.   EKG 93 bpm, normal axis, normal intervals, sinus rhythm with no significant ST segment or T wave changes.   Head CT negative for acute changes.  Abdomen and pelvis CT with numerous scattered small non obstructing calculi in both kidneys.  Some perinephric stranding bilaterally but not evidence to suggest obvious pyelonephritis by delayed imaging. Single dependent calculus in the bladder measuring approximately 8 mm in maximum diameter.  Small left inguinal hernia containing fat.   LP with ruled out meningitis.   TTE/TEE without vegetations and hence endocarditis ruled out.  Improving.   Surveillance blood cultures x 2 negative to date.   Midline placed 12/20 and DC home on IV ampicillin through 12/31 followed by oral amoxicillin high-dose through 08/18/2023  Assessment and Plan: * Bacteremia Enterococcus fecalis bacteremia.  Patient initially was placed on broad spectrum antibiotic therapy with IV vancomycin, ceftriaxone, then transitioned to Unasyn and finally narrowed to ampicillin.  Follow up blood cultures from 12/17 with no growth.   12/20 mid line placed for antibiotic therapy   Plan to continue IV antibiotic therapy  with IV ampicillin through 12/31 and then continue with oral amoxicillin high dose through 08/18/2023.  Follow up with ID.   Sepsis present on admission, with mild metabolic encephalopathy, clinically resolved.   Chronic kidney disease (CKD), stage III (moderate) (HCC) CKD satage 3a.  Hypokalemia;  At the time of discharge his renal function had a serum cr at 1,1 with K at 3,6 and serum bicarbonate at 18  Na 138 and Mg 2.2   Plan to continue follow up renal function and electrolytes as outpatient.   Nephrolithiasis, follow up as outpatient, no obstructive uropathy.   Chronic anemia Mild thrombocytopenia/    Clinically has been stable.  Plan to follow up cell count as outpatient.   Chronic diarrhea Continue with as needed imodium/ Advised to call in case of worsening diarrhea while on antibiotic therapy.   Prostate cancer [facility]) Sp prostatectomy and radiation therapy          Consultants: ID. Procedures performed: TEE   Disposition: Home Diet recommendation:  Regular diet DISCHARGE MEDICATION: Allergies as of 07/24/2023   No Active Allergies      Medication List     STOP taking these medications    acetaminophen-codeine 300-30 MG tablet Commonly known as: TYLENOL #3       TAKE these medications    acyclovir 200 MG capsule Commonly known as: ZOVIRAX TAKE 1 CAPSULE(200 MG) BY MOUTH DAILY What changed: See the new instructions.   amoxicillin 500 MG capsule Commonly known as: AMOXIL Take 2 capsules (1,000 mg total) by mouth 3 (three) times daily for 14 days. Start taking 08/05/23 after completion of IV antibiotics Start taking on: August 05, 2023 What changed:  how much to take additional instructions These instructions start on August 05, 2023. If you are unsure what to do until then, ask your doctor or other care provider.   ampicillin IVPB Inject 12 g into the vein daily for 12 days. As a continuous infusion. Indication:  E faecalis bacteremia First Dose: Yes Last Day of Therapy:  08/04/23 Labs - Once weekly:  CBC/D and BMP, Labs - Once weekly: ESR and CRP Method of administration: Ambulatory Pump (Continuous Infusion) Method of administration may be changed at the discretion of home infusion pharmacist based upon assessment of the patient and/or caregiver's ability to self-administer the medication ordered.   azelastine 0.05 % ophthalmic solution Commonly known as: OPTIVAR Apply 1 drop to eye 2 (two) times daily as needed.   buPROPion 150 MG 24 hr tablet Commonly known as: WELLBUTRIN XL TAKE 1 TABLET(150 MG) BY MOUTH DAILY   citalopram 20 MG  tablet Commonly known as: CELEXA TAKE 1 TABLET(20 MG) BY MOUTH DAILY   ezetimibe-simvastatin 10-40 MG tablet Commonly known as: VYTORIN Take 1 tablet by mouth at bedtime.   loperamide 2 MG tablet Commonly known as: Imodium A-D Use a 1/2 tablet every morning and titrate as needed.   MULTIVITAMIN ADULT PO Take by mouth.   Myrbetriq 25 MG Tb24 tablet Generic drug: mirabegron ER Take 25 mg by mouth daily.   omeprazole 20 MG capsule Commonly known as: PRILOSEC TAKE 1 CAPSULE BY MOUTH EVERY DAY   SUMAtriptan 100 MG tablet Commonly known as: IMITREX TAKE 1 TABLET BY MOUTH AS NEEDED FOR HEADACHE   tamsulosin 0.4 MG Caps capsule Commonly known as: FLOMAX Take 0.4 mg by mouth daily.   topiramate 200 MG tablet Commonly known as: TOPAMAX TAKE 1 TABLET(200 MG) BY MOUTH AT BEDTIME               Durable Medical Equipment  (From admission, onward)           Start     Ordered   07/23/23 1141  For home use only DME Walker rolling  Once  Comments: With 2 wheels as per PT recommendations.  Question Answer Comment  Walker: With 5 Inch Wheels   Patient needs a walker to treat with the following condition Physical deconditioning      07/23/23 1141              Discharge Care Instructions  (From admission, onward)           Start     Ordered   07/24/23 0000  Change dressing on IV access line weekly and PRN  (Home infusion instructions - [facility] )        07/24/23 1142            Follow-up Information     Care, [facility] Follow up.   Specialty: Home Health Services Why: Frances Furbish will follow up with you at discharge to provide home health physical therapy and nursing services. Contact information: [address] [place] [address] [phone]                Discharge Exam: Filed Weights   07/19/23 2323 07/20/23 0026  Weight: 73 kg 75.8 kg   BP (!) 144/73 (BP Location: Left Arm)   Pulse 64   Temp  98 F (36.7 C)   Resp 18   Ht 5\' 8"  (1.727 m)   Wt 75.8 kg   SpO2 95%   BMI 25.41 kg/m   Patient is feeling well, no chest pain or dyspnea, no abdominal pain, chronic diarrhea seems to be stable  Neurology awake and alert ENT with mild pallor Cardiovascular with S1 and S2 present and regular with no gallops, rubs or murmurs Respiratory with no rales or wheezing, no rhonchi Abdomen with no distention  No lower extremity edema   Condition at discharge: stable  The results of significant diagnostics from this hospitalization (including imaging, microbiology, ancillary and laboratory) are listed below for reference.   Imaging Studies: [place] EKG SITE RITE Result Date: 07/23/2023 If Site Rite image not attached, placement could not be confirmed due to current cardiac rhythm.  ECHO TEE Result Date: 07/22/2023    TRANSESOPHOGEAL ECHO REPORT   Patient Name:   Milfred Zeiner Date of Exam: 07/22/2023 Medical Rec #:  [id number]                Height:       68.0 in Accession #:    [id number]               Weight:       167.1 lb Date of Birth:  15-Nov-1945                BSA:          1.893 m Patient Age:    77 years                 BP:           149/77 mmHg Patient Gender: M                        HR:           82 bpm. Exam Location:  Inpatient Procedure: Transesophageal Echo, Color Doppler, Cardiac Doppler and 3D Echo Indications:     Bacteremia  History:         Patient has prior history of Echocardiogram examinations, most  recent 07/21/2023. Risk Factors:Dyslipidemia and Sleep Apnea.  Sonographer:     Irving Burton Senior RDCS Referring Phys:  Charlton Haws, C Diagnosing Phys: Charlton Haws MD PROCEDURE: After discussion of the risks and benefits of a TEE, an informed consent was obtained from the patient. The transesophogeal probe was passed without difficulty through the esophogus of the patient. Sedation performed by different physician. The patient was monitored while under deep  sedation. Anesthestetic sedation was provided intravenously by Anesthesiology: 100mg  of Propofol, 125mg  of Lidocaine. The patient's vital signs; including heart rate, blood pressure, and oxygen saturation; remained stable throughout the procedure. The patient developed no complications during the procedure.  IMPRESSIONS  1. Left ventricular ejection fraction, by estimation, is 60 to 65%. The left ventricle has normal function. The left ventricle has no regional wall motion abnormalities.  2. Right ventricular systolic function is normal. The right ventricular size is normal.  3. No left atrial/left atrial appendage thrombus was detected.  4. The mitral valve is normal in structure. Trivial mitral valve regurgitation. No evidence of mitral stenosis.  5. The aortic valve is normal in structure. There is moderate calcification of the aortic valve. There is moderate thickening of the aortic valve. Aortic valve regurgitation is not visualized. Aortic valve sclerosis is present, with no evidence of aortic valve stenosis.  6. The inferior vena cava is normal in size with greater than 50% respiratory variability, suggesting right atrial pressure of 3 mmHg.  7. Mobile septum with small PFO. Conclusion(s)/Recommendation(s): Normal biventricular function without evidence of hemodynamically significant valvular heart disease. FINDINGS  Left Ventricle: Left ventricular ejection fraction, by estimation, is 60 to 65%. The left ventricle has normal function. The left ventricle has no regional wall motion abnormalities. The left ventricular internal cavity size was normal in size. There is  no left ventricular hypertrophy. Right Ventricle: The right ventricular size is normal. No increase in right ventricular wall thickness. Right ventricular systolic function is normal. Left Atrium: Left atrial size was normal in size. No left atrial/left atrial appendage thrombus was detected. Right Atrium: Right atrial size was normal in size.  Pericardium: There is no evidence of pericardial effusion. Mitral Valve: The mitral valve is normal in structure. Trivial mitral valve regurgitation. No evidence of mitral valve stenosis. There is no evidence of mitral valve vegetation. Tricuspid Valve: The tricuspid valve is normal in structure. Tricuspid valve regurgitation is not demonstrated. No evidence of tricuspid stenosis. There is no evidence of tricuspid valve vegetation. Aortic Valve: The aortic valve is normal in structure. There is moderate calcification of the aortic valve. There is moderate thickening of the aortic valve. Aortic valve regurgitation is not visualized. Aortic valve sclerosis is present, with no evidence of aortic valve stenosis. There is no evidence of aortic valve vegetation. Pulmonic Valve: The pulmonic valve was normal in structure. Pulmonic valve regurgitation is not visualized. No evidence of pulmonic stenosis. There is no evidence of pulmonic valve vegetation. Aorta: The aortic root is normal in size and structure. Venous: The inferior vena cava is normal in size with greater than 50% respiratory variability, suggesting right atrial pressure of 3 mmHg. IAS/Shunts: Mobile septum with small PFO. Additional Comments: Spectral Doppler performed. AORTIC VALVE LVOT Vmax:   86.80 cm/s LVOT Vmean:  56.000 cm/s LVOT VTI:    0.161 m  SHUNTS Systemic VTI: 0.16 m Charlton Haws MD Electronically signed by Charlton Haws MD Signature Date/Time: 07/22/2023/10:09:50 AM    Final    EP STUDY Result Date: 07/22/2023 See surgical note  for result.  CT ABDOMEN PELVIS W CONTRAST Result Date: 07/21/2023 CLINICAL DATA:  Sepsis, fever, chronic diarrhea and chronic kidney disease. EXAM: CT ABDOMEN AND PELVIS WITH CONTRAST TECHNIQUE: Multidetector CT imaging of the abdomen and pelvis was performed using the standard protocol following bolus administration of intravenous contrast. RADIATION DOSE REDUCTION: This exam was performed according to the  departmental dose-optimization program which includes automated exposure control, adjustment of the mA and/or kV according to patient size and/or use of iterative reconstruction technique. CONTRAST:  OMNIPAQUE IOHEXOL 300 MG/ML  SOLN COMPARISON:  CT of the pelvis on 03/19/2007 FINDINGS: Lower chest: Trace pleural fluid at the left lung base. Hepatobiliary: No focal liver abnormality is seen. No gallstones, gallbladder wall thickening, or biliary dilatation. Pancreas: Unremarkable. No pancreatic ductal dilatation or surrounding inflammatory changes. Spleen: Normal in size without focal abnormality. Adrenals/Urinary Tract: No adrenal masses. Both kidneys contain numerous scattered small nonobstructing calculi. The biggest on the right within the upper pole measures approximately 6 mm. The biggest on the left in the mid kidney measures approximately 6 mm. There is some perinephric stranding bilaterally but no evidence to suggest obvious pyelonephritis by delayed imaging. The bladder contains a single dependent calculus in the midline dependent lumen measuring approximately 8 mm in maximum diameter. No evidence of bladder wall thickening or diverticula. Stomach/Bowel: Bowel shows no evidence of obstruction, ileus, inflammation or lesion. The appendix is normal. No free intraperitoneal air. Vascular/Lymphatic: Atherosclerosis of the abdominal aorta and iliac arteries without aneurysm. No lymphadenopathy identified. Reproductive: Stable appearance status post prior prostatectomy. Other: Small left inguinal hernia containing fat. 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 No lesions or fractures identified. IMPRESSION: 1. Numerous scattered small nonobstructing calculi in both kidneys. There is some perinephric stranding bilaterally but no evidence to suggest obvious pyelonephritis by delayed imaging. 2. Single dependent calculus in the bladder measuring approximately 8 mm in  maximum diameter. 3. Atherosclerosis of the abdominal aorta and iliac arteries without aneurysm. 4. Small left inguinal hernia containing fat. 5. 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 6. Trace pleural fluid at the left lung base. Aortic Atherosclerosis (ICD10-I70.0). Electronically Signed   By: Irish Lack M.D.   On: 07/21/2023 16:49   ECHOCARDIOGRAM COMPLETE Result Date: 07/21/2023    ECHOCARDIOGRAM REPORT   Patient Name:   MATTIA ROCCA Date of Exam: 07/21/2023 Medical Rec #:  [id number]                Height:       68.0 in Accession #:    [id number]               Weight:       167.1 lb Date of Birth:  12-26-1945                BSA:          1.893 m Patient Age:    77 years                 BP:           124/68 mmHg Patient Gender: M                        HR:           69 bpm. Exam Location:  Inpatient Procedure: 2D Echo, Cardiac Doppler and Color Doppler Indications:    Bacteremia  History:  Patient has no prior history of Echocardiogram examinations.                 Signs/Symptoms:Murmur.  Sonographer:    Darlys Gales Referring Phys: [phone] CORNELIUS N [place] IMPRESSIONS  1. Left ventricular ejection fraction, by estimation, is 55%. The left ventricle has normal function. The left ventricle has no regional wall motion abnormalities. Left ventricular diastolic parameters were normal.  2. Right ventricular systolic function is normal. The right ventricular size is normal. There is normal pulmonary artery systolic pressure. The estimated right ventricular systolic pressure is 29.4 mmHg.  3. The mitral valve is normal in structure. No evidence of mitral valve regurgitation. No evidence of mitral stenosis.  4. The aortic valve is tricuspid. There is mild calcification of the aortic valve. Aortic valve regurgitation is not visualized. No aortic stenosis is present.  5. The inferior vena cava is normal in size with greater than 50% respiratory variability, suggesting  right atrial pressure of 3 mmHg.  6. Technically difficult study. No valvular vegetation noted but if high suspicion would need TEE. FINDINGS  Left Ventricle: Left ventricular ejection fraction, by estimation, is 55%. The left ventricle has normal function. The left ventricle has no regional wall motion abnormalities. The left ventricular internal cavity size was normal in size. There is no left ventricular hypertrophy. Left ventricular diastolic parameters were normal. Right Ventricle: The right ventricular size is normal. No increase in right ventricular wall thickness. Right ventricular systolic function is normal. There is normal pulmonary artery systolic pressure. The tricuspid regurgitant velocity is 2.57 m/s, and  with an assumed right atrial pressure of 3 mmHg, the estimated right ventricular systolic pressure is 29.4 mmHg. Left Atrium: Left atrial size was normal in size. Right Atrium: Right atrial size was normal in size. Pericardium: There is no evidence of pericardial effusion. Mitral Valve: The mitral valve is normal in structure. No evidence of mitral valve regurgitation. No evidence of mitral valve stenosis. Tricuspid Valve: The tricuspid valve is normal in structure. Tricuspid valve regurgitation is trivial. Aortic Valve: The aortic valve is tricuspid. There is mild calcification of the aortic valve. Aortic valve regurgitation is not visualized. No aortic stenosis is present. Aortic valve mean gradient measures 5.0 mmHg. Aortic valve peak gradient measures 8.9 mmHg. Aortic valve area, by VTI measures 2.53 cm. Pulmonic Valve: The pulmonic valve was normal in structure. Pulmonic valve regurgitation is trivial. Aorta: The aortic root is normal in size and structure. Venous: The inferior vena cava is normal in size with greater than 50% respiratory variability, suggesting right atrial pressure of 3 mmHg. IAS/Shunts: No atrial level shunt detected by color flow Doppler.  LEFT VENTRICLE PLAX 2D LVIDd:          4.20 cm   Diastology LVIDs:         3.10 cm   LV e' medial:    9.57 cm/s LV PW:         0.90 cm   LV E/e' medial:  10.8 LV IVS:        0.90 cm   LV e' lateral:   9.57 cm/s LVOT diam:     1.90 cm   LV E/e' lateral: 10.8 LV SV:         76 LV SV Index:   40 LVOT Area:     2.84 cm  RIGHT VENTRICLE RV S prime:     10.30 cm/s TAPSE (M-mode): 2.8 cm LEFT ATRIUM  Index        RIGHT ATRIUM           Index LA Vol (A2C):   68.7 ml 36.28 ml/m  RA Area:     16.30 cm LA Vol (A4C):   39.7 ml 20.97 ml/m  RA Volume:   49.60 ml  26.20 ml/m LA Biplane Vol: 55.3 ml 29.21 ml/m  AORTIC VALVE AV Area (Vmax):    2.32 cm AV Area (Vmean):   2.55 cm AV Area (VTI):     2.53 cm AV Vmax:           149.00 cm/s AV Vmean:          101.000 cm/s AV VTI:            0.302 m AV Peak Grad:      8.9 mmHg AV Mean Grad:      5.0 mmHg LVOT Vmax:         122.00 cm/s LVOT Vmean:        91.000 cm/s LVOT VTI:          0.269 m LVOT/AV VTI ratio: 0.89  AORTA Ao Root diam: 2.50 cm MITRAL VALVE                TRICUSPID VALVE MV Area (PHT): 3.68 cm     TR Peak grad:   26.4 mmHg MV Decel Time: 206 msec     TR Vmax:        257.00 cm/s MV E velocity: 103.00 cm/s MV A velocity: 87.80 cm/s   SHUNTS MV E/A ratio:  1.17         Systemic VTI:  0.27 m                             Systemic Diam: 1.90 cm Dalton McleanMD Electronically signed by Wilfred Lacy Signature Date/Time: 07/21/2023/9:15:34 AM    Final    CT Head Wo Contrast Result Date: 07/19/2023 CLINICAL DATA:  Head trauma, minor (Age >= 65y) EXAM: CT HEAD WITHOUT CONTRAST TECHNIQUE: Contiguous axial images were obtained from the base of the skull through the vertex without intravenous contrast. RADIATION DOSE REDUCTION: This exam was performed according to the departmental dose-optimization program which includes automated exposure control, adjustment of the mA and/or kV according to patient size and/or use of iterative reconstruction technique. COMPARISON:  09/05/2022 FINDINGS: Brain:  Normal anatomic configuration. Parenchymal volume loss is commensurate with the patient's age. Stable mild periventricular white matter changes are present likely reflecting the sequela of small vessel ischemia. No abnormal intra or extra-axial mass lesion or fluid collection. No abnormal mass effect or midline shift. No evidence of acute intracranial hemorrhage or infarct. Ventricular size is normal. Cerebellum unremarkable. Vascular: No asymmetric hyperdense vasculature at the skull base. Skull: Intact Sinuses/Orbits: Paranasal sinuses are clear. Orbits are unremarkable. Other: Mastoid air cells and middle ear cavities are clear. IMPRESSION: 1. No acute intracranial hemorrhage or infarct. No calvarial fracture. 2. Stable mild senescent change. Electronically Signed   By: Helyn Numbers M.D.   On: 07/19/2023 19:55   DG Chest Port 1 View Result Date: 07/19/2023 CLINICAL DATA:  Fall. On floor for 2 hours until EMS called. Pupils are pinpoint and nonreactive 2 light. Does not remember fall. EXAM: PORTABLE CHEST 1 VIEW COMPARISON:  11/27/2008 FINDINGS: Stable cardiomediastinal silhouette. Aortic atherosclerotic calcification. No focal consolidation, pleural effusion, or pneumothorax. No displaced rib fractures. Cervical spine fusion IMPRESSION: No acute cardiopulmonary  disease. Electronically Signed   By: Minerva Fester M.D.   On: 07/19/2023 19:34    Microbiology: Results for orders placed or performed during the hospital encounter of 07/19/23  Blood Culture (routine x 2)     Status: Abnormal   Collection Time: 07/19/23  7:21 PM   Specimen: Right Antecubital; Blood  Result Value Ref Range Status   Specimen Description   Final    RIGHT ANTECUBITAL BOTTLES DRAWN AEROBIC AND ANAEROBIC Performed at [facility], [address], [place], [place] [address]    Special Requests   Final    Blood Culture adequate volume Performed at [facility], [address]  [place], [place] [address]    Culture  Setup Time   Final    GRAM POSITIVE COCCI IN BOTH AEROBIC AND ANAEROBIC BOTTLES CRITICAL RESULT CALLED TO, READ BACK BY AND VERIFIED WITH: PHARMD D.WOFFORD AT 07/20/2023 BY T.SAAD. Performed at [facility], [address] [place], [place] [address]    Culture ENTEROCOCCUS FAECALIS (A)  Final   Report Status 07/22/2023 FINAL  Final   Organism ID, Bacteria ENTEROCOCCUS FAECALIS  Final      Susceptibility   Enterococcus faecalis - MIC*    AMPICILLIN <=2 SENSITIVE Sensitive     VANCOMYCIN 2 SENSITIVE Sensitive     GENTAMICIN SYNERGY SENSITIVE Sensitive     * ENTEROCOCCUS FAECALIS  Blood Culture ID Panel (Reflexed)     Status: Abnormal   Collection Time: 07/19/23  7:21 PM  Result Value Ref Range Status   Enterococcus faecalis DETECTED (A) NOT DETECTED Final    Comment: CRITICAL RESULT CALLED TO, READ BACK BY AND VERIFIED WITH: PHARMD D.WOFFORD AT 07/20/2023 BY T.SAAD.    Enterococcus Faecium NOT DETECTED NOT DETECTED Final   Listeria monocytogenes NOT DETECTED NOT DETECTED Final   Staphylococcus species NOT DETECTED NOT DETECTED Final   Staphylococcus aureus (BCID) NOT DETECTED NOT DETECTED Final   Staphylococcus epidermidis NOT DETECTED NOT DETECTED Final   Staphylococcus lugdunensis NOT DETECTED NOT DETECTED Final   Streptococcus species NOT DETECTED NOT DETECTED Final   Streptococcus agalactiae NOT DETECTED NOT DETECTED Final   Streptococcus pneumoniae NOT DETECTED NOT DETECTED Final   Streptococcus pyogenes NOT DETECTED NOT DETECTED Final   A.calcoaceticus-baumannii NOT DETECTED NOT DETECTED Final   Bacteroides fragilis NOT DETECTED NOT DETECTED Final   Enterobacterales NOT DETECTED NOT DETECTED Final   Enterobacter cloacae complex NOT DETECTED NOT DETECTED Final   Escherichia coli NOT DETECTED NOT DETECTED Final   Klebsiella aerogenes NOT DETECTED NOT DETECTED Final   Klebsiella oxytoca NOT DETECTED NOT DETECTED Final   Klebsiella  pneumoniae NOT DETECTED NOT DETECTED Final   Proteus species NOT DETECTED NOT DETECTED Final   Salmonella species NOT DETECTED NOT DETECTED Final   Serratia marcescens NOT DETECTED NOT DETECTED Final   Haemophilus influenzae NOT DETECTED NOT DETECTED Final   Neisseria meningitidis NOT DETECTED NOT DETECTED Final   Pseudomonas aeruginosa NOT DETECTED NOT DETECTED Final   Stenotrophomonas maltophilia NOT DETECTED NOT DETECTED Final   Candida albicans NOT DETECTED NOT DETECTED Final   Candida auris NOT DETECTED NOT DETECTED Final   Candida glabrata NOT DETECTED NOT DETECTED Final   Candida krusei NOT DETECTED NOT DETECTED Final   Candida parapsilosis NOT DETECTED NOT DETECTED Final   Candida tropicalis NOT DETECTED NOT DETECTED Final   Cryptococcus neoformans/gattii NOT DETECTED NOT DETECTED Final   Vancomycin resistance NOT DETECTED NOT DETECTED Final    Comment: Performed  at [facility], [address] [place], [place] [address]  Resp panel by RT-PCR (RSV, Flu A&B, Covid) Anterior Nasal Swab     Status: None   Collection Time: 07/19/23  7:43 PM   Specimen: Anterior Nasal Swab  Result Value Ref Range Status   SARS Coronavirus 2 by RT PCR NEGATIVE NEGATIVE Final    Comment: (NOTE) SARS-CoV-2 target nucleic acids are NOT DETECTED.  The SARS-CoV-2 RNA is generally detectable in upper respiratory specimens during the acute phase of infection. The lowest concentration of SARS-CoV-2 viral copies this assay can detect is 138 copies/mL. A negative result does not preclude SARS-Cov-2 infection and should not be used as the sole basis for treatment or other patient management decisions. A negative result may occur with  improper specimen collection/handling, submission of specimen other than nasopharyngeal swab, presence of viral mutation(s) within the areas targeted by this assay, and inadequate number of viral copies(<138 copies/mL). A negative result must be combined with clinical  observations, patient history, and epidemiological information. The expected result is Negative.  Fact Sheet for Patients:  BloggerCourse.com  Fact Sheet for Healthcare Providers:  SeriousBroker.it  This test is no t yet approved or cleared by the [place] FDA and  has been authorized for detection and/or diagnosis of SARS-CoV-2 by FDA under an Emergency Use Authorization (EUA). This EUA will remain  in effect (meaning this test can be used) for the duration of the COVID-19 declaration under Section 564(b)(1) of the Act, 21 U.S.C.section 360bbb-3(b)(1), unless the authorization is terminated  or revoked sooner.       Influenza A by PCR NEGATIVE NEGATIVE Final   Influenza B by PCR NEGATIVE NEGATIVE Final    Comment: (NOTE) The Xpert Xpress SARS-CoV-2/FLU/RSV plus assay is intended as an aid in the diagnosis of influenza from Nasopharyngeal swab specimens and should not be used as a sole basis for treatment. Nasal washings and aspirates are unacceptable for Xpert Xpress SARS-CoV-2/FLU/RSV testing.  Fact Sheet for Patients: BloggerCourse.com  Fact Sheet for Healthcare Providers: SeriousBroker.it  This test is not yet approved or cleared by the [place] FDA and has been authorized for detection and/or diagnosis of SARS-CoV-2 by FDA under an Emergency Use Authorization (EUA). This EUA will remain in effect (meaning this test can be used) for the duration of the COVID-19 declaration under Section 564(b)(1) of the Act, 21 U.S.C. section 360bbb-3(b)(1), unless the authorization is terminated or revoked.     Resp Syncytial Virus by PCR NEGATIVE NEGATIVE Final    Comment: (NOTE) Fact Sheet for Patients: BloggerCourse.com  Fact Sheet for Healthcare Providers: SeriousBroker.it  This test is not yet approved or cleared by  the [place] FDA and has been authorized for detection and/or diagnosis of SARS-CoV-2 by FDA under an Emergency Use Authorization (EUA). This EUA will remain in effect (meaning this test can be used) for the duration of the COVID-19 declaration under Section 564(b)(1) of the Act, 21 U.S.C. section 360bbb-3(b)(1), unless the authorization is terminated or revoked.  Performed at [facility], [address], [place], [place] [address]   Blood Culture (routine x 2)     Status: Abnormal   Collection Time: 07/19/23  8:00 PM   Specimen: BLOOD RIGHT ARM  Result Value Ref Range Status   Specimen Description   Final    BLOOD RIGHT ARM BOTTLES DRAWN AEROBIC AND ANAEROBIC Performed at [facility], [address] [place], [place] [address]    Special  Requests   Final    Blood Culture results may not be optimal due to an inadequate volume of blood received in culture bottles Performed at [facility], [address], [place], [place] [address]    Culture  Setup Time   Final    GRAM POSITIVE COCCI AEROBIC BOTTLE ONLY CRITICAL VALUE NOTED.  VALUE IS CONSISTENT WITH PREVIOUSLY REPORTED AND CALLED VALUE.    Culture (A)  Final    ENTEROCOCCUS FAECALIS SUSCEPTIBILITIES PERFORMED ON PREVIOUS CULTURE WITHIN THE LAST 5 DAYS. Performed at [facility], [address], [place], [place] [address]    Report Status 07/22/2023 FINAL  Final  CSF culture w Gram Stain     Status: None   Collection Time: 07/19/23 10:39 PM   Specimen: CSF; Cerebrospinal Fluid  Result Value Ref Range Status   Specimen Description   Final    CSF Performed at [facility], [address], [place], [place] [address]    Special Requests   Final    CSF Performed at [facility], [address], [place], [place] [address]    Gram Stain NO WBC SEEN NO ORGANISMS SEEN CYTOSPIN SMEAR   Final   Culture   Final    NO  GROWTH 3 DAYS Performed at [facility], [address] [place], [place] [address]    Report Status 07/23/2023 FINAL  Final  Culture, blood (Routine X 2) w Reflex to ID Panel     Status: None (Preliminary result)   Collection Time: 07/21/23  5:37 AM   Specimen: BLOOD LEFT ARM  Result Value Ref Range Status   Specimen Description   Final    BLOOD LEFT ARM Performed at [facility], [address] [place], [place] [address]    Special Requests   Final    BOTTLES DRAWN AEROBIC AND ANAEROBIC Blood Culture results may not be optimal due to an inadequate volume of blood received in culture bottles Performed at [facility], [address] [place], [place] [address]    Culture   Final    NO GROWTH 3 DAYS Performed at [facility], [address], [place], [place] [address]    Report Status PENDING  Incomplete  Culture, blood (Routine X 2) w Reflex to ID Panel     Status: None (Preliminary result)   Collection Time: 07/21/23  5:48 AM   Specimen: BLOOD LEFT ARM  Result Value Ref Range Status   Specimen Description   Final    BLOOD LEFT ARM Performed at [facility], [address] [place], [place] [address]    Special Requests   Final    BOTTLES DRAWN AEROBIC AND ANAEROBIC Blood Culture results may not be optimal due to an inadequate volume of blood received in culture bottles Performed at [facility], [address] [place], [place] [address]    Culture   Final    NO GROWTH 3 DAYS Performed at [facility], [address], [place], [place] [address]    Report Status PENDING  Incomplete    Labs: CBC: Recent Labs  Lab 07/19/23 1916 07/20/23 0527 07/21/23 0548 07/22/23 0538 07/23/23 0553 07/24/23 0355  WBC 13.3* 16.2* 15.6* 10.6* 7.5 8.4  NEUTROABS 11.1*  --   --   --   --   --   HGB 12.2* 11.1* 11.2* 10.8* 10.3* 11.3*  HCT 38.2* 35.0* 32.9*  32.9* 31.2* 35.0*  MCV 101.1* 102.0* 99.1 99.4 99.0 99.4   PLT 182 155 133* 124* 135* 178   Basic Metabolic Panel: Recent Labs  Lab 07/19/23 1916 07/20/23 0527 07/22/23 0538 07/23/23 0553 07/24/23 0355  NA 136 135 137 138 138  K 3.8 3.5 2.9* 3.2* 3.6  CL 106 107 109 109 111  CO2 24 20* 21* 19* 18*  GLUCOSE 100* 103* 109* 100* 98  BUN 27* 23 21 18 17   CREATININE 1.49* 1.34* 1.23 1.11 1.11  CALCIUM 9.2 8.8* 8.3* 8.4* 8.9  MG  --   --  2.1  --  2.2   Liver Function Tests: No results for input(s): "AST", "ALT", "ALKPHOS", "BILITOT", "PROT", "ALBUMIN" in the last 168 hours. CBG: Recent Labs  Lab 07/20/23 2317  GLUCAP 124*    Discharge time spent: greater than 30 minutes.  Signed: Coralie Keens, MD [facility] 07/24/2023

## 2023-07-24 NOTE — Assessment & Plan Note (Signed)
Mild thrombocytopenia/   Clinically has been stable.  Plan to follow up cell count as outpatient.

## 2023-07-24 NOTE — Hospital Course (Signed)
Mr. Luis Villegas was admitted to the hospital with the working diagnosis of enterococcus fecalis bacteremia complicated with severe sepsis.   77 yo male with the past medical history of CKD, and chronic diarrhea who presented after a mechanical fall. He was found on the floor by his wife, and noted him not being himself. No evidence of syncope episode. On his initial physical evaluation he was febrile, 102.2 F, his blood pressure was 118/64, HR 90, and RR 20 with 02 saturation 95%, lungs with no wheezing or rales, heart with S1 and S2 present and regular with no gallops, rubs or murmurs, abdomen distended but not tender, no lower extremity edema.   Na 136, K 3.8 Cl 106 bicarbonate 24, glucose 100 bun 27 cr 1,49  Lactic acid 2,0 Wbc 13.3 hgb 12.2 plt 182  Blood cultures positive for enterococcus fecalis 2/2 bottles.  Sars covid 19 negative Respiratory viral panel negative  Urine analysis SG 1,017, protein 30, negative leukocytes, negative hgb.   Chest radiograph with no infiltrates or effusions, no cardiomegaly.   EKG 93 bpm, normal axis, normal intervals, sinus rhythm with no significant ST segment or T wave changes.   Head CT negative for acute changes.  Abdomen and pelvis CT with numerous scattered small non obstructing calculi in both kidneys.  Some perinephric stranding bilaterally but not evidence to suggest obvious pyelonephritis by delayed imaging. Single dependent calculus in the bladder measuring approximately 8 mm in maximum diameter.  Small left inguinal hernia containing fat.   LP with ruled out meningitis.   TTE/TEE without vegetations and hence endocarditis ruled out.  Improving.   Surveillance blood cultures x 2 negative to date.   Midline placed 12/20 and DC home on IV ampicillin through 12/31 followed by oral amoxicillin high-dose through 08/18/2023

## 2023-07-24 NOTE — Assessment & Plan Note (Addendum)
Sp prostatectomy and radiation therapy

## 2023-07-24 NOTE — Plan of Care (Signed)
  Problem: Education: Goal: Knowledge of General Education information will improve Description: Including pain rating scale, medication(s)/side effects and non-pharmacologic comfort measures Outcome: Progressing   Problem: Clinical Measurements: Goal: Will remain free from infection Outcome: Progressing Goal: Diagnostic test results will improve Outcome: Progressing   

## 2023-07-24 NOTE — Assessment & Plan Note (Addendum)
CKD satage 3a. Hypokalemia;  At the time of discharge his renal function had a serum cr at 1,1 with K at 3,6 and serum bicarbonate at 18  Na 138 and Mg 2.2   Plan to continue follow up renal function and electrolytes as outpatient.   Nephrolithiasis, follow up as outpatient, no obstructive uropathy.

## 2023-07-25 DIAGNOSIS — D631 Anemia in chronic kidney disease: Secondary | ICD-10-CM | POA: Diagnosis not present

## 2023-07-25 DIAGNOSIS — I700 Atherosclerosis of aorta: Secondary | ICD-10-CM | POA: Diagnosis not present

## 2023-07-25 DIAGNOSIS — A4181 Sepsis due to Enterococcus: Secondary | ICD-10-CM | POA: Diagnosis not present

## 2023-07-25 DIAGNOSIS — Z452 Encounter for adjustment and management of vascular access device: Secondary | ICD-10-CM | POA: Diagnosis not present

## 2023-07-25 DIAGNOSIS — Z8546 Personal history of malignant neoplasm of prostate: Secondary | ICD-10-CM | POA: Diagnosis not present

## 2023-07-25 DIAGNOSIS — Z981 Arthrodesis status: Secondary | ICD-10-CM | POA: Diagnosis not present

## 2023-07-25 DIAGNOSIS — I080 Rheumatic disorders of both mitral and aortic valves: Secondary | ICD-10-CM | POA: Diagnosis not present

## 2023-07-25 DIAGNOSIS — Z87891 Personal history of nicotine dependence: Secondary | ICD-10-CM | POA: Diagnosis not present

## 2023-07-25 DIAGNOSIS — F32A Depression, unspecified: Secondary | ICD-10-CM | POA: Diagnosis not present

## 2023-07-25 DIAGNOSIS — G4730 Sleep apnea, unspecified: Secondary | ICD-10-CM | POA: Diagnosis not present

## 2023-07-25 DIAGNOSIS — Z9181 History of falling: Secondary | ICD-10-CM | POA: Diagnosis not present

## 2023-07-25 DIAGNOSIS — K219 Gastro-esophageal reflux disease without esophagitis: Secondary | ICD-10-CM | POA: Diagnosis not present

## 2023-07-25 DIAGNOSIS — E785 Hyperlipidemia, unspecified: Secondary | ICD-10-CM | POA: Diagnosis not present

## 2023-07-25 DIAGNOSIS — N200 Calculus of kidney: Secondary | ICD-10-CM | POA: Diagnosis not present

## 2023-07-25 DIAGNOSIS — K4090 Unilateral inguinal hernia, without obstruction or gangrene, not specified as recurrent: Secondary | ICD-10-CM | POA: Diagnosis not present

## 2023-07-25 DIAGNOSIS — M51369 Other intervertebral disc degeneration, lumbar region without mention of lumbar back pain or lower extremity pain: Secondary | ICD-10-CM | POA: Diagnosis not present

## 2023-07-25 DIAGNOSIS — K589 Irritable bowel syndrome without diarrhea: Secondary | ICD-10-CM | POA: Diagnosis not present

## 2023-07-25 DIAGNOSIS — N1831 Chronic kidney disease, stage 3a: Secondary | ICD-10-CM | POA: Diagnosis not present

## 2023-07-25 DIAGNOSIS — Z792 Long term (current) use of antibiotics: Secondary | ICD-10-CM | POA: Diagnosis not present

## 2023-07-25 DIAGNOSIS — D696 Thrombocytopenia, unspecified: Secondary | ICD-10-CM | POA: Diagnosis not present

## 2023-07-25 DIAGNOSIS — I10 Essential (primary) hypertension: Secondary | ICD-10-CM | POA: Diagnosis not present

## 2023-07-26 LAB — CULTURE, BLOOD (ROUTINE X 2)
Culture: NO GROWTH
Culture: NO GROWTH

## 2023-07-27 ENCOUNTER — Telehealth: Payer: Self-pay | Admitting: *Deleted

## 2023-07-27 NOTE — Transitions of Care (Post Inpatient/ED Visit) (Signed)
   07/27/2023  Name: Luis Villegas MRN: [id number] DOB: [birth year]  Today's TOC FU Call Status: Today's TOC FU Call Status:: Unsuccessful Call (1st Attempt) Unsuccessful Call (1st Attempt) Date: 07/27/23  Attempted to reach the patient regarding the most recent Inpatient/ED visit.  Follow Up Plan: Additional outreach attempts will be made to reach the patient to complete the Transitions of Care (Post Inpatient/ED visit) call.   Gean Maidens BSN RN Population Health- Transition of Care Team.  [facility] [phone]

## 2023-07-28 ENCOUNTER — Ambulatory Visit: Payer: Medicare Other | Admitting: Family Medicine

## 2023-07-28 ENCOUNTER — Encounter: Payer: Self-pay | Admitting: Family Medicine

## 2023-07-28 ENCOUNTER — Telehealth: Payer: Self-pay

## 2023-07-28 VITALS — BP 110/60 | HR 76 | Resp 16 | Ht 68.0 in | Wt 167.0 lb

## 2023-07-28 DIAGNOSIS — A498 Other bacterial infections of unspecified site: Secondary | ICD-10-CM | POA: Diagnosis not present

## 2023-07-28 DIAGNOSIS — N1831 Chronic kidney disease, stage 3a: Secondary | ICD-10-CM

## 2023-07-28 DIAGNOSIS — D649 Anemia, unspecified: Secondary | ICD-10-CM

## 2023-07-28 DIAGNOSIS — K529 Noninfective gastroenteritis and colitis, unspecified: Secondary | ICD-10-CM | POA: Diagnosis not present

## 2023-07-28 DIAGNOSIS — I700 Atherosclerosis of aorta: Secondary | ICD-10-CM | POA: Diagnosis not present

## 2023-07-28 DIAGNOSIS — K4090 Unilateral inguinal hernia, without obstruction or gangrene, not specified as recurrent: Secondary | ICD-10-CM | POA: Diagnosis not present

## 2023-07-28 DIAGNOSIS — Z452 Encounter for adjustment and management of vascular access device: Secondary | ICD-10-CM | POA: Diagnosis not present

## 2023-07-28 DIAGNOSIS — A4181 Sepsis due to Enterococcus: Secondary | ICD-10-CM | POA: Diagnosis not present

## 2023-07-28 DIAGNOSIS — D631 Anemia in chronic kidney disease: Secondary | ICD-10-CM | POA: Diagnosis not present

## 2023-07-28 DIAGNOSIS — N200 Calculus of kidney: Secondary | ICD-10-CM | POA: Diagnosis not present

## 2023-07-28 DIAGNOSIS — I080 Rheumatic disorders of both mitral and aortic valves: Secondary | ICD-10-CM | POA: Diagnosis not present

## 2023-07-28 DIAGNOSIS — M51369 Other intervertebral disc degeneration, lumbar region without mention of lumbar back pain or lower extremity pain: Secondary | ICD-10-CM | POA: Diagnosis not present

## 2023-07-28 NOTE — Patient Instructions (Addendum)
A few things to remember from today's visit:  Enterococcus faecalis infection - Plan: CANCELED: CBC with Differential/Platelet  Stage 3a chronic kidney disease (HCC) - Plan: CANCELED: Basic metabolic panel  Nephrolithiasis  Unilateral inguinal hernia without obstruction or gangrene, recurrence not specified  Complete antibiotic treatment. Continue daily probiotic and adequate hydration. Because you are having blood work done at home, we did not do it today. You need CBC and kidney test weekly. Fall precautions. You have an appt with infectious disease doctor in February.  If you need refills for medications you take chronically, please call your pharmacy. Do not use My Chart to request refills or for acute issues that need immediate attention. If you send a my chart message, it may take a few days to be addressed, specially if I am not in the office.  Please be sure medication list is accurate. If a new problem present, please set up appointment sooner than planned today.

## 2023-07-28 NOTE — Transitions of Care (Post Inpatient/ED Visit) (Signed)
   07/28/2023  Name: Luis Villegas MRN: [id number] DOB: 01-12-1946  Today's TOC FU Call Status: Today's TOC FU Call Status:: Unsuccessful Call (3rd Attempt) Unsuccessful Call (3rd Attempt) Date: 07/28/23  Attempted to reach the patient regarding the most recent Inpatient/ED visit.  Follow Up Plan: No further outreach attempts will be made at this time. We have been unable to contact the patient.    Antionette Fairy, RN,BSN,CCM RN Care Manager Transitions of Care  -[facility]/Population Health  Direct Phone: [phone] Toll Free: [phone] Fax: [phone]

## 2023-07-28 NOTE — Progress Notes (Signed)
ACUTE VISIT Chief Complaint  Patient presents with   Hospitalization Follow-up   HPI: Luis Villegas is a 77 y.o. male with a PMHx significant for migraines, sleep apnea, diarrhea, GERD, CKD III, prostate cancer, bacteremia, and HLD, among some, who is here today for hospital follow up.  TOC call 07/27/23.  Patient was hospitalized on 12/15 after his wife found him on the floor after a fall. Luis Villegas presented to ED with a temperature of 102.2, and sepsis with mild metabolic encephalopathy. A blood culture showed enterococcus fecalis bacteremia. Luis Villegas was placed on IV vancomycin and ceftriaxone, transition to Unasyn in the hospital. Follow up cultures on 12/17 showed no growth. PICC line placement on 07/24/2023.  Luis Villegas was discharged on  IV ampicillin through 12/31 and then transition to oral amoxicillin through 08/18/2023. Luis Villegas is taking an OTC probiotic.  Echo on 07/21/2023: LVEF 55% with normal diastolic function parameters. Chest x-ray on admission negative for acute cardiopulmonary disease. LP on 07/19/23 to rule out meningitis.  TTE/TEE without vegetations and hence endocarditis ruled out.  Improving.   Blood cultures x 2 negative no growth after 5 days, final on 07/26/2023.  Luis Villegas says Luis Villegas is feeling better in general now. His appetite is improving every day.  Luis Villegas reports some worsening diarrhea  while on IV antibiotics. His stools have been  more watery and softer than usual. Luis Villegas has been taking imodium for a long time for chronic diarrhea, and with it, Luis Villegas is having 3 bowel movements per day. Denies any blood in his stool or melena.  Chronic anemia: Luis Villegas is not on iron supplementation. H/H back to baseline.    Latest Ref Rng & Units 07/24/2023    3:55 AM 07/23/2023    5:53 AM 07/22/2023    5:38 AM  CBC  WBC 4.0 - 10.5 K/uL 8.4  7.5  10.6   Hemoglobin 13.0 - 17.0 g/dL 16.1  09.6  04.5   Hematocrit 39.0 - 52.0 % 35.0  31.2  32.9   Platelets 150 - 400 K/uL 178  135  124    Luis Villegas  reports occasional difficulty starting urination. Currently on Myrbetriq 25 mg daily and Flomax 0.4 mg daily.  Nephrolithiasis, abdominal CT showed an 8 mm kidney stone in his bladder. Luis Villegas has an appointment with urology in 08/2023. Pelvis CT from 07/21/23: Both kidneys contain numerous scattered small nonobstructing calculi. The biggest on the right within the upper pole measures approximately 6 mm. The biggest on the left in the mid kidney measures approximately 6 mm. There is some perinephric stranding bilaterally but no evidence to suggest obvious pyelonephritis by delayed imaging. The bladder contains a single dependent calculus in the midline dependent lumen measuring approximately 8 mm in maximum diameter. No evidence of bladder wall thickening or diverticula.  Luis Villegas denies any fever, chills, chest pain, SOB, palpitations, abdominal pain, nausea, or vomiting.   CKD III: Renal function has improved. HypoK+ corrected before discharge. Negative for gross hematuria or foamy urine.  Lab Results  Component Value Date   NA 138 07/24/2023   CL 111 07/24/2023   K 3.6 07/24/2023   CO2 18 (L) 07/24/2023   BUN 17 07/24/2023   CREATININE 1.11 07/24/2023   GFRNONAA >60 07/24/2023   CALCIUM 8.9 07/24/2023   ALBUMIN 3.8 09/04/2022   GLUCOSE 98 07/24/2023   Luis Villegas is having a home health nurse come today to draw blood. Luis Villegas will have physical therapy coming on 12/26.  Luis Villegas has a walker, but says Luis Villegas  does not need it. Luis Villegas could not walk as of 12/19, but is feeling much stronger now.  Luis Villegas is able to use the bathroom, shower, and eat without assistance.  Luis Villegas is not driving yet.  Other chronic medications unchanged.  Review of Systems  Constitutional:  Positive for fatigue. Negative for activity change, appetite change and fever.  HENT:  Negative for nosebleeds, sore throat and trouble swallowing.   Eyes:  Negative for redness and visual disturbance.  Respiratory:  Negative for cough and wheezing.    Cardiovascular:  Negative for leg swelling.  Genitourinary:  Negative for decreased urine volume, dysuria and hematuria.  Skin:  Negative for rash.  Neurological:  Negative for syncope, weakness and headaches.  Psychiatric/Behavioral:  Negative for confusion and hallucinations.   See other pertinent positives and negatives in HPI.  Current Outpatient Medications on File Prior to Visit  Medication Sig Dispense Refill   acyclovir (ZOVIRAX) 200 MG capsule TAKE 1 CAPSULE(200 MG) BY MOUTH DAILY (Patient taking differently: Take 200 mg by mouth daily as needed.) 90 capsule 1   [START ON 08/05/2023] amoxicillin (AMOXIL) 500 MG capsule Take 2 capsules (1,000 mg total) by mouth 3 (three) times daily for 14 days. Start taking 08/05/23 after completion of IV antibiotics 84 capsule 0   ampicillin IVPB Inject 12 g into the vein daily for 12 days. As a continuous infusion. Indication:  E faecalis bacteremia First Dose: Yes Last Day of Therapy:  08/04/23 Labs - Once weekly:  CBC/D and BMP, Labs - Once weekly: ESR and CRP Method of administration: Ambulatory Pump (Continuous Infusion) Method of administration may be changed at the discretion of home infusion pharmacist based upon assessment of the patient and/or caregiver's ability to self-administer the medication ordered. 12 Units 0   azelastine (OPTIVAR) 0.05 % ophthalmic solution Apply 1 drop to eye 2 (two) times daily as needed.     buPROPion (WELLBUTRIN XL) 150 MG 24 hr tablet TAKE 1 TABLET(150 MG) BY MOUTH DAILY 90 tablet 0   citalopram (CELEXA) 20 MG tablet TAKE 1 TABLET(20 MG) BY MOUTH DAILY 90 tablet 1   ezetimibe-simvastatin (VYTORIN) 10-40 MG tablet Take 1 tablet by mouth at bedtime.     loperamide (IMODIUM A-D) 2 MG tablet Use a 1/2 tablet every morning and titrate as needed. 30 tablet 0   Multiple Vitamin (MULTIVITAMIN ADULT PO) Take by mouth.     MYRBETRIQ 25 MG TB24 tablet Take 25 mg by mouth daily.     omeprazole (PRILOSEC) 20 MG capsule TAKE  1 CAPSULE BY MOUTH EVERY DAY 90 capsule 1   SUMAtriptan (IMITREX) 100 MG tablet TAKE 1 TABLET BY MOUTH AS NEEDED FOR HEADACHE 9 tablet 6   tamsulosin (FLOMAX) 0.4 MG CAPS capsule Take 0.4 mg by mouth daily.     topiramate (TOPAMAX) 200 MG tablet TAKE 1 TABLET(200 MG) BY MOUTH AT BEDTIME 90 tablet 1   No current facility-administered medications on file prior to visit.    Past Medical History:  Diagnosis Date   Allergy    Colon polyps 2016   Depression    GERD (gastroesophageal reflux disease)    Heart murmur    Hyperlipidemia    IBS (irritable bowel syndrome)    Kidney stones    stage 3    Memory loss    Prostate cancer (HCC) 2003   sx 2003 and radation   Sleep apnea    no cpap per pt   Sleep apnea with use of continuous positive airway  pressure (CPAP)    No Active Allergies  Social History   Socioeconomic History   Marital status: Married    Spouse name: Not on file   Number of children: 1   Years of education: Not on file   Highest education level: 12th grade  Occupational History   Occupation: Retired  Tobacco Use   Smoking status: Former    Current packs/day: 0.00    Types: Cigarettes    Quit date: 08/04/1993    Years since quitting: 30.0   Smokeless tobacco: Never  Vaping Use   Vaping status: Never Used  Substance and Sexual Activity   Alcohol use: No   Drug use: No   Sexual activity: Yes  Other Topics Concern   Not on file  Social History Narrative   Not on file   Social Drivers of Health   Financial Resource Strain: Low Risk  (05/28/2023)   Overall Financial Resource Strain (CARDIA)    Difficulty of Paying Living Expenses: Not hard at all  Food Insecurity: No Food Insecurity (07/20/2023)   Hunger Vital Sign    Worried About Running Out of Food in the Last Year: Never true    Ran Out of Food in the Last Year: Never true  Transportation Needs: No Transportation Needs (07/20/2023)   PRAPARE - Administrator, Civil Service (Medical): No     Lack of Transportation (Non-Medical): No  Physical Activity: Unknown (05/28/2023)   Exercise Vital Sign    Days of Exercise per Week: 2 days    Minutes of Exercise per Session: Patient declined  Stress: Stress Concern Present (05/28/2023)   [employer] of Occupational Health - Occupational Stress Questionnaire    Feeling of Stress : To some extent  Social Connections: Unknown (05/28/2023)   Social Connection and Isolation Panel [NHANES]    Frequency of Communication with Friends and Family: Never    Frequency of Social Gatherings with Friends and Family: Patient declined    Attends Religious Services: More than 4 times per year    Active Member of [employer] or Organizations: Yes    Attends Banker Meetings: Never    Marital Status: Married   Vitals:   07/28/23 0845  BP: 110/60  Pulse: 76  Resp: 16  SpO2: 98%   Body mass index is 25.39 kg/m.  Physical Exam Vitals and nursing note reviewed.  Constitutional:      General: Luis Villegas is not in acute distress.    Appearance: Luis Villegas is well-developed.  HENT:     Head: Normocephalic and atraumatic.     Mouth/Throat:     Mouth: Mucous membranes are moist.     Pharynx: Oropharynx is clear. Uvula midline.  Eyes:     Conjunctiva/sclera: Conjunctivae normal.  Cardiovascular:     Rate and Rhythm: Normal rate and regular rhythm.     Pulses:          Posterior tibial pulses are 2+ on the right side and 2+ on the left side.     Heart sounds: No murmur heard.    Comments: Skin around PICC line with no erythema or edema. Pulmonary:     Effort: Pulmonary effort is normal. No respiratory distress.     Breath sounds: Normal breath sounds.  Abdominal:     Palpations: Abdomen is soft. There is no hepatomegaly or mass.     Tenderness: There is no abdominal tenderness.  Musculoskeletal:     Right lower leg: 1+ Pitting Edema present.  Left lower leg: 1+ Pitting Edema present.  Lymphadenopathy:     Cervical: No cervical  adenopathy.  Skin:    General: Skin is warm.     Findings: No erythema or rash.  Neurological:     Mental Status: Luis Villegas is alert and oriented to person, place, and time.     Cranial Nerves: No cranial nerve deficit.     Comments: Mildly unstable gait, not assisted.  Psychiatric:        Mood and Affect: Mood and affect normal.    ASSESSMENT AND PLAN:  Luis Villegas was seen today for hospital follow up.   Enterococcus faecalis infection Continue IV ampicillin until 08/04/2023 and start amoxicillin as instructed. Luis Villegas has an appointment with ID in 09/2023. Nurse visit scheduled for today to rule blood, Luis Villegas needs CBC and BMP weekly. Continue advancing diet as tolerated. Clearly instructed about warning signs.  Stage 3a chronic kidney disease (HCC) Renal function improved before discharge, EGFR > 60. Stressed importance of adequate hydration, low-salt diet, and avoidance of NSAIDs. BMP to be done weekly while on ampicillin infusion.  Nephrolithiasis Currently asymptomatic. Continue adequate hydration. Luis Villegas has an appointment with his urologist in 08/2023.  Unilateral inguinal hernia without obstruction or gangrene, recurrence not specified Small inguinal hernia seen on abdominal CT with fat content, currently asymptomatic. We discussed imaging report. Luis Villegas can discuss with PCP if surgical consultation is appropriate. Instructed about warning signs.  Chronic diarrhea Close to baseline. Continue adequate hydration with clear fluids. No changes in Imodium dose.  Chronic anemia  H/H otherwise stable. History of prostate cancer treated with radiation. Luis Villegas will have CBC weekly through [facility].  Return if symptoms worsen or fail to improve, for keep next appointment.  I, Rolla Etienne Wierda, acting as a scribe for Jonathon Castelo [place], MD., have documented all relevant documentation on the behalf of Arleth Mccullar [place], MD, as directed by  Destina Mantei [place], MD while in the presence of Leticia Mcdiarmid [place], MD.   I, Kattia Selley  [place], MD, have reviewed all documentation for this visit. The documentation on 07/28/23 for the exam, diagnosis, procedures, and orders are all accurate and complete.  Wrenn Willcox G. [place], MD  [facility]. [place] office.

## 2023-07-28 NOTE — Transitions of Care (Post Inpatient/ED Visit) (Signed)
   07/28/2023  Name: Luis Villegas MRN: [id number] DOB: 10-Jun-1946  Today's TOC FU Call Status: Today's TOC FU Call Status:: Unsuccessful Call (2nd Attempt) Unsuccessful Call (2nd Attempt) Date: 07/28/23  Attempted to reach the patient regarding the most recent Inpatient/ED visit.  Follow Up Plan: Additional outreach attempts will be made to reach the patient to complete the Transitions of Care (Post Inpatient/ED visit) call.     Antionette Fairy, RN,BSN,CCM RN Care Manager Transitions of Care  -[facility]BC[facility]/Population Health  Direct Phone: [phone] Toll Free: [phone] Fax: [phone]

## 2023-07-29 LAB — LAB REPORT - SCANNED: EGFR: 50

## 2023-07-30 ENCOUNTER — Telehealth: Payer: Self-pay

## 2023-07-30 ENCOUNTER — Encounter: Payer: Self-pay | Admitting: Infectious Disease

## 2023-07-30 DIAGNOSIS — A4181 Sepsis due to Enterococcus: Secondary | ICD-10-CM | POA: Diagnosis not present

## 2023-07-30 DIAGNOSIS — M51369 Other intervertebral disc degeneration, lumbar region without mention of lumbar back pain or lower extremity pain: Secondary | ICD-10-CM | POA: Diagnosis not present

## 2023-07-30 DIAGNOSIS — I700 Atherosclerosis of aorta: Secondary | ICD-10-CM | POA: Diagnosis not present

## 2023-07-30 DIAGNOSIS — D631 Anemia in chronic kidney disease: Secondary | ICD-10-CM | POA: Diagnosis not present

## 2023-07-30 DIAGNOSIS — N1831 Chronic kidney disease, stage 3a: Secondary | ICD-10-CM | POA: Diagnosis not present

## 2023-07-30 DIAGNOSIS — I080 Rheumatic disorders of both mitral and aortic valves: Secondary | ICD-10-CM | POA: Diagnosis not present

## 2023-07-30 NOTE — Telephone Encounter (Signed)
Called and gave VO in VM.    Copied from CRM [id number]. Topic: Clinical - Home Health Verbal Orders >> Jul 30, 2023 11:33 AM Fuller Mandril wrote: Caller/Agency: [facility] - PT Callback Number: [phone] Service Requested: N/A : Physical Therapy - No PT needed - Eval only  Frequency: N/A - Caller saw pt today and stated eval only no Pt needs and wanted to report findings as follows. Pt no longer taking medications that are on his list - buPROPion (WELLBUTRIN XL) 150 MG 24 hr tablet, MYRBETRIQ 25 MG TB24 table, omeprazole (PRILOSEC) 20 MG capsule, tamsulosin (FLOMAX) 0.4 MG CAPS capsule. Pt is currently taking b12 that is not listed. Bp values: seated 100/60 standing 80/50 after min returned back to base line throughout no symptoms  Any new concerns about the patient? N/A

## 2023-08-04 DIAGNOSIS — N1831 Chronic kidney disease, stage 3a: Secondary | ICD-10-CM | POA: Diagnosis not present

## 2023-08-04 DIAGNOSIS — Z452 Encounter for adjustment and management of vascular access device: Secondary | ICD-10-CM | POA: Diagnosis not present

## 2023-08-04 DIAGNOSIS — I080 Rheumatic disorders of both mitral and aortic valves: Secondary | ICD-10-CM | POA: Diagnosis not present

## 2023-08-04 DIAGNOSIS — A4181 Sepsis due to Enterococcus: Secondary | ICD-10-CM | POA: Diagnosis not present

## 2023-08-04 DIAGNOSIS — I700 Atherosclerosis of aorta: Secondary | ICD-10-CM | POA: Diagnosis not present

## 2023-08-04 DIAGNOSIS — M51369 Other intervertebral disc degeneration, lumbar region without mention of lumbar back pain or lower extremity pain: Secondary | ICD-10-CM | POA: Diagnosis not present

## 2023-08-04 DIAGNOSIS — D631 Anemia in chronic kidney disease: Secondary | ICD-10-CM | POA: Diagnosis not present

## 2023-08-05 LAB — LAB REPORT - SCANNED: EGFR: 57

## 2023-08-06 DIAGNOSIS — M51369 Other intervertebral disc degeneration, lumbar region without mention of lumbar back pain or lower extremity pain: Secondary | ICD-10-CM | POA: Diagnosis not present

## 2023-08-06 DIAGNOSIS — I080 Rheumatic disorders of both mitral and aortic valves: Secondary | ICD-10-CM | POA: Diagnosis not present

## 2023-08-06 DIAGNOSIS — A4181 Sepsis due to Enterococcus: Secondary | ICD-10-CM | POA: Diagnosis not present

## 2023-08-06 DIAGNOSIS — N1831 Chronic kidney disease, stage 3a: Secondary | ICD-10-CM | POA: Diagnosis not present

## 2023-08-06 DIAGNOSIS — D631 Anemia in chronic kidney disease: Secondary | ICD-10-CM | POA: Diagnosis not present

## 2023-08-06 DIAGNOSIS — I700 Atherosclerosis of aorta: Secondary | ICD-10-CM | POA: Diagnosis not present

## 2023-08-09 ENCOUNTER — Encounter: Payer: Self-pay | Admitting: Infectious Disease

## 2023-08-10 ENCOUNTER — Other Ambulatory Visit: Payer: Self-pay

## 2023-08-10 DIAGNOSIS — I080 Rheumatic disorders of both mitral and aortic valves: Secondary | ICD-10-CM | POA: Diagnosis not present

## 2023-08-10 DIAGNOSIS — N1831 Chronic kidney disease, stage 3a: Secondary | ICD-10-CM | POA: Diagnosis not present

## 2023-08-10 DIAGNOSIS — I700 Atherosclerosis of aorta: Secondary | ICD-10-CM | POA: Diagnosis not present

## 2023-08-10 DIAGNOSIS — M51369 Other intervertebral disc degeneration, lumbar region without mention of lumbar back pain or lower extremity pain: Secondary | ICD-10-CM | POA: Diagnosis not present

## 2023-08-10 DIAGNOSIS — A4181 Sepsis due to Enterococcus: Secondary | ICD-10-CM | POA: Diagnosis not present

## 2023-08-10 DIAGNOSIS — D631 Anemia in chronic kidney disease: Secondary | ICD-10-CM | POA: Diagnosis not present

## 2023-08-10 MED ORDER — ONDANSETRON HCL 4 MG PO TABS: 4.0000 mg | ORAL_TABLET | Freq: Three times a day (TID) | ORAL | 0 refills | Status: DC | PRN

## 2023-08-11 ENCOUNTER — Telehealth: Payer: Self-pay | Admitting: Neurology

## 2023-08-11 NOTE — Telephone Encounter (Signed)
 Pt 's Luis Villegas cancelled appt due was hospitalized with bacterical infection. Want to take care of that first before rescheduling.

## 2023-08-12 ENCOUNTER — Encounter: Payer: Self-pay | Admitting: Infectious Disease

## 2023-08-21 DIAGNOSIS — F32A Depression, unspecified: Secondary | ICD-10-CM

## 2023-08-21 DIAGNOSIS — I10 Essential (primary) hypertension: Secondary | ICD-10-CM

## 2023-08-21 DIAGNOSIS — I700 Atherosclerosis of aorta: Secondary | ICD-10-CM

## 2023-08-21 DIAGNOSIS — N200 Calculus of kidney: Secondary | ICD-10-CM

## 2023-08-21 DIAGNOSIS — K219 Gastro-esophageal reflux disease without esophagitis: Secondary | ICD-10-CM

## 2023-08-21 DIAGNOSIS — G4730 Sleep apnea, unspecified: Secondary | ICD-10-CM

## 2023-08-21 DIAGNOSIS — A4181 Sepsis due to Enterococcus: Secondary | ICD-10-CM

## 2023-08-21 DIAGNOSIS — Z87891 Personal history of nicotine dependence: Secondary | ICD-10-CM

## 2023-08-21 DIAGNOSIS — I080 Rheumatic disorders of both mitral and aortic valves: Secondary | ICD-10-CM

## 2023-08-21 DIAGNOSIS — K589 Irritable bowel syndrome without diarrhea: Secondary | ICD-10-CM

## 2023-08-21 DIAGNOSIS — Z452 Encounter for adjustment and management of vascular access device: Secondary | ICD-10-CM

## 2023-08-21 DIAGNOSIS — Z792 Long term (current) use of antibiotics: Secondary | ICD-10-CM

## 2023-08-21 DIAGNOSIS — D631 Anemia in chronic kidney disease: Secondary | ICD-10-CM

## 2023-08-21 DIAGNOSIS — Z9181 History of falling: Secondary | ICD-10-CM

## 2023-08-21 DIAGNOSIS — D696 Thrombocytopenia, unspecified: Secondary | ICD-10-CM

## 2023-08-21 DIAGNOSIS — K4090 Unilateral inguinal hernia, without obstruction or gangrene, not specified as recurrent: Secondary | ICD-10-CM

## 2023-08-21 DIAGNOSIS — Z981 Arthrodesis status: Secondary | ICD-10-CM

## 2023-08-21 DIAGNOSIS — M51369 Other intervertebral disc degeneration, lumbar region without mention of lumbar back pain or lower extremity pain: Secondary | ICD-10-CM

## 2023-08-21 DIAGNOSIS — N1831 Chronic kidney disease, stage 3a: Secondary | ICD-10-CM

## 2023-08-21 DIAGNOSIS — Z8546 Personal history of malignant neoplasm of prostate: Secondary | ICD-10-CM

## 2023-08-21 DIAGNOSIS — E785 Hyperlipidemia, unspecified: Secondary | ICD-10-CM

## 2023-08-30 ENCOUNTER — Encounter: Payer: Self-pay | Admitting: Infectious Disease

## 2023-09-01 ENCOUNTER — Ambulatory Visit: Payer: Medicare Other | Admitting: Neurology

## 2023-09-01 ENCOUNTER — Ambulatory Visit: Payer: Self-pay | Admitting: Infectious Disease

## 2023-09-03 DIAGNOSIS — N3941 Urge incontinence: Secondary | ICD-10-CM | POA: Diagnosis not present

## 2023-09-03 DIAGNOSIS — N210 Calculus in bladder: Secondary | ICD-10-CM | POA: Diagnosis not present

## 2023-09-03 DIAGNOSIS — C61 Malignant neoplasm of prostate: Secondary | ICD-10-CM | POA: Diagnosis not present

## 2023-09-03 DIAGNOSIS — N200 Calculus of kidney: Secondary | ICD-10-CM | POA: Diagnosis not present

## 2023-09-07 NOTE — Progress Notes (Signed)
 Subjective:    Patient ID: Luis Villegas, male    DOB: 13-Oct-1945, 78 y.o.   MRN: [id number]  HPI  78 y.o. male with E faecalis bacteremia with sepsis  but no endocarditis on TEE who complete 2 weeks of high dose AMP followed by 2 weeks of high dose amoxicillin .  Discussed the use of AI scribe software for clinical note transcription with the patient, who gave verbal consent to proceed.  History of Present Illness   The patient, with a history of enterococcus faecalis bloodstream infection, presents for a follow-up visit. He was previously found unresponsive by his spouse after returning from a trip to [place]. The infection was severe and potentially life-threatening, but was successfully treated with two weeks of ampicillin  followed by two weeks of high-dose amoxicillin , which was completed in mid-January. The patient reports no current symptoms related to the infection. TEE was negative for endoscarditis  The patient also mentions an upcoming dental procedure, specifically the placement of a new crown. He was advised by his dentist to seek permission from his infectious disease specialist prior to proceeding with the procedure due to his recent serious infection.        Past Medical History:  Diagnosis Date   Allergy    Colon polyps 2016   Depression    GERD (gastroesophageal reflux disease)    Heart murmur    Hyperlipidemia    IBS (irritable bowel syndrome)    Kidney stones    stage 3    Memory loss    Prostate cancer (HCC) 2003   sx 2003 and radation   Sleep apnea    no cpap per pt   Sleep apnea with use of continuous positive airway pressure (CPAP)     Past Surgical History:  Procedure Laterality Date   CARPAL TUNNEL RELEASE Bilateral    CERVICAL FUSION     COLONOSCOPY  2007, 08/09/2014   2007 [place]/ 2016 [place]   COLONOSCOPY W/ POLYPECTOMY  2002   POLYPECTOMY  2016   polyps x3 T.A.   PROSTATECTOMY  2003   Dr.Davis   TONSILLECTOMY      TRANSESOPHAGEAL ECHOCARDIOGRAM (CATH LAB) N/A 07/22/2023   Procedure: TRANSESOPHAGEAL ECHOCARDIOGRAM;  Surgeon: Delford Maude BROCKS, MD;  Location: [facility] INVASIVE CV LAB;  Service: Cardiovascular;  Laterality: N/A;    Family History  Problem Relation Age of Onset   Stroke Father 79   Bladder Cancer Father    Breast cancer Mother    Diabetes Mother    Neuropathy Mother    Valvular heart disease Mother    Heart attack Mother 21   Prostate cancer Brother    Heart disease Brother    Heart attack Brother    Prostate cancer Brother    Colon cancer Neg Hx    Colon polyps Neg Hx    Esophageal cancer Neg Hx    Stomach cancer Neg Hx    Rectal cancer Neg Hx       Social History   Socioeconomic History   Marital status: Married    Spouse name: Not on file   Number of children: 1   Years of education: Not on file   Highest education level: 12th grade  Occupational History   Occupation: Retired  Tobacco Use   Smoking status: Former    Current packs/day: 0.00    Types: Cigarettes    Quit date: 08/04/1993    Years since quitting: 30.1   Smokeless tobacco: Never  Vaping Use  Vaping status: Never Used  Substance and Sexual Activity   Alcohol use: No   Drug use: No   Sexual activity: Yes  Other Topics Concern   Not on file  Social History Narrative   Not on file   Social Drivers of Health   Financial Resource Strain: Low Risk  (05/28/2023)   Overall Financial Resource Strain (CARDIA)    Difficulty of Paying Living Expenses: Not hard at all  Food Insecurity: No Food Insecurity (07/20/2023)   Hunger Vital Sign    Worried About Running Out of Food in the Last Year: Never true    Ran Out of Food in the Last Year: Never true  Transportation Needs: No Transportation Needs (07/20/2023)   PRAPARE - Administrator, Civil Service (Medical): No    Lack of Transportation (Non-Medical): No  Physical Activity: Unknown (05/28/2023)   Exercise Vital Sign    Days of Exercise per  Week: 2 days    Minutes of Exercise per Session: Patient declined  Stress: Stress Concern Present (05/28/2023)   [employer] of Occupational Health - Occupational Stress Questionnaire    Feeling of Stress : To some extent  Social Connections: Unknown (05/28/2023)   Social Connection and Isolation Panel [NHANES]    Frequency of Communication with Friends and Family: Never    Frequency of Social Gatherings with Friends and Family: Patient declined    Attends Religious Services: Not on [employer] or Organizations: Yes    Attends Banker Meetings: Never    Marital Status: Married    No Active Allergies   Current Outpatient Medications:    acyclovir  (ZOVIRAX ) 200 MG capsule, TAKE 1 CAPSULE(200 MG) BY MOUTH DAILY (Patient taking differently: Take 200 mg by mouth daily as needed.), Disp: 90 capsule, Rfl: 1   azelastine (OPTIVAR) 0.05 % ophthalmic solution, Apply 1 drop to eye 2 (two) times daily as needed., Disp: , Rfl:    buPROPion  (WELLBUTRIN  XL) 150 MG 24 hr tablet, TAKE 1 TABLET(150 MG) BY MOUTH DAILY, Disp: 90 tablet, Rfl: 0   citalopram  (CELEXA ) 20 MG tablet, TAKE 1 TABLET(20 MG) BY MOUTH DAILY, Disp: 90 tablet, Rfl: 1   ezetimibe -simvastatin  (VYTORIN ) 10-40 MG tablet, Take 1 tablet by mouth at bedtime., Disp: , Rfl:    loperamide  (IMODIUM  A-D) 2 MG tablet, Use a 1/2 tablet every morning and titrate as needed., Disp: 30 tablet, Rfl: 0   Multiple Vitamin (MULTIVITAMIN ADULT PO), Take by mouth., Disp: , Rfl:    MYRBETRIQ  25 MG TB24 tablet, Take 25 mg by mouth daily., Disp: , Rfl:    omeprazole  (PRILOSEC) 20 MG capsule, TAKE 1 CAPSULE BY MOUTH EVERY DAY, Disp: 90 capsule, Rfl: 1   ondansetron  (ZOFRAN ) 4 MG tablet, Take 1 tablet (4 mg total) by mouth every 8 (eight) hours as needed for nausea or vomiting., Disp: 90 tablet, Rfl: 0   SUMAtriptan  (IMITREX ) 100 MG tablet, TAKE 1 TABLET BY MOUTH AS NEEDED FOR HEADACHE, Disp: 9 tablet, Rfl: 6   tamsulosin   (FLOMAX ) 0.4 MG CAPS capsule, Take 0.4 mg by mouth daily., Disp: , Rfl:    topiramate  (TOPAMAX ) 200 MG tablet, TAKE 1 TABLET(200 MG) BY MOUTH AT BEDTIME, Disp: 90 tablet, Rfl: 1   Review of Systems  Constitutional:  Negative for activity change, appetite change, chills, diaphoresis, fatigue, fever and unexpected weight change.  HENT:  Negative for congestion, rhinorrhea, sinus pressure, sneezing, sore throat and trouble swallowing.   Eyes:  Negative for photophobia and visual disturbance.  Respiratory:  Negative for cough, chest tightness, shortness of breath, wheezing and stridor.   Cardiovascular:  Negative for chest pain, palpitations and leg swelling.  Gastrointestinal:  Negative for abdominal distention, abdominal pain, anal bleeding, blood in stool, constipation, diarrhea, nausea and vomiting.  Genitourinary:  Negative for difficulty urinating, dysuria, flank pain and hematuria.  Musculoskeletal:  Negative for arthralgias, back pain, gait problem, joint swelling and myalgias.  Skin:  Negative for color change, pallor, rash and wound.  Neurological:  Negative for dizziness, tremors, weakness and light-headedness.  Hematological:  Negative for adenopathy. Does not bruise/bleed easily.  Psychiatric/Behavioral:  Negative for agitation, behavioral problems, confusion, decreased concentration, dysphoric mood and sleep disturbance.        Objective:   Physical Exam Constitutional:      Appearance: He is well-developed.  HENT:     Head: Normocephalic and atraumatic.  Eyes:     Conjunctiva/sclera: Conjunctivae normal.  Cardiovascular:     Rate and Rhythm: Normal rate and regular rhythm.  Pulmonary:     Effort: Pulmonary effort is normal. No respiratory distress.     Breath sounds: No wheezing.  Abdominal:     General: There is no distension.     Palpations: Abdomen is soft.  Musculoskeletal:        General: No tenderness. Normal range of motion.     Cervical back: Normal range of  motion and neck supple.  Skin:    General: Skin is warm and dry.     Coloration: Skin is not pale.     Findings: No erythema or rash.  Neurological:     General: No focal deficit present.     Mental Status: He is alert and oriented to person, place, and time.  Psychiatric:        Mood and Affect: Mood normal.        Behavior: Behavior normal.        Thought Content: Thought content normal.        Judgment: Judgment normal.           Assessment & Plan:   Assessment and Plan    Enterococcus faecalis bloodstream infection Successfully treated with two weeks of ampicillin  followed by two weeks of high dose amoxicillin , which was completed mid-January. No current symptoms suggestive of recurrence. -Order blood cultures today to confirm resolution of infection.  Dental procedure Upcoming dental procedure (crown placement). Dentist requested clearance. -Granted clearance for dental procedure. Will note in medical record.  Immunizations Received flu shot. Has not received updated COVID-19 vaccine. -Administer updated COVID-19 vaccine today.   CKD: stable GFR

## 2023-09-08 ENCOUNTER — Other Ambulatory Visit: Payer: Self-pay

## 2023-09-08 ENCOUNTER — Ambulatory Visit (INDEPENDENT_AMBULATORY_CARE_PROVIDER_SITE_OTHER): Payer: Medicare Other | Admitting: Infectious Disease

## 2023-09-08 ENCOUNTER — Encounter: Payer: Self-pay | Admitting: Infectious Disease

## 2023-09-08 VITALS — BP 124/74 | HR 66 | Temp 97.8°F | Ht 68.0 in | Wt 155.0 lb

## 2023-09-08 DIAGNOSIS — Z9189 Other specified personal risk factors, not elsewhere classified: Secondary | ICD-10-CM | POA: Diagnosis not present

## 2023-09-08 DIAGNOSIS — N1831 Chronic kidney disease, stage 3a: Secondary | ICD-10-CM | POA: Diagnosis not present

## 2023-09-08 DIAGNOSIS — R7881 Bacteremia: Secondary | ICD-10-CM | POA: Diagnosis not present

## 2023-09-08 DIAGNOSIS — A498 Other bacterial infections of unspecified site: Secondary | ICD-10-CM

## 2023-09-08 DIAGNOSIS — C61 Malignant neoplasm of prostate: Secondary | ICD-10-CM

## 2023-09-08 DIAGNOSIS — Z7185 Encounter for immunization safety counseling: Secondary | ICD-10-CM

## 2023-09-10 ENCOUNTER — Other Ambulatory Visit: Payer: Self-pay | Admitting: Family Medicine

## 2023-09-11 ENCOUNTER — Encounter: Payer: Self-pay | Admitting: Family Medicine

## 2023-09-11 ENCOUNTER — Encounter: Payer: Self-pay | Admitting: Infectious Disease

## 2023-09-11 MED ORDER — ACYCLOVIR 200 MG PO CAPS: ORAL_CAPSULE | ORAL | 0 refills | Status: DC

## 2023-09-11 NOTE — Addendum Note (Signed)
 Addended by: Georga Killings A on: 09/11/2023 02:47 PM   Modules accepted: Orders

## 2023-09-14 LAB — CULTURE, BLOOD (SINGLE)
MICRO NUMBER:: 16040764
MICRO NUMBER:: 16040765
Result:: NO GROWTH
Result:: NO GROWTH
SPECIMEN QUALITY:: ADEQUATE
SPECIMEN QUALITY:: ADEQUATE

## 2023-09-23 ENCOUNTER — Encounter: Payer: Medicare Other | Admitting: Family Medicine

## 2023-09-25 ENCOUNTER — Other Ambulatory Visit: Payer: Self-pay | Admitting: Family Medicine

## 2023-09-30 ENCOUNTER — Encounter: Payer: Self-pay | Admitting: Family Medicine

## 2023-09-30 ENCOUNTER — Ambulatory Visit (INDEPENDENT_AMBULATORY_CARE_PROVIDER_SITE_OTHER): Payer: Medicare Other | Admitting: Family Medicine

## 2023-09-30 VITALS — BP 110/64 | HR 67 | Temp 98.4°F | Ht 68.0 in | Wt 159.0 lb

## 2023-09-30 DIAGNOSIS — H04203 Unspecified epiphora, bilateral lacrimal glands: Secondary | ICD-10-CM | POA: Diagnosis not present

## 2023-09-30 DIAGNOSIS — N1831 Chronic kidney disease, stage 3a: Secondary | ICD-10-CM | POA: Diagnosis not present

## 2023-09-30 DIAGNOSIS — J302 Other seasonal allergic rhinitis: Secondary | ICD-10-CM | POA: Diagnosis not present

## 2023-09-30 DIAGNOSIS — R0981 Nasal congestion: Secondary | ICD-10-CM

## 2023-09-30 DIAGNOSIS — E782 Mixed hyperlipidemia: Secondary | ICD-10-CM

## 2023-09-30 MED ORDER — FEXOFENADINE HCL 180 MG PO TABS
180.0000 mg | ORAL_TABLET | Freq: Every day | ORAL | 1 refills | Status: AC | PRN
Start: 2023-09-30 — End: ?

## 2023-09-30 MED ORDER — EZETIMIBE-SIMVASTATIN 10-40 MG PO TABS: 1.0000 | ORAL_TABLET | Freq: Every day | ORAL | 3 refills | Status: AC

## 2023-09-30 NOTE — Progress Notes (Addendum)
 Established Patient Office Visit   Subjective  Patient ID: Luis Villegas, male    DOB: Jul 05, 1946  Age: 78 y.o. MRN: [id number]  Chief Complaint  Patient presents with   Nasal Congestion   Follow-up    Patient is a 78 year old male seen for follow-up and nasal congestion.  Patient endorses nasal congestion, watery, itchy eyes, ear pressure.  Having to blow nose more in the morning.  Patient denies ear pain/facial pain/pressure, sore throat.  In the past tried Benadryl which cause drowsiness.  Patient states he is slowly feeling better since being in the hospital for bacteremia.  Patient still unsure of cause of symptoms.  Completed antibiotic course.  Had recent follow-up with ID.  Patient concerned about renal function.  States drinking plenty of water daily.  Per chart review eGFR 57 on 08/05/2023.  Patient requesting refill on Vytorin.  States was unable to obtain from pharmacy.     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   Chronic kidney disease (CKD), stage III (moderate) (HCC) 04/15/2019   VENTRAL HERNIA 11/23/2008   History of colonic polyps 11/23/2008   HYPERLIPIDEMIA 11/11/2007   GERD 11/11/2007   Sleep apnea 11/11/2007   PROSTATE CANCER, HX OF 11/11/2007   ANXIETY STATE NOS 05/18/2007   MIGRAINE NEC W/INTRACTABLE MIGRAINE 03/23/2007   ANEMIA-NOS 09/23/2006   Past Medical History:  Diagnosis Date   Allergy    Arthritis    Cataract    Colon polyps 2016   Depression    GERD (gastroesophageal reflux disease)    Heart murmur    Hyperlipidemia    IBS (irritable bowel syndrome)    Kidney stones    stage 3    Memory  loss    Prostate cancer (HCC) 2003   sx 2003 and radation   Sleep apnea    no cpap per pt   Sleep apnea with use of continuous positive airway pressure (CPAP)    Past Surgical History:  Procedure Laterality Date   CARPAL TUNNEL RELEASE Bilateral    CERVICAL FUSION     COLONOSCOPY  2007, 08/09/2014   2007 [facility]/ 2016 [place]   COLONOSCOPY W/ POLYPECTOMY  2002   HERNIA REPAIR     POLYPECTOMY  2016   polyps x3 T.A.   PROSTATECTOMY  2003   Dr.Davis   TONSILLECTOMY     TRANSESOPHAGEAL ECHOCARDIOGRAM (CATH LAB) N/A 07/22/2023   Procedure: TRANSESOPHAGEAL ECHOCARDIOGRAM;  Surgeon: Wendall Stade, MD;  Location: [facility] INVAS[facility] CV LAB;  Service: Cardiovascular;  Laterality: N/A;   Social History   Tobacco Use   Smoking status: Former    Current packs/day: 0.00    Types: Cigarettes    Quit date: 08/04/1993    Years since quitting: 30.1   Smokeless tobacco: Never  Vaping Use   Vaping status: Never Used  Substance Use Topics   Alcohol use: No   Drug use: No   Family History  Problem Relation Age of Onset   Stroke Father 56   Bladder Cancer Father    Breast cancer Mother    Diabetes Mother    Neuropathy Mother    Valvular heart disease  Mother    Heart attack Mother 30   Cancer Mother    Prostate cancer Brother    Heart disease Brother    Heart attack Brother    Prostate cancer Brother    Colon cancer Neg Hx    Colon polyps Neg Hx    Esophageal cancer Neg Hx    Stomach cancer Neg Hx    Rectal cancer Neg Hx    No Known Allergies    ROS Negative unless stated above    Objective:     BP 110/64 (BP Location: Left Arm, Patient Position: Sitting, Cuff Size: Normal)   Pulse 67   Temp 98.4 F (36.9 C) (Oral)   Ht 5\' 8"  (1.727 m)   Wt 159 lb (72.1 kg)   SpO2 97%   BMI 24.18 kg/m  BP Readings from Last 3 Encounters:  09/30/23 110/64  09/08/23 124/74  07/28/23 110/60   Wt Readings from Last 3 Encounters:  09/30/23 159 lb (72.1 kg)  09/08/23 155 lb (70.3 kg)   07/28/23 167 lb (75.8 kg)      Physical Exam Constitutional:      General: He is not in acute distress.    Appearance: Normal appearance.     Comments: HOH, not wearing hearing aids.  HENT:     Head: Normocephalic and atraumatic.     Nose: Nose normal.     Mouth/Throat:     Mouth: Mucous membranes are moist.     Pharynx: Postnasal drip present.  Cardiovascular:     Rate and Rhythm: Normal rate and regular rhythm.     Heart sounds: Normal heart sounds. No murmur heard.    No gallop.  Pulmonary:     Effort: Pulmonary effort is normal. No respiratory distress.     Breath sounds: Normal breath sounds. No wheezing, rhonchi or rales.  Skin:    General: Skin is warm and dry.  Neurological:     Mental Status: He is alert and oriented to person, place, and time.      No results found for any visits on 09/30/23.    Assessment & Plan:  Seasonal allergies -     Fexofenadine HCl; Take 1 tablet (180 mg total) by mouth daily as needed for allergies or rhinitis (congestion, watery eyes, sneezing).  Dispense: 90 tablet; Refill: 1  Nasal congestion -     Fexofenadine HCl; Take 1 tablet (180 mg total) by mouth daily as needed for allergies or rhinitis (congestion, watery eyes, sneezing).  Dispense: 90 tablet; Refill: 1  Watery eyes -     Fexofenadine HCl; Take 1 tablet (180 mg total) by mouth daily as needed for allergies or rhinitis (congestion, watery eyes, sneezing).  Dispense: 90 tablet; Refill: 1  Mixed hyperlipidemia -     Ezetimibe-Simvastatin; Take 1 tablet by mouth at bedtime.  Dispense: 90 tablet; Refill: 3 -     Lipid panel; Future  Stage 3a chronic kidney disease (HCC)  Patient seen for acute concern.  Nasal congestion, watery eyes, postnasal drainage likely 2/2 allergies.  Discussed starting antihistamine as needed.  Continue saline nasal rinse.  Vytorin refilled.  Lipid panel ordered.  Patient can have done when fasting.  CKD 3A stable as eGFR 57 on 08/05/2023.  Return  in about 3 months (around 12/28/2023) for chronic conditions.   Deeann Saint, MD

## 2023-10-07 ENCOUNTER — Other Ambulatory Visit: Payer: Medicare Other

## 2023-10-09 ENCOUNTER — Other Ambulatory Visit

## 2023-10-09 DIAGNOSIS — E782 Mixed hyperlipidemia: Secondary | ICD-10-CM

## 2023-10-09 LAB — LIPID PANEL
Cholesterol: 157 mg/dL (ref 0–200)
HDL: 46.1 mg/dL (ref >39.00)
LDL Cholesterol: 75 mg/dL (ref 0–99)
NonHDL: 110.72
Total CHOL/HDL Ratio: 3
Triglycerides: 177 mg/dL — ABNORMAL HIGH (ref 0.0–149.0)
VLDL: 35.4 mg/dL (ref 0.0–40.0)

## 2023-10-15 ENCOUNTER — Encounter: Payer: Self-pay | Admitting: Family Medicine

## 2023-10-15 DIAGNOSIS — R9721 Rising PSA following treatment for malignant neoplasm of prostate: Secondary | ICD-10-CM | POA: Diagnosis not present

## 2023-10-21 DIAGNOSIS — C61 Malignant neoplasm of prostate: Secondary | ICD-10-CM | POA: Diagnosis not present

## 2023-10-21 DIAGNOSIS — N210 Calculus in bladder: Secondary | ICD-10-CM | POA: Diagnosis not present

## 2023-10-21 DIAGNOSIS — N200 Calculus of kidney: Secondary | ICD-10-CM | POA: Diagnosis not present

## 2023-11-11 ENCOUNTER — Ambulatory Visit: Admitting: Orthopedic Surgery

## 2023-11-26 ENCOUNTER — Other Ambulatory Visit: Payer: Self-pay | Admitting: Family Medicine

## 2023-12-22 ENCOUNTER — Other Ambulatory Visit: Payer: Self-pay | Admitting: Family Medicine

## 2023-12-29 ENCOUNTER — Other Ambulatory Visit: Payer: Self-pay | Admitting: Family Medicine

## 2023-12-30 ENCOUNTER — Encounter: Payer: Self-pay | Admitting: Family Medicine

## 2023-12-30 ENCOUNTER — Ambulatory Visit: Payer: Self-pay | Admitting: Family Medicine

## 2023-12-30 ENCOUNTER — Ambulatory Visit (INDEPENDENT_AMBULATORY_CARE_PROVIDER_SITE_OTHER): Payer: Medicare Other | Admitting: Family Medicine

## 2023-12-30 ENCOUNTER — Ambulatory Visit (INDEPENDENT_AMBULATORY_CARE_PROVIDER_SITE_OTHER)

## 2023-12-30 VITALS — BP 120/64 | HR 69 | Temp 98.4°F | Ht 68.0 in | Wt 167.8 lb

## 2023-12-30 DIAGNOSIS — W19XXXA Unspecified fall, initial encounter: Secondary | ICD-10-CM

## 2023-12-30 DIAGNOSIS — M25551 Pain in right hip: Secondary | ICD-10-CM | POA: Diagnosis not present

## 2023-12-30 DIAGNOSIS — R2689 Other abnormalities of gait and mobility: Secondary | ICD-10-CM

## 2023-12-30 NOTE — Progress Notes (Signed)
 Established Patient Office Visit   Subjective  Patient ID: Luis Villegas, male    DOB: [birth year]  Age: 78 y.o. MRN: [id number]  Chief Complaint  Patient presents with   Medical Management of Chronic Issues    3 month follow-up    Patient is a 78 year old male seen for follow-up.  Patient states he has had 2 falls since last OFV.  The initial fall happened while trying to trim a tree using a telescopic trimmer.  Patient states the trimmer became caught causing him to trip and fall.  Patient with right hip pain x 2 months since the incident.  The other fall occurred while out with his wife.  Patient states he walks regularly with his wife who does not seem to notice any change in his balance.  Pain noted with getting up from a seated position or sitting down/getting into bed.  Denies edema, popping.  Using [place] which seems to help.  Of note patient states his appetite has started to increase since last OFV.    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   Chronic kidney disease (CKD), stage III (moderate) (HCC) 04/15/2019   VENTRAL HERNIA 11/23/2008   History of colonic polyps 11/23/2008   HYPERLIPIDEMIA 11/11/2007   GERD 11/11/2007   Sleep apnea 11/11/2007   PROSTATE CANCER, HX OF 11/11/2007   ANXIETY STATE NOS 05/18/2007   MIGRAINE NEC W/INTRACTABLE MIGRAINE 03/23/2007   ANEMIA-NOS 09/23/2006   Past Medical History:  Diagnosis Date   Allergy    Arthritis    Cataract    Colon polyps 2016   Depression    GERD (gastroesophageal reflux disease)    Heart murmur    Hyperlipidemia    IBS (irritable bowel syndrome)    Kidney stones     stage 3    Memory loss    Prostate cancer (HCC) 2003   sx 2003 and radation   Sleep apnea    no cpap per pt   Sleep apnea with use of continuous positive airway pressure (CPAP)    Past Surgical History:  Procedure Laterality Date   CARPAL TUNNEL RELEASE Bilateral    CERVICAL FUSION     COLONOSCOPY  2007, 08/09/2014   2007 [facility]/ 2016 [place]   COLONOSCOPY W/ POLYPECTOMY  2002   HERNIA REPAIR     POLYPECTOMY  2016   polyps x3 T.A.   PROSTATECTOMY  2003   Dr.Davis   TONSILLECTOMY     TRANSESOPHAGEAL ECHOCARDIOGRAM (CATH LAB) N/A 07/22/2023   Procedure: TRANSESOPHAGEAL ECHOCARDIOGRAM;  Surgeon: Loyde Rule, MD;  Location: [facility] INVASIVE CV LAB;  Service: Cardiovascular;  Laterality: N/A;   Social History   Tobacco Use   Smoking status: Former    Current packs/day: 0.00    Types: Cigarettes    Quit date: 08/04/1993    Years since quitting: 30.4   Smokeless tobacco: Never  Vaping Use   Vaping status: Never Used  Substance Use Topics   Alcohol use: No   Drug use: No   Family History  Problem Relation Age of Onset   Stroke Father 33   Bladder Cancer Father  Breast cancer Mother    Diabetes Mother    Neuropathy Mother    Valvular heart disease Mother    Heart attack Mother 34   Cancer Mother    Prostate cancer Brother    Heart disease Brother    Heart attack Brother    Prostate cancer Brother    Colon cancer Neg Hx    Colon polyps Neg Hx    Esophageal cancer Neg Hx    Stomach cancer Neg Hx    Rectal cancer Neg Hx    No Known Allergies  ROS Negative unless stated above    Objective:      BP 120/64 (BP Location: Left Arm, Patient Position: Sitting, Cuff Size: Normal)   Pulse 69   Temp 98.4 F (36.9 C) (Oral)   Ht 5\' 8"  (1.727 m)   Wt 167 lb 12.8 oz (76.1 kg)   SpO2 97%   BMI 25.51 kg/m  BP Readings from Last 3 Encounters:  12/30/23 120/64  09/30/23 110/64  09/08/23 124/74   Wt Readings from Last 3 Encounters:  12/30/23 167 lb 12.8 oz  (76.1 kg)  09/30/23 159 lb (72.1 kg)  09/08/23 155 lb (70.3 kg)      Physical Exam Constitutional:      General: He is not in acute distress.    Appearance: Normal appearance.  HENT:     Head: Normocephalic and atraumatic.     Nose: Nose normal.     Mouth/Throat:     Mouth: Mucous membranes are moist.  Cardiovascular:     Rate and Rhythm: Normal rate and regular rhythm.     Heart sounds: Normal heart sounds. No murmur heard.    No gallop.  Pulmonary:     Effort: Pulmonary effort is normal. No respiratory distress.     Breath sounds: Normal breath sounds. No wheezing, rhonchi or rales.  Musculoskeletal:     Cervical back: Normal.     Thoracic back: Normal.     Lumbar back: Normal.       Back:     Comments: TTP of right lateral hip and posterior pelvis/upper buttock.  Negative straight leg raise, logroll, FADIR, FABER.  Decreased ROM of bilateral LEs.  Skin:    General: Skin is warm and dry.  Neurological:     Mental Status: He is alert and oriented to person, place, and time.        09/30/2023    2:09 PM 06/01/2023    3:38 PM 10/15/2022    1:26 PM  Depression screen PHQ 2/9  Decreased Interest 1 0 0  Down, Depressed, Hopeless 0 0 0  PHQ - 2 Score 1 0 0  Altered sleeping 0  2  Tired, decreased energy 0  2  Change in appetite 0  1  Feeling bad or failure about yourself  0  0  Trouble concentrating 0  0  Moving slowly or fidgety/restless 0  0  Suicidal thoughts 0  0  PHQ-9 Score 1  5  Difficult doing work/chores Not difficult at all  Not difficult at all      09/30/2023    2:10 PM 10/15/2022    1:27 PM 09/01/2022    2:00 PM 12/23/2017    9:09 AM  GAD 7 : Generalized Anxiety Score  Nervous, Anxious, on Edge 0 0 0 0  Control/stop worrying 0 0 0 0  Worry too much - different things 0 0 0 0  Trouble relaxing 0 0 0 0  Restless 0 0 0 0  Easily annoyed or irritable 0 0 0 0  Afraid - awful might happen 0 0 0 0  Total GAD 7 Score 0 0 0 0  Anxiety Difficulty Not  difficult at all Not difficult at all Not difficult at all      No results found for any visits on 12/30/23.    Assessment & Plan:   Right hip pain -     DG HIP UNILAT W OR W/O PELVIS 2-3 VIEWS RIGHT; Future -     Ambulatory referral to Home Health  Fall from standing, initial encounter -     Ambulatory referral to Home Health  Balance problem -     Ambulatory referral to Home Health  Right hip pain likely musculoskeletal.  Given continued symptoms x 2 months discussed obtaining imaging.  Continue supportive care with [place], Tylenol , heat, massage, stretching, etc.  Given recent falls and balance issues per patient referral to home health PT placed.  Patient encouraged to be mindful of diet choices since appetite has increased.  Continue medication for cholesterol.  Return if symptoms worsen or fail to improve.   Viola Greulich, MD

## 2023-12-30 NOTE — Patient Instructions (Signed)
 An x-ray of your right hip was placed.  Home health for physical therapy was also placed to help with your balance.  They will contact you regarding setting up visits.

## 2024-01-13 DIAGNOSIS — M25551 Pain in right hip: Secondary | ICD-10-CM | POA: Diagnosis not present

## 2024-01-13 DIAGNOSIS — G43909 Migraine, unspecified, not intractable, without status migrainosus: Secondary | ICD-10-CM | POA: Diagnosis not present

## 2024-01-13 DIAGNOSIS — G4730 Sleep apnea, unspecified: Secondary | ICD-10-CM | POA: Diagnosis not present

## 2024-01-13 DIAGNOSIS — Z87440 Personal history of urinary (tract) infections: Secondary | ICD-10-CM | POA: Diagnosis not present

## 2024-01-13 DIAGNOSIS — E785 Hyperlipidemia, unspecified: Secondary | ICD-10-CM | POA: Diagnosis not present

## 2024-01-13 DIAGNOSIS — F32A Depression, unspecified: Secondary | ICD-10-CM | POA: Diagnosis not present

## 2024-01-13 DIAGNOSIS — G8911 Acute pain due to trauma: Secondary | ICD-10-CM | POA: Diagnosis not present

## 2024-01-13 DIAGNOSIS — F411 Generalized anxiety disorder: Secondary | ICD-10-CM | POA: Diagnosis not present

## 2024-01-13 DIAGNOSIS — N183 Chronic kidney disease, stage 3 unspecified: Secondary | ICD-10-CM | POA: Diagnosis not present

## 2024-01-13 DIAGNOSIS — M1990 Unspecified osteoarthritis, unspecified site: Secondary | ICD-10-CM | POA: Diagnosis not present

## 2024-01-13 DIAGNOSIS — H269 Unspecified cataract: Secondary | ICD-10-CM | POA: Diagnosis not present

## 2024-01-13 DIAGNOSIS — K589 Irritable bowel syndrome without diarrhea: Secondary | ICD-10-CM | POA: Diagnosis not present

## 2024-01-13 DIAGNOSIS — Z9181 History of falling: Secondary | ICD-10-CM | POA: Diagnosis not present

## 2024-01-13 DIAGNOSIS — H9193 Unspecified hearing loss, bilateral: Secondary | ICD-10-CM | POA: Diagnosis not present

## 2024-01-13 DIAGNOSIS — Z87891 Personal history of nicotine dependence: Secondary | ICD-10-CM | POA: Diagnosis not present

## 2024-01-13 DIAGNOSIS — N200 Calculus of kidney: Secondary | ICD-10-CM | POA: Diagnosis not present

## 2024-01-13 DIAGNOSIS — K219 Gastro-esophageal reflux disease without esophagitis: Secondary | ICD-10-CM | POA: Diagnosis not present

## 2024-01-13 DIAGNOSIS — Z8546 Personal history of malignant neoplasm of prostate: Secondary | ICD-10-CM | POA: Diagnosis not present

## 2024-01-13 DIAGNOSIS — D631 Anemia in chronic kidney disease: Secondary | ICD-10-CM | POA: Diagnosis not present

## 2024-01-13 DIAGNOSIS — W19XXXD Unspecified fall, subsequent encounter: Secondary | ICD-10-CM | POA: Diagnosis not present

## 2024-01-20 ENCOUNTER — Other Ambulatory Visit: Payer: Self-pay | Admitting: Family Medicine

## 2024-01-21 ENCOUNTER — Other Ambulatory Visit: Payer: Self-pay | Admitting: Family Medicine

## 2024-02-02 ENCOUNTER — Other Ambulatory Visit: Payer: Self-pay | Admitting: Infectious Disease

## 2024-02-08 ENCOUNTER — Other Ambulatory Visit: Payer: Self-pay | Admitting: Family Medicine

## 2024-02-08 NOTE — Telephone Encounter (Signed)
 Copied from CRM [id number]. Topic: Clinical - Medication Refill >> Feb 08, 2024 11:39 AM Henretta I wrote: Medication: buPROPion  (WELLBUTRIN  XL) 150 MG 24 hr tablet  Has the patient contacted their pharmacy? Yes, pharm stated they put in request but have heard nothing back  (Agent: If no, request that the patient contact the pharmacy for the refill. If patient does not wish to contact the pharmacy document the reason why and proceed with request.) (Agent: If yes, when and what did the pharmacy advise?)  This is the patient's preferred pharmacy:  [id number] [place], [place] - [address] AT [facility] [place] [address] [facility] [address] [facility] [address] [place] [address] Phone: [phone] Fax: [phone]  Is this the correct pharmacy for this prescription? Yes If no, delete pharmacy and type the correct one.   Has the prescription been filled recently? No  Is the patient out of the medication? Yes  Has the patient been seen for an appointment in the last year OR does the patient have an upcoming appointment? Yes  Can we respond through MyChart? Yes  Agent: Please be advised that Rx refills may take up to 3 business days. We ask that you follow-up with your pharmacy.

## 2024-02-09 ENCOUNTER — Other Ambulatory Visit: Payer: Self-pay | Admitting: Family Medicine

## 2024-02-12 ENCOUNTER — Telehealth: Payer: Self-pay | Admitting: Family Medicine

## 2024-02-12 MED ORDER — BUPROPION HCL ER (XL) 150 MG PO TB24: 150.0000 mg | ORAL_TABLET | Freq: Every day | ORAL | 0 refills | Status: DC

## 2024-02-12 NOTE — Telephone Encounter (Signed)
 Copied from CRM [id number]. Topic: Clinical - Prescription Issue >> Feb 12, 2024 10:12 AM Tiffini S wrote: Reason for CRM: Patient requested a medication refill on 02/08/24, patient is out of the medication for a week and have been taking the medication without any problems. Patient is asking for a call back at [phone].

## 2024-02-12 NOTE — Telephone Encounter (Signed)
 Medication has been sent.

## 2024-02-26 ENCOUNTER — Other Ambulatory Visit: Payer: Self-pay | Admitting: Family Medicine

## 2024-03-04 ENCOUNTER — Other Ambulatory Visit: Payer: Self-pay | Admitting: Family Medicine

## 2024-03-07 ENCOUNTER — Other Ambulatory Visit: Payer: Self-pay

## 2024-03-08 ENCOUNTER — Telehealth: Payer: Self-pay

## 2024-03-08 NOTE — Telephone Encounter (Signed)
 Copied from CRM #[id number]. Topic: Clinical - Medication Question >> Mar 08, 2024 11:32 AM Henretta I wrote: Reason for CRM: Patient would like know if he needs to continue to take Topiramate  200mg  for migraines or if he can come off this medication

## 2024-03-11 NOTE — Telephone Encounter (Signed)
 Last pt was getting prescription from another provider it appears that this medication was discontinued several months ago.  If this is not the case,can wean medication by taking half.

## 2024-03-15 NOTE — Telephone Encounter (Signed)
 Spoke with patient, patient seem to be having side effects from not taking the medication, heavy head. Patient is sch for Friday 8/15

## 2024-03-18 ENCOUNTER — Ambulatory Visit (INDEPENDENT_AMBULATORY_CARE_PROVIDER_SITE_OTHER): Payer: Self-pay | Admitting: Family Medicine

## 2024-03-18 ENCOUNTER — Encounter: Payer: Self-pay | Admitting: Family Medicine

## 2024-03-18 VITALS — BP 110/64 | HR 75 | Temp 98.5°F | Ht 68.0 in | Wt 169.2 lb

## 2024-03-18 DIAGNOSIS — R351 Nocturia: Secondary | ICD-10-CM | POA: Diagnosis not present

## 2024-03-18 DIAGNOSIS — Z8669 Personal history of other diseases of the nervous system and sense organs: Secondary | ICD-10-CM

## 2024-03-18 DIAGNOSIS — E538 Deficiency of other specified B group vitamins: Secondary | ICD-10-CM | POA: Diagnosis not present

## 2024-03-18 DIAGNOSIS — R339 Retention of urine, unspecified: Secondary | ICD-10-CM | POA: Diagnosis not present

## 2024-03-18 DIAGNOSIS — Z87442 Personal history of urinary calculi: Secondary | ICD-10-CM

## 2024-03-18 DIAGNOSIS — N1831 Chronic kidney disease, stage 3a: Secondary | ICD-10-CM

## 2024-03-18 DIAGNOSIS — Z9079 Acquired absence of other genital organ(s): Secondary | ICD-10-CM | POA: Diagnosis not present

## 2024-03-18 DIAGNOSIS — Z8546 Personal history of malignant neoplasm of prostate: Secondary | ICD-10-CM

## 2024-03-18 DIAGNOSIS — E782 Mixed hyperlipidemia: Secondary | ICD-10-CM

## 2024-03-18 LAB — POCT URINALYSIS DIPSTICK
Bilirubin, UA: POSITIVE
Blood, UA: NEGATIVE
Glucose, UA: NEGATIVE
Ketones, UA: NEGATIVE
Leukocytes, UA: NEGATIVE
Nitrite, UA: NEGATIVE
Protein, UA: POSITIVE — AB
Spec Grav, UA: 1.025 (ref 1.010–1.025)
Urobilinogen, UA: 0.2 U/dL
pH, UA: 6 (ref 5.0–8.0)

## 2024-03-18 LAB — CBC WITH DIFFERENTIAL/PLATELET
Basophils Absolute: 0 K/uL (ref 0.0–0.1)
Basophils Relative: 0.6 % (ref 0.0–3.0)
Eosinophils Absolute: 0.1 K/uL (ref 0.0–0.7)
Eosinophils Relative: 1.6 % (ref 0.0–5.0)
HCT: 41.1 % (ref 39.0–52.0)
Hemoglobin: 13.5 g/dL (ref 13.0–17.0)
Lymphocytes Relative: 26.3 % (ref 12.0–46.0)
Lymphs Abs: 1.5 K/uL (ref 0.7–4.0)
MCHC: 32.8 g/dL (ref 30.0–36.0)
MCV: 97.9 fl (ref 78.0–100.0)
Monocytes Absolute: 0.5 K/uL (ref 0.1–1.0)
Monocytes Relative: 9.6 % (ref 3.0–12.0)
Neutro Abs: 3.5 K/uL (ref 1.4–7.7)
Neutrophils Relative %: 61.9 % (ref 43.0–77.0)
Platelets: 205 K/uL (ref 150.0–400.0)
RBC: 4.2 Mil/uL — ABNORMAL LOW (ref 4.22–5.81)
RDW: 13.9 % (ref 11.5–15.5)
WBC: 5.6 K/uL (ref 4.0–10.5)

## 2024-03-18 LAB — LIPID PANEL
Cholesterol: 132 mg/dL (ref 0–200)
HDL: 40.9 mg/dL (ref >39.00)
LDL Cholesterol: 63 mg/dL (ref 0–99)
NonHDL: 91.12
Total CHOL/HDL Ratio: 3
Triglycerides: 143 mg/dL (ref 0.0–149.0)
VLDL: 28.6 mg/dL (ref 0.0–40.0)

## 2024-03-18 LAB — COMPREHENSIVE METABOLIC PANEL WITH GFR
ALT: 20 U/L (ref 0–53)
AST: 24 U/L (ref 0–37)
Albumin: 4.6 g/dL (ref 3.5–5.2)
Alkaline Phosphatase: 37 U/L — ABNORMAL LOW (ref 39–117)
BUN: 25 mg/dL — ABNORMAL HIGH (ref 6–23)
CO2: 28 meq/L (ref 19–32)
Calcium: 9.9 mg/dL (ref 8.4–10.5)
Chloride: 103 meq/L (ref 96–112)
Creatinine, Ser: 1.61 mg/dL — ABNORMAL HIGH (ref 0.40–1.50)
GFR: 41.15 mL/min — ABNORMAL LOW (ref >60.00)
Glucose, Bld: 93 mg/dL (ref 70–99)
Potassium: 4.5 meq/L (ref 3.5–5.1)
Sodium: 139 meq/L (ref 135–145)
Total Bilirubin: 0.5 mg/dL (ref 0.2–1.2)
Total Protein: 7.6 g/dL (ref 6.0–8.3)

## 2024-03-18 LAB — URINALYSIS, MICROSCOPIC ONLY: RBC / HPF: NONE SEEN (ref 0–?)

## 2024-03-18 LAB — VITAMIN B12: Vitamin B-12: >1500 pg/mL — ABNORMAL HIGH (ref 211–911)

## 2024-03-18 MED ORDER — TAMSULOSIN HCL 0.4 MG PO CAPS: 0.4000 mg | ORAL_CAPSULE | Freq: Every day | ORAL | 1 refills | Status: AC

## 2024-03-18 NOTE — Progress Notes (Signed)
 Established Patient Office Visit   Subjective  Patient ID: Luis Villegas, male    DOB: 1946-04-08  Age: 78 y.o. MRN: [id number]  Chief Complaint  Patient presents with   Medical Management of Chronic Issues    Patient came in today for medication follow-up, patient has not been feeling well since stopping Topamax , light headed and urine leaking, patient would like to have labs done      Pt is a 78 year old male seen for follow-up.  Patient stopped taking Topamax  200 mg daily.  Was on med x yrs for h/o migraines.  Pt was urged to d/c med by his urologist as it could cause renal calculi.  Pt with several kidney stones.  S/p prostatectomy and XRT for h/o prostate cancer.  Pt notes increased noturia, incomplete bladder emptying, urine leakage.  Patient notes feeling like ears are congested, continued balance issues, and increased appetite.  Unsure if symptoms are related to stopping Topamax .  Walking at home and doing chair exercises to help with balance.  Pt taking vitamin B12 daily.  Inquires about rechecking levels and other labs.    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   Chronic kidney disease (CKD), stage III (moderate) (HCC) 04/15/2019   VENTRAL HERNIA 11/23/2008   History of colonic polyps 11/23/2008   HYPERLIPIDEMIA 11/11/2007   GERD 11/11/2007   Sleep apnea 11/11/2007   PROSTATE CANCER, HX OF 11/11/2007   ANXIETY STATE NOS 05/18/2007   MIGRAINE NEC W/INTRACTABLE MIGRAINE 03/23/2007   ANEMIA-NOS 09/23/2006   Past Medical History:  Diagnosis Date   Allergy    Arthritis    Cataract    Colon polyps 2016   Depression     GERD (gastroesophageal reflux disease)    Heart murmur    Hyperlipidemia    IBS (irritable bowel syndrome)    Kidney stones    stage 3    Memory loss    Prostate cancer (HCC) 2003   sx 2003 and radation   Sleep apnea    no cpap per pt   Sleep apnea with use of continuous positive airway pressure (CPAP)    Past Surgical History:  Procedure Laterality Date   CARPAL TUNNEL RELEASE Bilateral    CERVICAL FUSION     COLONOSCOPY  2007, 08/09/2014   [facility]/ 2016 [place]   COLONOSCOPY W/ POLYPECTOMY  2002   HERNIA REPAIR     POLYPECTOMY  2016   polyps x3 T.A.   PROSTATECTOMY  2003   Dr.Davis   TONSILLECTOMY     TRANSESOPHAGEAL ECHOCARDIOGRAM (CATH LAB) N/A 07/22/2023   Procedure: TRANSESOPHAGEAL ECHOCARDIOGRAM;  Surgeon: Delford Maude BROCKS, MD;  Location: [facility] INVASIVE CV LAB;  Service: Cardiovascular;  Laterality: N/A;   Social History   Tobacco Use   Smoking status: Former    Current packs/day: 0.00    Types: Cigarettes    Quit date: 08/04/1993    Years since quitting: 30.6   Smokeless tobacco: Never  Vaping Use   Vaping status: Never Used  Substance Use Topics   Alcohol use: No   Drug use: No   Family History  Problem Relation Age of Onset  Stroke Father 11   Bladder Cancer Father    Breast cancer Mother    Diabetes Mother    Neuropathy Mother    Valvular heart disease Mother    Heart attack Mother 80   Cancer Mother    Prostate cancer Brother    Heart disease Brother    Heart attack Brother    Prostate cancer Brother    Colon cancer Neg Hx    Colon polyps Neg Hx    Esophageal cancer Neg Hx    Stomach cancer Neg Hx    Rectal cancer Neg Hx    No Known Allergies  ROS Negative unless stated above    Objective:     BP 110/64 (BP Location: Left Arm, Patient Position: Sitting, Cuff Size: Normal)   Pulse 75   Temp 98.5 F (36.9 C) (Oral)   Ht 5' 8 (1.727 m)   Wt 169 lb 3.2 oz (76.7 kg)   SpO2 97%   BMI 25.73 kg/m  BP Readings from Last 3  Encounters:  03/18/24 110/64  12/30/23 120/64  09/30/23 110/64   Wt Readings from Last 3 Encounters:  03/18/24 169 lb 3.2 oz (76.7 kg)  12/30/23 167 lb 12.8 oz (76.1 kg)  09/30/23 159 lb (72.1 kg)      Physical Exam Constitutional:      General: He is not in acute distress.    Appearance: Normal appearance.  HENT:     Head: Normocephalic and atraumatic.     Nose: Nose normal.     Mouth/Throat:     Mouth: Mucous membranes are moist.  Cardiovascular:     Rate and Rhythm: Normal rate and regular rhythm.     Heart sounds: Normal heart sounds. No murmur heard.    No gallop.  Pulmonary:     Effort: Pulmonary effort is normal. No respiratory distress.     Breath sounds: Normal breath sounds. No wheezing, rhonchi or rales.  Abdominal:     General: Bowel sounds are normal.     Palpations: Abdomen is soft.     Tenderness: There is no abdominal tenderness.  Skin:    General: Skin is warm and dry.  Neurological:     Mental Status: He is alert and oriented to person, place, and time.        12/30/2023    2:52 PM 09/30/2023    2:09 PM 06/01/2023    3:38 PM  Depression screen PHQ 2/9  Decreased Interest 1 1 0  Down, Depressed, Hopeless 0 0 0  PHQ - 2 Score 1 1 0  Altered sleeping 0 0   Tired, decreased energy 1 0   Change in appetite 0 0   Feeling bad or failure about yourself  0 0   Trouble concentrating 0 0   Moving slowly or fidgety/restless 0 0   Suicidal thoughts 0 0   PHQ-9 Score 2 1   Difficult doing work/chores Not difficult at all Not difficult at all       12/30/2023    2:52 PM 09/30/2023    2:10 PM 10/15/2022    1:27 PM 09/01/2022    2:00 PM  GAD 7 : Generalized Anxiety Score  Nervous, Anxious, on Edge 0 0 0 0  Control/stop worrying 0 0 0 0  Worry too much - different things 0 0 0 0  Trouble relaxing 0 0 0 0  Restless 0 0 0 0  Easily annoyed or irritable 0 0 0 0  Afraid -  awful might happen 0 0 0 0  Total GAD 7 Score 0 0 0 0  Anxiety Difficulty Not  difficult at all Not difficult at all Not difficult at all Not difficult at all     Results for orders placed or performed in visit on 03/18/24  POCT urinalysis dipstick  Result Value Ref Range   Color, UA yellow    Clarity, UA clear    Glucose, UA Negative Negative   Bilirubin, UA pos    Ketones, UA neg    Spec Grav, UA 1.025 1.010 - 1.025   Blood, UA neg    pH, UA 6.0 5.0 - 8.0   Protein, UA Positive (A) Negative   Urobilinogen, UA 0.2 0.2 or 1.0 E.U./dL   Nitrite, UA neg    Leukocytes, UA Negative Negative   Appearance     Odor        Assessment & Plan:   Incomplete bladder emptying -     POCT urinalysis dipstick -     Urine Microscopic -     Tamsulosin  HCl; Take 1 capsule (0.4 mg total) by mouth daily.  Dispense: 30 capsule; Refill: 1  History of renal calculi -     POCT urinalysis dipstick -     Urine Microscopic -     Tamsulosin  HCl; Take 1 capsule (0.4 mg total) by mouth daily.  Dispense: 30 capsule; Refill: 1  Nocturia -     Urine Microscopic  S/P prostatectomy  History of prostate cancer  Mixed hyperlipidemia -     Lipid panel; Future  Stage 3a chronic kidney disease (HCC) -     Comprehensive metabolic panel with GFR; Future  Vitamin B12 deficiency -     CBC with Differential/Platelet; Future -     Vitamin B12; Future -     Iron, TIBC and Ferritin Panel; Future  History of migraine -     Iron, TIBC and Ferritin Panel; Future  Patient with ongoing urinary concerns.  Discussed possibility of UTI, renal calculi or other cause of obstruction as status post prostatectomy and XRT for history of prostate cancer.  POC UA negative.  Will obtain micro and labs.  Start Flomax  to help with urine stream.  Patient encouraged to keep upcoming appointment with urology.  URI-like symptoms and increased appetite likely 2/2 stopping Topamax .  Discussed supportive care.  Continue to monitor.  PT/vestibular rehab offered given patient's history of feeling off balance.   Patient wishes to wait at this time.  Will continue chair exercises and walking at home.  I personally spent a total of 43 minutes in the care of the patient today including getting/reviewing separately obtained history, performing a medically appropriate exam/evaluation, counseling and educating, placing orders, documenting clinical information in the EHR, independently interpreting results, communicating results, and coordinating care.  Return if symptoms worsen or fail to improve.   Clotilda JONELLE Single, MD

## 2024-03-18 NOTE — Patient Instructions (Signed)
 Happy early birthday!!  A prescription for Flomax  was sent to your pharmacy.  This is a medication to help increase the amount of urine output so that you hopefully are not having the frequency issues and if there is any stone present it will help so that you are able to pass it.  Keep your upcoming appointment with urology.  If you feel like your symptoms are becoming worse, you develop fever, chills, nausea, vomiting, etc. notify clinic.  If you notice your balance symptoms are becoming worse let us  know so that we can place a referral to physical therapy/vestibular rehab to help with your symptoms.

## 2024-03-19 LAB — IRON,TIBC AND FERRITIN PANEL
%SAT: 32 % (ref 20–48)
Ferritin: 27 ng/mL (ref 24–380)
Iron: 122 ug/dL (ref 50–180)
TIBC: 378 ug/dL (ref 250–425)

## 2024-03-22 ENCOUNTER — Encounter: Payer: Self-pay | Admitting: Family Medicine

## 2024-03-23 ENCOUNTER — Other Ambulatory Visit: Payer: Self-pay | Admitting: Family Medicine

## 2024-03-23 ENCOUNTER — Ambulatory Visit: Payer: Self-pay | Admitting: Family Medicine

## 2024-03-23 DIAGNOSIS — N179 Acute kidney failure, unspecified: Secondary | ICD-10-CM

## 2024-04-01 ENCOUNTER — Other Ambulatory Visit

## 2024-04-01 DIAGNOSIS — N179 Acute kidney failure, unspecified: Secondary | ICD-10-CM | POA: Diagnosis not present

## 2024-04-01 LAB — BASIC METABOLIC PANEL WITH GFR
BUN: 25 mg/dL — ABNORMAL HIGH (ref 6–23)
CO2: 29 meq/L (ref 19–32)
Calcium: 9.7 mg/dL (ref 8.4–10.5)
Chloride: 103 meq/L (ref 96–112)
Creatinine, Ser: 1.46 mg/dL (ref 0.40–1.50)
GFR: 46.26 mL/min — ABNORMAL LOW (ref >60.00)
Glucose, Bld: 87 mg/dL (ref 70–99)
Potassium: 4.5 meq/L (ref 3.5–5.1)
Sodium: 140 meq/L (ref 135–145)

## 2024-04-07 ENCOUNTER — Ambulatory Visit: Payer: Self-pay | Admitting: Family Medicine

## 2024-04-09 ENCOUNTER — Other Ambulatory Visit: Payer: Self-pay | Admitting: Family Medicine

## 2024-04-15 DIAGNOSIS — N210 Calculus in bladder: Secondary | ICD-10-CM | POA: Diagnosis not present

## 2024-04-15 DIAGNOSIS — C61 Malignant neoplasm of prostate: Secondary | ICD-10-CM | POA: Diagnosis not present

## 2024-04-25 DIAGNOSIS — R350 Frequency of micturition: Secondary | ICD-10-CM | POA: Diagnosis not present

## 2024-04-25 DIAGNOSIS — C61 Malignant neoplasm of prostate: Secondary | ICD-10-CM | POA: Diagnosis not present

## 2024-04-25 DIAGNOSIS — R3915 Urgency of urination: Secondary | ICD-10-CM | POA: Diagnosis not present

## 2024-04-25 DIAGNOSIS — Z87440 Personal history of urinary (tract) infections: Secondary | ICD-10-CM | POA: Diagnosis not present

## 2024-05-08 ENCOUNTER — Other Ambulatory Visit: Payer: Self-pay | Admitting: Family Medicine

## 2024-05-09 DIAGNOSIS — Z23 Encounter for immunization: Secondary | ICD-10-CM | POA: Diagnosis not present

## 2024-05-18 ENCOUNTER — Ambulatory Visit

## 2024-06-06 ENCOUNTER — Encounter: Payer: Self-pay | Admitting: Radiology

## 2024-06-10 ENCOUNTER — Other Ambulatory Visit: Payer: Self-pay | Admitting: Family Medicine

## 2024-06-13 NOTE — Telephone Encounter (Signed)
**Note De-identified  [place] Obfuscation** Please advise 

## 2024-06-24 ENCOUNTER — Other Ambulatory Visit: Payer: Self-pay | Admitting: Family Medicine

## 2024-08-24 ENCOUNTER — Ambulatory Visit

## 2024-08-24 VITALS — BP 120/60 | HR 77 | Temp 98.4°F | Ht 68.0 in | Wt 174.2 lb

## 2024-08-24 DIAGNOSIS — Z0000 Encounter for general adult medical examination without abnormal findings: Secondary | ICD-10-CM | POA: Diagnosis not present

## 2024-08-24 NOTE — Patient Instructions (Addendum)
 Luis Villegas,  Thank you for taking the time for your Medicare Wellness Visit. I appreciate your continued commitment to your health goals. Please review the care plan we discussed, and feel free to reach out if I can assist you further.  Please note that Annual Wellness Visits do not include a physical exam. Some assessments may be limited, especially if the visit was conducted virtually. If needed, we may recommend an in-person follow-up with your provider.  Ongoing Care Seeing your primary care provider every 3 to 6 months helps us  monitor your health and provide consistent, personalized care.   Referrals If a referral was made during today's visit and you haven't received any updates within two weeks, please contact the referred provider directly to check on the status.  Recommended Screenings:  Health Maintenance  Topic Date Due   Zoster (Shingles) Vaccine (1 of 2) 03/24/1965   COVID-19 Vaccine (5 - 2025-26 season) 04/04/2024   Colon Cancer Screening  03/18/2025*   Medicare Annual Wellness Visit  08/24/2025   DTaP/Tdap/Td vaccine (3 - Td or Tdap) 05/04/2028   Pneumococcal Vaccine for age over 69  Completed   Flu Shot  Completed   Hepatitis C Screening  Completed   Meningitis B Vaccine  Aged Out  *Topic was postponed. The date shown is not the original due date.       08/24/2024    1:38 PM  Advanced Directives  Does Patient Have a Medical Advance Directive? No  Would patient like information on creating a medical advance directive? No - Patient declined    Vision: Annual vision screenings are recommended for early detection of glaucoma, cataracts, and diabetic retinopathy. These exams can also reveal signs of chronic conditions such as diabetes and high blood pressure.  Dental: Annual dental screenings help detect early signs of oral cancer, gum disease, and other conditions linked to overall health, including heart disease and diabetes.  Please see the attached documents  for additional preventive care recommendations.

## 2024-08-24 NOTE — Progress Notes (Signed)
 "  Chief Complaint  Patient presents with   Medicare Wellness     Subjective:   Luis Villegas is a 79 y.o. male who presents for a Medicare Annual Wellness Visit.  Visit info / Clinical Intake: Medicare Wellness Visit Type:: Subsequent Annual Wellness Visit Persons participating in visit and providing information:: patient Medicare Wellness Visit Mode:: In-person (required for WTM) Interpreter Needed?: No AWV questionnaire completed by patient prior to visit?: yes Date:: 08/17/24 Living arrangements:: lives with spouse/significant other Patient's Overall Health Status Rating: (!) fair Typical amount of pain: some Does pain affect daily life?: (!) yes (Patient ststed no medical attention needed) Are you currently prescribed opioids?: no  Dietary Habits and Nutritional Risks How many meals a day?: 2 Eats fruit and vegetables daily?: (!) no Most meals are obtained by: preparing own meals In the last 2 weeks, have you had any of the following?: none Diabetic:: no  Functional Status Activities of Daily Living (to include ambulation/medication): Independent Ambulation: Independent with device- listed below Home Assistive Devices/Equipment: Other (Comment) (Hearing Aids) Medication Administration: Independent Home Management (perform basic housework or laundry): Independent Manage your own finances?: (!) no (Wife manage) Primary transportation is: driving Concerns about vision?: no *vision screening is required for WTM* Concerns about hearing?: (!) yes Uses hearing aids?: (!) yes Hear whispered voice?: (!) no *in-person visit only*  Fall Screening Falls in the past year?: 1 Number of falls in past year: 0 Was there an injury with Fall?: 0 Fall Risk Category Calculator: 1 Patient Fall Risk Level: Low Fall Risk  Fall Risk Patient at Risk for Falls Due to: Impaired balance/gait Fall risk Follow up: Falls evaluation completed  Home and Transportation Safety: All  rugs have non-skid backing?: yes All stairs or steps have railings?: yes Grab bars in the bathtub or shower?: yes Have non-skid surface in bathtub or shower?: yes Good home lighting?: yes Regular seat belt use?: yes Hospital stays in the last year:: no  Cognitive Assessment Difficulty concentrating, remembering, or making decisions? : yes Will 6CIT or Mini Cog be Completed: yes What year is it?: 0 points What month is it?: 0 points Give patient an address phrase to remember (5 components): [address] [place]  About what time is it?: 0 points Count backwards from 20 to 1: 0 points Say the months of the year in reverse: 0 points Repeat the address phrase from earlier: 0 points 6 CIT Score: 0 points  Advance Directives (For Healthcare) Does Patient Have a Medical Advance Directive?: No Would patient like information on creating a medical advance directive?: No - Patient declined  Reviewed/Updated  Reviewed/Updated: Reviewed All (Medical, Surgical, Family, Medications, Allergies, Care Teams, Patient Goals)    Allergies (verified) Patient has no known allergies.   Current Medications (verified) Outpatient Encounter Medications as of 08/24/2024  Medication Sig   acyclovir  (ZOVIRAX ) 200 MG capsule TAKE 1 CAPSULE(200 MG) BY MOUTH DAILY   azelastine (OPTIVAR) 0.05 % ophthalmic solution Apply 1 drop to eye 2 (two) times daily as needed.   buPROPion  (WELLBUTRIN  XL) 150 MG 24 hr tablet TAKE 1 TABLET(150 MG) BY MOUTH DAILY   citalopram  (CELEXA ) 20 MG tablet TAKE 1 TABLET(20 MG) BY MOUTH DAILY   ezetimibe -simvastatin  (VYTORIN ) 10-40 MG tablet Take 1 tablet by mouth at bedtime.   fexofenadine  (ALLEGRA ) 180 MG tablet Take 1 tablet (180 mg total) by mouth daily as needed for allergies or rhinitis (congestion, watery eyes, sneezing). (Patient not taking: Reported on 08/24/2024)   loperamide  (  IMODIUM  A-D) 2 MG tablet Use a 1/2 tablet every morning and titrate as needed.   Multiple  Vitamin (MULTIVITAMIN ADULT PO) Take by mouth.   omeprazole  (PRILOSEC) 20 MG capsule TAKE 1 CAPSULE BY MOUTH EVERY DAY   SUMAtriptan  (IMITREX ) 100 MG tablet TAKE 1 TABLET BY MOUTH AS NEEDED FOR HEADACHE   tamsulosin  (FLOMAX ) 0.4 MG CAPS capsule Take 1 capsule (0.4 mg total) by mouth daily.   topiramate  (TOPAMAX ) 200 MG tablet Take by mouth daily. (Patient not taking: Reported on 03/18/2024)   No facility-administered encounter medications on file as of 08/24/2024.    History: Past Medical History:  Diagnosis Date   Allergy    Arthritis    Cataract    Colon polyps 2016   Depression    GERD (gastroesophageal reflux disease)    Heart murmur    Hyperlipidemia    IBS (irritable bowel syndrome)    Kidney stones    stage 3    Memory loss    Prostate cancer (HCC) 2003   sx 2003 and radation   Sleep apnea    no cpap per pt   Sleep apnea with use of continuous positive airway pressure (CPAP)    Past Surgical History:  Procedure Laterality Date   CARPAL TUNNEL RELEASE Bilateral    CERVICAL FUSION     COLONOSCOPY  2007, 08/09/2014   2007 [facility]/ 2016 [place]   COLONOSCOPY W/ POLYPECTOMY  2002   HERNIA REPAIR     POLYPECTOMY  2016   polyps x3 T.A.   PROSTATECTOMY  2003   Dr.Davis   TONSILLECTOMY     TRANSESOPHAGEAL ECHOCARDIOGRAM (CATH LAB) N/A 07/22/2023   Procedure: TRANSESOPHAGEAL ECHOCARDIOGRAM;  Surgeon: Delford Maude BROCKS, MD;  Location: [facility] INVASIVE CV LAB;  Service: Cardiovascular;  Laterality: N/A;   Family History  Problem Relation Age of Onset   Stroke Father 62   Bladder Cancer Father    Breast cancer Mother    Diabetes Mother    Neuropathy Mother    Valvular heart disease Mother    Heart attack Mother 43   Cancer Mother    Prostate cancer Brother    Heart disease Brother    Heart attack Brother    Prostate cancer Brother    Colon cancer Neg Hx    Colon polyps Neg Hx    Esophageal cancer Neg Hx    Stomach cancer Neg Hx    Rectal cancer Neg Hx    Social  History   Occupational History   Occupation: Retired  Tobacco Use   Smoking status: Former    Current packs/day: 0.00    Types: Cigarettes    Quit date: 08/04/1993    Years since quitting: 31.0   Smokeless tobacco: Never  Vaping Use   Vaping status: Never Used  Substance and Sexual Activity   Alcohol use: Not Currently   Drug use: Never   Sexual activity: Yes   Tobacco Counseling Counseling given: No  SDOH Screenings   Food Insecurity: No Food Insecurity (08/24/2024)  Housing: Unknown (08/24/2024)  Transportation Needs: No Transportation Needs (08/24/2024)  Utilities: Not At Risk (08/24/2024)  Depression (PHQ2-9): Low Risk (08/24/2024)  Financial Resource Strain: Low Risk (08/17/2024)  Physical Activity: Insufficiently Active (08/24/2024)  Social Connections: Moderately Isolated (08/24/2024)  Stress: No Stress Concern Present (08/24/2024)  Tobacco Use: Medium Risk (08/24/2024)  Health Literacy: Adequate Health Literacy (08/24/2024)   See flowsheets for full screening details  Depression Screen PHQ 2 & 9 Depression Scale- Over the  past 2 weeks, how often have you been bothered by any of the following problems? Little interest or pleasure in doing things: 0 Feeling down, depressed, or hopeless (PHQ Adolescent also includes...irritable): 0 PHQ-2 Total Score: 0 Trouble falling or staying asleep, or sleeping too much: 1 Feeling tired or having little energy: 1 Poor appetite or overeating (PHQ Adolescent also includes...weight loss): 1 Feeling bad about yourself - or that you are a failure or have let yourself or your family down: 0 Trouble concentrating on things, such as reading the newspaper or watching television (PHQ Adolescent also includes...like school work): 0 Moving or speaking so slowly that other people could have noticed. Or the opposite - being so fidgety or restless that you have been moving around a lot more than usual: 0 Thoughts that you would be better off dead, or of  hurting yourself in some way: 0 PHQ-9 Total Score: 3 If you checked off any problems, how difficult have these problems made it for you to do your work, take care of things at home, or get along with other people?: Not difficult at all     Goals Addressed               This Visit's Progress     Continue physical activity (pt-stated)        Remain active!             Objective:    Today's Vitals   08/24/24 1322  BP: 120/60  Pulse: 77  Temp: 98.4 F (36.9 C)  TempSrc: Oral  SpO2: 94%  Weight: 174 lb 3.2 oz (79 kg)  Height: 5' 8 (1.727 m)   Body mass index is 26.49 kg/m.  Hearing/Vision screen Hearing Screening - Comments:: Wears Hearing Aids Vision Screening - Comments:: Wears rx glasses - up to date with routine eye exams with  [facility] Immunizations and Health Maintenance Health Maintenance  Topic Date Due   Zoster Vaccines- Shingrix (1 of 2) 03/24/1966   COVID-19 Vaccine (5 - 2025-26 season) 04/04/2024   Colonoscopy  03/18/2025 (Originally 12/21/2022)   Medicare Annual Wellness (AWV)  08/24/2025   DTaP/Tdap/Td (3 - Td or Tdap) 05/04/2028   Pneumococcal Vaccine: 50+ Years  Completed   Influenza Vaccine  Completed   Hepatitis C Screening  Completed   Meningococcal B Vaccine  Aged Out        Assessment/Plan:  This is a routine wellness examination for Jaciel.  Patient Care Team: Mercer Clotilda SAUNDERS, MD as PCP - General (Family Medicine) Livingston Rigg, MD as Consulting Physician (Dermatology)  I have personally reviewed and noted the following in the patients chart:   Medical and social history Use of alcohol, tobacco or illicit drugs  Current medications and supplements including opioid prescriptions. Functional ability and status Nutritional status Physical activity Advanced directives List of other physicians Hospitalizations, surgeries, and ER visits in previous 12 months Vitals Screenings to include cognitive, depression, and  falls Referrals and appointments  No orders of the defined types were placed in this encounter.  In addition, I have reviewed and discussed with patient certain preventive protocols, quality metrics, and best practice recommendations. A written personalized care plan for preventive services as well as general preventive health recommendations were provided to patient.   Rojelio LELON Blush, LPN   8/78/7973   Return in 53 weeks (on 08/30/2025).  After Visit Summary: (In Person-Printed) AVS printed and given to the patient  Nurse Notes: No voiced or noted concerns at  this time "

## 2024-08-31 ENCOUNTER — Ambulatory Visit: Admitting: Family Medicine

## 2025-08-30 ENCOUNTER — Ambulatory Visit
